# Patient Record
Sex: Female | Born: 1990 | Hispanic: No | Marital: Married | Smoking: Never smoker
Health system: Southern US, Community
[De-identification: ages and names within clinical notes are randomized; demographics above are authoritative.]

## PROBLEM LIST (undated history)

## (undated) ENCOUNTER — Inpatient Hospital Stay (HOSPITAL_COMMUNITY): Payer: Self-pay

## (undated) ENCOUNTER — Emergency Department (HOSPITAL_BASED_OUTPATIENT_CLINIC_OR_DEPARTMENT_OTHER): Admission: EM | Payer: Medicaid Other

## (undated) DIAGNOSIS — D649 Anemia, unspecified: Secondary | ICD-10-CM

## (undated) DIAGNOSIS — E559 Vitamin D deficiency, unspecified: Secondary | ICD-10-CM

## (undated) HISTORY — PX: NO PAST SURGERIES: SHX2092

---

## 2015-02-08 ENCOUNTER — Emergency Department (HOSPITAL_COMMUNITY)
Admission: EM | Admit: 2015-02-08 | Discharge: 2015-02-08 | Disposition: A | Discharge status: Home / Self Care | Payer: Self-pay | Attending: Emergency Medicine | Admitting: Emergency Medicine

## 2015-02-08 ENCOUNTER — Encounter (HOSPITAL_COMMUNITY): Payer: Self-pay | Admitting: Emergency Medicine

## 2015-02-08 DIAGNOSIS — K120 Recurrent oral aphthae: Secondary | ICD-10-CM | POA: Insufficient documentation

## 2015-02-08 DIAGNOSIS — K068 Other specified disorders of gingiva and edentulous alveolar ridge: Secondary | ICD-10-CM | POA: Insufficient documentation

## 2015-02-08 DIAGNOSIS — Z862 Personal history of diseases of the blood and blood-forming organs and certain disorders involving the immune mechanism: Secondary | ICD-10-CM | POA: Insufficient documentation

## 2015-02-08 DIAGNOSIS — G4730 Sleep apnea, unspecified: Secondary | ICD-10-CM | POA: Insufficient documentation

## 2015-02-08 HISTORY — DX: Anemia, unspecified: D64.9

## 2015-02-08 MED ORDER — MAGIC MOUTHWASH W/LIDOCAINE: 5.0000 mL | Freq: Three times a day (TID) | ORAL | Status: DC | PRN

## 2015-02-08 NOTE — ED Provider Notes (Signed)
CSN: [id number]     Arrival date & time 02/08/15  1011 History  By signing my name below, I, [name], attest that this documentation has been prepared under the direction and in the presence of Glendell Docker, NP. Electronically Signed: [name], ED Scribe. 02/08/2015. 10:27 AM.    No chief complaint on file.  The history is provided by the patient and a relative. No language interpreter was used.    HPI Comments: Peggy Savage is a 24 y.o. female who presents to the Emergency Department complaining of worsening, moderate, right lower dental pain with onset a few days ago. Pt notes worsening pain when eating and further notes associated trouble sleeping due to the pain. She also notes an associated, mild, subjective fever. Her friends note she has been taking OTC pain medications which have provided no relief. She denies any known drug allergies. Pt denies any other associated symptoms.   No past medical history on file. No past surgical history on file. No family history on file. Social History  Substance Use Topics  . Smoking status: Not on file  . Smokeless tobacco: Not on file  . Alcohol Use: Not on file   OB History    No data available     Review of Systems  Constitutional: Positive for fever (subjective).  Neurological: Positive for headaches.  Psychiatric/Behavioral: Positive for sleep disturbance.  All other systems reviewed and are negative.  Allergies  Review of patient's allergies indicates not on file.  Home Medications   Prior to Admission medications   Not on File   There were no vitals taken for this visit. Physical Exam  Constitutional: She is oriented to person, place, and time. She appears well-developed and well-nourished.  HENT:  Head: Normocephalic and atraumatic.  Right Ear: External ear normal.  Left Ear: External ear normal.  Mouth/Throat: No oropharyngeal exudate.    Neck: Normal range of motion. No tracheal deviation  present.  Cardiovascular: Normal rate.   Pulmonary/Chest: Effort normal. No respiratory distress.  Abdominal: Soft. There is no tenderness.  Musculoskeletal: Normal range of motion.  Neurological: She is alert and oriented to person, place, and time.  Skin: Skin is warm and dry. She is not diaphoretic.  Psychiatric: She has a normal mood and affect. Her behavior is normal.  Nursing note and vitals reviewed.  ED Course  Procedures  COORDINATION OF CARE: 10:24 AM Pt presents today due to right lower dental pain. Discussed treatment plan with pt at bedside . Return precautions noted. Pt agreed to plan.  Labs Review Labs Reviewed - No data to display  Imaging Review No results found.   EKG Interpretation None      MDM   Final diagnoses:  Canker sores oral    Pt given magic mouthwash for symptomatic relief. No dental infection noted. No pta  I personally performed the services described in this documentation, which was scribed in my presence. The recorded information has been reviewed and is accurate.    Glendell Docker, NP 02/08/15 [place] Yao, MD 02/08/15 [phone]

## 2015-02-08 NOTE — ED Notes (Signed)
Translator stated, she's had a toothache since Friday.

## 2015-02-08 NOTE — Discharge Instructions (Signed)
Canker Sores °Canker sores are small, painful sores that develop inside your mouth. They may also be called aphthous ulcers. You can get canker sores on the inside of your lips or cheeks, on your tongue, or anywhere inside your mouth. You can have just one canker sore or several of them. Canker sores cannot be passed from one person to another (noncontagious). These sores are different than the sores that you may get on the outside of your lips (cold sores or fever blisters). °Canker sores usually start as painful red bumps. Then they turn into small white, yellow, or gray ulcers that have red borders. The ulcers may be quite painful. The pain may be worse when you eat or drink. °CAUSES °The cause of this condition is not known. °RISK FACTORS °This condition is more likely to develop in: °· Women. °· People in their teens or 20s. °· Women who are having their menstrual period. °· People who are under a lot of emotional stress. °· People who do not get enough iron or B vitamins. °· People who have poor oral hygiene. °· People who have an injury inside the mouth. This can happen after having dental work or from chewing something hard. °SYMPTOMS °Along with the canker sore, symptoms may also include: °· Fever. °· Fatigue. °· Swollen lymph nodes in your neck. °DIAGNOSIS °This condition can be diagnosed based on your symptoms. Your health care provider will also examine your mouth. Your health care provider may also do tests if you get canker sores often or if they are very bad. Tests may include: °· Blood tests to rule out other causes of canker sores. °· Taking swabs from the sore to check for infection. °· Taking a small piece of skin from the sore (biopsy) to test it for cancer. °TREATMENT °Most canker sores clear up without treatment in about 10 days. Home care is usually the only treatment that you will need. Over-the-counter medicines can relieve discomfort. If you have severe canker sores, your health care  provider may prescribe: °· Numbing ointment to relieve pain. °· Vitamins. °· Steroid medicines. These may be given as: °¨ Oral pills. °¨ Mouth rinses. °¨ Gels. °· Antibiotic mouth rinse. °HOME CARE INSTRUCTIONS °· Apply, take, or use medicines only as directed by your health care provider. These include vitamins. °· If you were prescribed an antibiotic mouth rinse, finish all of it even if you start to feel better. °· Until the sores are healed: °¨ Do not drink coffee or citrus juices. °¨ Do not eat spicy or salty foods. °· Use a mild, over-the-counter mouth rinse as directed by your health care provider. °· Practice good oral hygiene. °¨ Floss your teeth every day. °¨ Brush your teeth with a soft brush twice each day. °SEEK MEDICAL CARE IF: °· Your symptoms do not get better after two weeks. °· You also have a fever or swollen glands. °· You get canker sores often. °· You have a canker sore that is getting larger. °· You cannot eat or drink due to your canker sores. °  °This information is not intended to replace advice given to you by your health care provider. Make sure you discuss any questions you have with your health care provider. °  °Document Released: 07/30/2010 Document Revised: 08/19/2014 Document Reviewed: 03/05/2014 °Elsevier Interactive Patient Education ©2016 Elsevier Inc. ° °

## 2015-02-08 NOTE — ED Notes (Signed)
Declined W/C at D/C and was escorted to lobby by RN. 

## 2015-03-14 ENCOUNTER — Emergency Department (HOSPITAL_COMMUNITY)
Admission: EM | Admit: 2015-03-14 | Discharge: 2015-03-14 | Disposition: A | Discharge status: Home / Self Care | Payer: Self-pay | Attending: Emergency Medicine | Admitting: Emergency Medicine

## 2015-03-14 ENCOUNTER — Encounter (HOSPITAL_COMMUNITY): Payer: Self-pay | Admitting: *Deleted

## 2015-03-14 DIAGNOSIS — Z862 Personal history of diseases of the blood and blood-forming organs and certain disorders involving the immune mechanism: Secondary | ICD-10-CM | POA: Insufficient documentation

## 2015-03-14 DIAGNOSIS — N63 Unspecified lump in unspecified breast: Secondary | ICD-10-CM

## 2015-03-14 NOTE — ED Notes (Signed)
Noticed a mass in her right breast today and it makes her stressed per family member

## 2015-03-14 NOTE — Discharge Instructions (Signed)
Breast Self-Awareness  Practicing breast self-awareness may pick up problems early, prevent significant medical complications, and possibly save your life. By practicing breast self-awareness, you can become familiar with how your breasts look and feel and if your breasts are changing. This allows you to notice changes early. It can also offer you some reassurance that your breast health is good. One way to learn what is normal for your breasts and whether your breasts are changing is to do a breast self-exam.   If you find a lump or something that was not present in the past, it is best to contact your caregiver right away. Other findings that should be evaluated by your caregiver include nipple discharge, especially if it is bloody; skin changes or reddening; areas where the skin seems to be pulled in (retracted); or new lumps and bumps. Breast pain is seldom associated with cancer (malignancy), but should also be evaluated by a caregiver.  HOW TO PERFORM A BREAST SELF-EXAM  The best time to examine your breasts is 5-7 days after your menstrual period is over. During menstruation, the breasts are lumpier, and it may be more difficult to pick up changes. If you do not menstruate, have reached menopause, or had your uterus removed (hysterectomy), you should examine your breasts at regular intervals, such as monthly. If you are breastfeeding, examine your breasts after a feeding or after using a breast pump. Breast implants do not decrease the risk for lumps or tumors, so continue to perform breast self-exams as recommended. Talk to your caregiver about how to determine the difference between the implant and breast tissue. Also, talk about the amount of pressure you should use during the exam. Over time, you will become more familiar with the variations of your breasts and more comfortable with the exam. A breast self-exam requires you to remove all your clothes above the waist.  1.  Look at your breasts and  nipples. Stand in front of a mirror in a room with good lighting. With your hands on your hips, push your hands firmly downward. Look for a difference in shape, contour, and size from one breast to the other (asymmetry). Asymmetry includes puckers, dips, or bumps. Also, look for skin changes, such as reddened or scaly areas on the breasts. Look for nipple changes, such as discharge, dimpling, repositioning, or redness.  2. Carefully feel your breasts. This is best done either in the shower or tub while using soapy water or when flat on your back. Place the arm (on the side of the breast you are examining) above your head. Use the pads (not the fingertips) of your three middle fingers on your opposite hand to feel your breasts. Start in the underarm area and use  inch (2 cm) overlapping circles to feel your breast. Use 3 different levels of pressure (light, medium, and firm pressure) at each circle before moving to the next circle. The light pressure is needed to feel the tissue closest to the skin. The medium pressure will help to feel breast tissue a little deeper, while the firm pressure is needed to feel the tissue close to the ribs. Continue the overlapping circles, moving downward over the breast until you feel your ribs below your breast. Then, move one finger-width towards the center of the body. Continue to use the  inch (2 cm) overlapping circles to feel your breast as you move slowly up toward the collar bone (clavicle) near the base of the neck. Continue the up and down exam  using all 3 pressures until you reach the middle of the chest. Do this with each breast, carefully feeling for lumps or changes. 3.  Keep a written record with breast changes or normal findings for each breast. By writing this information down, you do not need to depend only on memory for size, tenderness, or location. Write down where you are in your menstrual cycle, if you are still menstruating. Breast tissue can have some  lumps or thick tissue. However, see your caregiver if you find anything that concerns you.  SEEK MEDICAL CARE IF:  You see a change in shape, contour, or size of your breasts or nipples.  You see skin changes, such as reddened or scaly areas on the breasts or nipples.  You have an unusual discharge from your nipples.  You feel a new lump or unusually thick areas.  This information is not intended to replace advice given to you by your health care provider. Make sure you discuss any questions you have with your health care provider.  Document Released: 04/04/2005 Document Revised: 03/21/2012 Document Reviewed: 07/20/2011  Elsevier Interactive Patient Education 2016 [employer].    Emergency Department Resource Guide 1) Find a Doctor and Pay Out of Pocket Although you won't have to find out who is covered by your insurance plan, it is a good idea to ask around and get recommendations. You will then need to call the office and see if the doctor you have chosen will accept you as a new patient and what types of options they offer for patients who are self-pay. Some doctors offer discounts or will set up payment plans for their patients who do not have insurance, but you will need to ask so you aren't surprised when you get to your appointment.  2) Contact Your Local Health Department Not all health departments have doctors that can see patients for sick visits, but many do, so it is worth a call to see if yours does. If you don't know where your local health department is, you can check in your phone book. The CDC also has a tool to help you locate your state's health department, and many state websites also have listings of all of their local health departments.  3) Find a [facility] If your illness is not likely to be very severe or complicated, you may want to try a walk in clinic. These are popping up all over the country in pharmacies, drugstores, and shopping centers. They're usually  staffed by nurse practitioners or physician assistants that have been trained to treat common illnesses and complaints. They're usually fairly quick and inexpensive. However, if you have serious medical issues or chronic medical problems, these are probably not your best option.  No Primary Care Doctor: - Call Health Connect at  [phone] - they can help you locate a primary care doctor that  accepts your insurance, provides certain services, etc. - Physician Referral Service- [phone]  Chronic Pain Problems: Organization         Address  Phone   Notes  [facility]  [phone] Patients need to be referred by their primary care doctor.   Medication Assistance: Organization         Address  Phone   Notes  [facility] [address] [place], [place] [address] [phone] --Must be a resident of [facility] -- Must have NO insurance coverage whatsoever (no Medicaid/ Medicare, etc.) -- The pt.  MUST have a primary care doctor that directs their care regularly and follows them in the community   MedAssist  [phone]   [employer]  [phone]    Agencies that provide inexpensive medical care: Organization         Address  Phone   Notes  [place]  [phone]   [facility]    [phone]   [facility] [place], [place] [address] [phone]   [place] [address] [place] [phone]   Planned Parenthood    [phone]   [facility]    [phone]   [place] and [place] [address], [place] Phone:  [phone], Fax:  [phone] Hours of Operation:  9 am - 6 pm, M-F.  Also accepts Medicaid/Medicare and self-pay.  [facility] for [address], [address], [place] Phone: [phone], Fax: [phone]. Hours of  Operation:  8:30 am - 5:30 pm, M-F.  Also accepts Medicaid and self-pay.  [facility] [place] [address] [place] Phone: [phone]   [place], [place], [place] [phone], Ext. [phone] Mondays & Thursdays: 7-9 AM.  First 15 patients are seen on a first come, first serve basis.    [place] Providers:  Organization         Address  Phone   Notes  [facility] [address], [place] [phone] Also accepts self-pay patients.  [facility] [id number] [place], [place]  [phone]   [place], [address], [place] [phone]   [facility] [address], [place] [phone]   Lucianne Lei [address], [place]   [phone] Only accepts [place] Access [place] patients after they have their name applied to their card.   Self-Pay (no insurance) in [facility]:  Organization         Address  Phone   Notes  Sickle Cell Patients, [facility] [place] [phone]   [facility] [place] [phone]   [facility] [place]  [place], [employer], [place] [phone]   [facility]/Dr. Osei-Bonsu  [address], [place] [address], [place] [phone] Phone number for both [place] and [place] locations is the same.  [facility] [address], [place] [phone]   [facility] [address] [place] or [address] [phone]   [facility] [address], [place] [phone], phone; [phone], fax Sees patients 1st and 3rd Saturday of every month.  Must not qualify for public or private insurance (i.e. Medicaid, Medicare,  [place] Health Choice, Veterans' Benefits)  Household income should be no more than 200% of the poverty level The clinic cannot treat you if you are pregnant or think you are pregnant  Sexually transmitted diseases are not treated at the clinic.    Dental Care: Organization         Address  Phone  Notes  [facility] [address]  Mardene Speak [phone] Accepts children up to age 47 who are enrolled in Medicaid or [place]; pregnant women with a Medicaid card; and children who have applied for Medicaid or [place] Health Choice, but were declined, whose parents can pay a reduced fee at time of service.  [facility] Department of [facility]  [address], [place] [phone] Accepts children up to age 18 who are enrolled in [place] or [place]; pregnant women with a Medicaid card; and children who have applied for Medicaid or [place] Health Choice, but were declined, whose parents can pay a reduced fee at time of service.  [facility] Adult [facility] Access PROGRAM  [place] [phone] Patients are seen by appointment only. Walk-ins are not accepted. [place] will see patients 61 years of age and older. Monday - Tuesday (8am-5pm) Most Wednesdays (8:30-5pm) $30 per visit, cash only  [facility] Adult Dental Access PROGRAM  [address], [facility] [phone] Patients are seen by appointment only. Walk-ins are not accepted. [place] will see patients 69 years of age and older. One Wednesday Evening (Monthly: Volunteer Based).  $30 per visit, cash only  [place]  [phone] for adults; Children under age 43, call Graduate Pediatric Dentistry at [phone]. Children aged 12-14, please call [phone] to request a pediatric application.  Dental services are provided in all areas of dental care including fillings, crowns and bridges,  complete and partial dentures, implants, gum treatment, root canals, and extractions. Preventive care is also provided. Treatment is provided to both adults and children. Patients are selected via a lottery and there is often a waiting list.   [facility] [address] [place]  [phone] [email or web address]   [facility] [address] [place], [place] [phone], Ext. [phone] Second and Fourth Thursday of each month, opens at 6:30 AM; Clinic ends at 9 AM.  Patients are seen on a first-come first-served basis, and a limited number are seen during each clinic.   [facility]  [address] Hillard Danker [place], [place] [phone]   Eligibility Requirements You must have lived in [place], [place], or [place] counties for at least the last three months.   You cannot be eligible for state or federal sponsored [employer], including [employer], [place], or [employer].   You generally cannot be eligible for healthcare insurance through your employer.    How to apply: Eligibility screenings are held every Tuesday and Wednesday afternoon from 1:00 pm until 4:00 pm. You do not need an appointment for the interview!  [facility] [address], [place], [place]   [place]  [place] Department  [place]  [phone]    Behavioral Health Resources in the Community: Intensive Outpatient Programs Organization         Address  Phone  Notes  [place]. [address], [place], [place] [phone]   [facility]patient [address], [place], [place]   ADS: Alcohol & Drug Svcs [address] [place], [place]   [place] [address]  [place], [place] or [phone]   Substance Abuse Resources Organization          Address  Phone  Notes  Alcohol and Drug  Services  [place]  [phone]   The [place]   Chinita Pester  [phone]   Residential & Outpatient Substance Abuse Program  [phone]   Psychological Services Organization         Address  Phone  Notes  [place]  [place]  [phone]   [place] [address] [place] or [phone]    Mobile Crisis Teams Organization         Address  Phone  Notes  [facility], Mobile Crisis Care Unit  [phone]   [facility]  [address] [place], [place]   [place] [address] [place] [phone]    Self-Help/Support Groups Organization         Address  Phone             Notes  [place]. of [place] - variety of support groups  [place] Call for more information  Narcotics Anonymous (NA), [facility] [address], [employer] [place]  2 meetings at this location   Special educational needs teacher         Address  Phone  Notes  ASAP Residential Treatment [place],    [place]  [phone]   [facility]  [address] [place] [phone], [place], [place]   [place], [place] [phone] Admissions: 8am-3pm M-F  Incentives Substance [place] [address]    [place], [place] [phone]   [facility] [address] [place], [place], [place]   [facility] [address],  [place], [place]   [facility] - Intensive Outpatient [address] Kristeen [address], [place], [place]   [facility] ([place] [address]  [place], [place] [phone] or [phone]   [facility] Treatment Services (RTS) [address] [place], [place] Accepts Medicaid  [place] [address]  [place] [phone] Substance Abuse/Addiction Treatment   [place] [facility] Organization         Address  Phone  Notes  [facility]  [phone]   Domenic Schwab, PhD [address] Arlis Porta [place], [place]   [phone] or [phone]   [place], [place] [phone]   [facility] [address], [place], [place] [phone] Insurance/Medicaid/sponsorship through [facility] and Families [address] [place]                                    [place], [place] [phone] [address] [address], [place] [phone]    Dr. Adele Schilder  [phone]   [facility] Dept. 1) [address] [place] 2) [place] 3)  [place] [address], [place] [phone]  [phone]   [place] [phone] or [phone] (After Hours)

## 2015-03-14 NOTE — ED Provider Notes (Signed)
CSN: [id number]     Arrival date & time 03/14/15  2158 History  By signing my name below, I, Peggy Savage, attest that this documentation has been prepared under the direction and in the presence of Gloriann Loan, PA-C. Electronically Signed: Rayna Savage, ED Scribe. 03/14/2015. 10:36 PM.   Chief Complaint  Patient presents with  . Breast Mass   The history is provided by a friend and the patient. The history is limited by a language barrier. A language interpreter was used (Patient speaks french and arabic).    HPI Comments: Peggy Savage is a 24 y.o. female with no pertinent medical hx who presents to the Emergency Department complaining of a lump to her right breast which she felt this evening. She denies any pain to the affected breast. Pt denies having done anything for her symptoms. No aggravating or alleviating factors.  Pt denies any FHx of breast cancer. She notes her LNMP was around 11/3. She denies nipple discharge or retractions, skin color change, drainage, redness, fever, chills, or vaginal complaints.  PCP: None  Past Medical History  Diagnosis Date  . Anemia    History reviewed. No pertinent past surgical history. No family history on file. Social History  Substance Use Topics  . Smoking status: Never Smoker   . Smokeless tobacco: Never Used  . Alcohol Use: No   OB History    No data available     Review of Systems A complete 10 system review of systems was obtained and all systems are negative except as noted in the HPI and PMH.   Allergies  Review of patient's allergies indicates no known allergies.  Home Medications   Prior to Admission medications   Medication Sig Start Date End Date Taking? Authorizing Provider  magic mouthwash w/lidocaine SOLN Take 5 mLs by mouth 3 (three) times daily as needed for mouth pain. 02/08/15   Glendell Docker, NP   BP 129/78 mmHg  Pulse 67  Temp(Src) 97.5 F (36.4 C)  Resp 15  SpO2 99%  LMP  02/18/2015 Physical Exam  Constitutional: She is oriented to person, place, and time. She appears well-developed and well-nourished.  HENT:  Head: Normocephalic and atraumatic.  Mouth/Throat: No oropharyngeal exudate.  Eyes: Conjunctivae are normal.  Neck: Normal range of motion. No tracheal deviation present.  Cardiovascular: Normal rate, regular rhythm and normal heart sounds.   Pulmonary/Chest: Effort normal and breath sounds normal. No respiratory distress.  Abdominal: She exhibits no distension.  Genitourinary: No breast swelling, tenderness, discharge or bleeding. Pelvic exam was performed with patient prone.  Small <1 cm mobile nontender mass palpated in right breast at approximately 11 o'clock in LUQ closer to the nipple.  Right breast appears normal, no skin changes, nipple discharge or retractions.  Musculoskeletal: Normal range of motion.  Neurological: She is alert and oriented to person, place, and time.  Skin: Skin is warm, dry and intact. Bruising noted. No abrasion, no burn, no ecchymosis, no laceration, no lesion and no rash noted. She is not diaphoretic. No erythema.  Psychiatric: She has a normal mood and affect. Her behavior is normal.  Nursing note and vitals reviewed.  ED Course  Procedures  DIAGNOSTIC STUDIES: Oxygen Saturation is 99% on RA, normal by my interpretation.    COORDINATION OF CARE: 10:39 PM Pt presents today due to a lump in her right breast. Discussed treatment plan with pt at bedside including a referral to an OB/GYN as well as a resource list for establishing a PCP.  Return precautions noted. Pt agreed to plan.  Labs Review Labs Reviewed - No data to display  Imaging Review No results found.   EKG Interpretation None     MDM   Final diagnoses:  Breast mass in female     Patient presents with right breast mass she noticed this evening.  No nipple discharge, skin color changes, or pain.  VSS, NAD.  On exam, mobile nontender mass palpated.   No erythema, warmth, induration, or signs of infection.  Fibroadenoma vs cyst vs fibrocystic changes vs mass.  No indication for emergent imaging.  Follow up with [facility].  Evaluation does not show pathology requring ongoing emergent intervention or admission. Pt is hemodynamically stable and mentating appropriately. Discussed findings/results and plan with patient/guardian, who agrees with plan. All questions answered. Return precautions discussed and outpatient follow up given.   I personally performed the services described in this documentation, which was scribed in my presence. The recorded information has been reviewed and is accurate.    Gloriann Loan, PA-C 03/14/15 2253  Merrily Pew, MD 03/16/15 [phone]

## 2015-03-14 NOTE — ED Notes (Signed)
Pt is in stable condition upon d/c and ambulates from ED. 

## 2015-03-24 ENCOUNTER — Other Ambulatory Visit (HOSPITAL_COMMUNITY): Payer: Self-pay | Admitting: *Deleted

## 2015-03-24 DIAGNOSIS — N6310 Unspecified lump in the right breast, unspecified quadrant: Secondary | ICD-10-CM

## 2015-03-26 ENCOUNTER — Encounter (HOSPITAL_COMMUNITY): Payer: Self-pay

## 2015-03-26 ENCOUNTER — Ambulatory Visit (HOSPITAL_COMMUNITY)
Admission: RE | Admit: 2015-03-26 | Discharge: 2015-03-26 | Disposition: A | Discharge status: Home / Self Care | Payer: Self-pay | Source: Ambulatory Visit | Attending: Obstetrics and Gynecology | Admitting: Obstetrics and Gynecology

## 2015-03-26 VITALS — BP 110/72 | Temp 98.0°F | Ht 66.93 in | Wt 171.0 lb

## 2015-03-26 DIAGNOSIS — N6315 Unspecified lump in the right breast, overlapping quadrants: Secondary | ICD-10-CM

## 2015-03-26 DIAGNOSIS — Z01419 Encounter for gynecological examination (general) (routine) without abnormal findings: Secondary | ICD-10-CM

## 2015-03-26 DIAGNOSIS — N6310 Unspecified lump in the right breast, unspecified quadrant: Secondary | ICD-10-CM

## 2015-03-26 NOTE — Patient Instructions (Signed)
Educational materials on self breast awareness given. Explained to [employer] that  [facility] will cover Pap smears every 3 years unless has a history of abnormal Pap smears. Referred patient to the [facility] for right breast ultrasound. Appointment scheduled for Friday, March 27, 2015 at 1520. Patient aware of appointment and will reschedule if unable to make it. Let patient know will follow up with her within the next couple weeks with results of Pap smear by phone. Delva Bruhl verbalized understanding.  Litzy Dicker, Arvil Chaco, RN 4:54 PM

## 2015-03-26 NOTE — Progress Notes (Signed)
Complaints of right breast lump x 20 days.  Pap Smear:  Completed Pap smear today. Per patient she has never had a Pap smear. No Pap smear results in EPIC.  Physical exam: Breasts Breasts symmetrical. No skin abnormalities bilateral breasts. No nipple retraction bilateral breasts. No nipple discharge bilateral breasts. No lymphadenopathy. No lumps palpated left breast. Palpated a moveable lump within the right breast at 12 o'clock 5 cm from the nipple. No complaints of pain or tenderness on exam. Referred patient to the [facility] for right breast ultrasound. Appointment scheduled for Friday, March 27, 2015 at 1520.         Pelvic/Bimanual   Ext Genitalia No lesions, no swelling and no discharge observed on external genitalia.         Vagina Vagina pink and normal texture. No lesions or discharge observed in vagina.          Cervix Cervix is present. Cervix pink and of normal texture. No discharge observed on cervix.    Uterus Uterus is present and palpable. Uterus in normal position and normal size.       Adnexae Bilateral ovaries present and palpable. No tenderness on palpation.        Rectovaginal No rectal exam completed today since patient had no rectal complaints. No skin abnormalities observed on exam.  Used interpreter Bettey Mare.

## 2015-03-27 ENCOUNTER — Other Ambulatory Visit: Payer: Self-pay

## 2015-03-27 ENCOUNTER — Ambulatory Visit
Admission: RE | Admit: 2015-03-27 | Discharge: 2015-03-27 | Disposition: A | Discharge status: Home / Self Care | Payer: No Typology Code available for payment source | Source: Ambulatory Visit | Attending: Obstetrics and Gynecology | Admitting: Obstetrics and Gynecology

## 2015-03-27 DIAGNOSIS — N6310 Unspecified lump in the right breast, unspecified quadrant: Secondary | ICD-10-CM

## 2015-03-30 LAB — CYTOLOGY - PAP

## 2015-04-19 NOTE — L&D Delivery Note (Addendum)
Delivery Note Pt pushed x 1 hour 20 minutes w/ repetitive variables, mod variability. At 9:33 PM a viable and healthy female was delivered via Vaginal, Spontaneous Delivery (Presentation: ROA).  APGAR: 8, 9; weight 8 lb 2 oz (3685 g).   Placenta status: manual removal due to partial cord avulsion, intact. Cord: 3VC, loose double nuchal cord, reduced with the following complications: None.  Cord pH: NA  Anesthesia: Epidural  Episiotomy: None Lacerations: 1st degree;Periurethral Suture Repair: 3.0 vicryl rapide Est. Blood Loss (mL): 400  Mom to postpartum.  Baby to Couplet care / Skin to Skin. Placenta to: BS Feeding: Breast Circ: NA Contraception: POPs  [place] 12/01/2015, 11:19 PM

## 2015-05-12 ENCOUNTER — Ambulatory Visit (INDEPENDENT_AMBULATORY_CARE_PROVIDER_SITE_OTHER): Payer: Medicaid Other | Admitting: Certified Nurse Midwife

## 2015-05-12 ENCOUNTER — Encounter: Payer: Self-pay | Admitting: Certified Nurse Midwife

## 2015-05-12 VITALS — BP 136/79 | HR 112 | Temp 98.0°F | Wt 164.0 lb

## 2015-05-12 DIAGNOSIS — O219 Vomiting of pregnancy, unspecified: Secondary | ICD-10-CM

## 2015-05-12 DIAGNOSIS — Z3401 Encounter for supervision of normal first pregnancy, first trimester: Secondary | ICD-10-CM

## 2015-05-12 DIAGNOSIS — Z3493 Encounter for supervision of normal pregnancy, unspecified, third trimester: Secondary | ICD-10-CM | POA: Insufficient documentation

## 2015-05-12 LAB — POCT URINALYSIS DIPSTICK
Bilirubin, UA: NEGATIVE
Glucose, UA: NEGATIVE
Ketones, UA: NEGATIVE
Leukocytes, UA: NEGATIVE
Nitrite, UA: NEGATIVE
PH UA: 5
PROTEIN UA: NEGATIVE
RBC UA: NEGATIVE
SPEC GRAV UA: 1.02
UROBILINOGEN UA: NEGATIVE

## 2015-05-12 MED ORDER — DOXYLAMINE-PYRIDOXINE 10-10 MG PO TBEC: DELAYED_RELEASE_TABLET | ORAL | Status: DC

## 2015-05-12 MED ORDER — VITAFOL GUMMIES 3.33-0.333-34.8 MG PO CHEW: 3.0000 | CHEWABLE_TABLET | Freq: Every day | ORAL | Status: DC

## 2015-05-12 NOTE — Progress Notes (Signed)
Subjective:    Peggy Savage is being seen today for her first obstetrical visit.  Is from [place].  This is a planned pregnancy. She is at [redacted]w[redacted]d gestation. Her obstetrical history is significant for recent US transition. Relationship with FOB: significant other, living together. Patient does intend to breast feed. Pregnancy history fully reviewed.  Here for exam with interpreter Drue Second.   The information documented in the HPI was reviewed and verified.  Menstrual History: OB History    Gravida Para Term Preterm AB TAB SAB Ectopic Multiple Living   1 0 0 0 0 0 0 0 0 0       Menarche age: 27-25 years of age  Patient's last menstrual period was 02/21/2015 (exact date).    Past Medical History  Diagnosis Date  . Anemia     Past Surgical History  Procedure Laterality Date  . No past surgeries       (Not in a hospital admission) No Known Allergies  Social History  Substance Use Topics  . Smoking status: Never Smoker   . Smokeless tobacco: Never Used  . Alcohol Use: No    Family History  Problem Relation Age of Onset  . Diabetes Mother   . Hypertension Mother   . Diabetes Father   . Hypertension Father      Review of Systems Constitutional: negative for weight loss Gastrointestinal: + for nausea and vomiting Genitourinary:negative for genital lesions and vaginal discharge and dysuria Musculoskeletal:negative for back pain Behavioral/Psych: negative for abusive relationship, depression, illegal drug usage and tobacco use    Objective:    BP 136/79 mmHg  Pulse 112  Temp(Src) 98 F (36.7 C)  Wt 164 lb (74.39 kg)  LMP 02/21/2015 (Exact Date) General Appearance:    Alert, cooperative, no distress, appears stated age  Head:    Normocephalic, without obvious abnormality, atraumatic  Eyes:    PERRL, conjunctiva/corneas clear, EOM's intact, fundi    benign, both eyes  Ears:    Normal TM's and external ear canals, both ears  Nose:   Nares normal, septum  midline, mucosa normal, no drainage    or sinus tenderness  Throat:   Lips, mucosa, and tongue normal; teeth and gums normal  Neck:   Supple, symmetrical, trachea midline, no adenopathy;    thyroid:  no enlargement/tenderness/nodules; no carotid   bruit or JVD  Back:     Symmetric, no curvature, ROM normal, no CVA tenderness  Lungs:     Clear to auscultation bilaterally, respirations unlabored  Chest Wall:    No tenderness or deformity   Heart:    Regular rate and rhythm, S1 and S2 normal, no murmur, rub   or gallop  Breast Exam:    Slight right breast tenderness with 10 o'clock palpable mass (documented with breast center & breast center following), no nipple abnormality. Left breast normal.   Abdomen:     Soft, non-tender, bowel sounds active all four quadrants,    no masses, no organomegaly  Genitalia:    Normal female without lesion, discharge or tenderness  Extremities:   Extremities normal, atraumatic, no cyanosis or edema  Pulses:   2+ and symmetric all extremities  Skin:   Skin color, texture, turgor normal, no rashes or lesions  Lymph nodes:   Cervical, supraclavicular, and axillary nodes normal  Neurologic:   CNII-XII intact, normal strength, sensation and reflexes    throughout     Cervix: long, thick, closed and posterior.  Fetal heart activity seen with  bedside US.  Fetal size appears smaller than 11 weeks.      Lab Review Urine pregnancy test Labs reviewed yes Radiologic studies reviewed yes Assessment:    Pregnancy at [redacted]w[redacted]d weeks   N&V in early pregnancy  Breast mass in female  Plan:     Prenatal vitamins.  Counseling provided regarding continued use of seat belts, cessation of alcohol consumption, smoking or use of illicit drugs; infection precautions i.e., influenza/TDAP immunizations, toxoplasmosis,CMV, parvovirus, listeria and varicella; workplace safety, exercise during pregnancy; routine dental care, safe medications, sexual activity, hot tubs, saunas, pools,  travel, caffeine use, fish and methlymercury, potential toxins, hair treatments, varicose veins Weight gain recommendations per IOM guidelines reviewed: underweight/BMI< 18.5--> gain 28 - 40 lbs; normal weight/BMI 18.5 - 24.9--> gain 25 - 35 lbs; overweight/BMI 25 - 29.9--> gain 15 - 25 lbs; obese/BMI >30->gain  11 - 20 lbs Problem list reviewed and updated. FIRST/CF mutation testing/NIPT/QUAD SCREEN/fragile X/Ashkenazi Jewish population testing/Spinal muscular atrophy discussed: requested. Role of ultrasound in pregnancy discussed; fetal survey: requested. Amniocentesis discussed: not indicated. VBAC calculator score: VBAC consent form provided Meds ordered this encounter  Medications  . Prenatal Multivit-Min-Fe-FA (PRENATAL VITAMINS PO)    Sig: Take by mouth.  . Doxylamine-Pyridoxine (DICLEGIS) 10-10 MG TBEC    Sig: Take 1 tablet with breakfast and lunch.  Take 2 tablets at bedtime.    Dispense:  100 tablet    Refill:  4  . Prenatal Vit-Fe Phos-FA-Omega (VITAFOL GUMMIES) 3.33-0.333-34.8 MG CHEW    Sig: Chew 3 tablets by mouth daily.    Dispense:  90 tablet    Refill:  12   Orders Placed This Encounter  Procedures  . Culture, OB Urine  . SureSwab, Vaginosis/Vaginitis Plus  . US OB Comp Less 14 Wks    Standing Status: Future     Number of Occurrences:      Standing Expiration Date: 07/09/2016    Order Specific Question:  Reason for Exam (SYMPTOM  OR DIAGNOSIS REQUIRED)    Answer:  viability, dating    Order Specific Question:  Preferred imaging location?    Answer:  Internal  . Obstetric panel  . HIV antibody  . Hemoglobinopathy evaluation  . Varicella zoster antibody, IgG  . VITAMIN D 25 Hydroxy (Vit-D Deficiency, Fractures)  . POCT urinalysis dipstick    Follow up in 4 weeks. 50% of 30 min visit spent on counseling and coordination of care.

## 2015-05-13 LAB — OBSTETRIC PANEL
ANTIBODY SCREEN: NEGATIVE
BASOS ABS: 0 10*3/uL (ref 0.0–0.1)
BASOS PCT: 0 % (ref 0–1)
EOS PCT: 0 % (ref 0–5)
Eosinophils Absolute: 0 10*3/uL (ref 0.0–0.7)
HEMATOCRIT: 39 % (ref 36.0–46.0)
HEP B S AG: NEGATIVE
Hemoglobin: 13 g/dL (ref 12.0–15.0)
LYMPHS PCT: 13 % (ref 12–46)
Lymphs Abs: 1 10*3/uL (ref 0.7–4.0)
MCH: 30 pg (ref 26.0–34.0)
MCHC: 33.3 g/dL (ref 30.0–36.0)
MCV: 90.1 fL (ref 78.0–100.0)
MPV: 10 fL (ref 8.6–12.4)
Monocytes Absolute: 0.7 10*3/uL (ref 0.1–1.0)
Monocytes Relative: 10 % (ref 3–12)
NEUTROS ABS: 5.7 10*3/uL (ref 1.7–7.7)
Neutrophils Relative %: 77 % (ref 43–77)
PLATELETS: 294 10*3/uL (ref 150–400)
RBC: 4.33 MIL/uL (ref 3.87–5.11)
RDW: 14 % (ref 11.5–15.5)
Rh Type: POSITIVE
Rubella: 5.34 Index — ABNORMAL HIGH (ref <0.90)
WBC: 7.4 10*3/uL (ref 4.0–10.5)

## 2015-05-13 LAB — VARICELLA ZOSTER ANTIBODY, IGG: VARICELLA IGG: 712.4 {index} — AB (ref <135.00)

## 2015-05-13 LAB — HIV ANTIBODY (ROUTINE TESTING W REFLEX): HIV 1&2 Ab, 4th Generation: NONREACTIVE

## 2015-05-13 LAB — VITAMIN D 25 HYDROXY (VIT D DEFICIENCY, FRACTURES): VIT D 25 HYDROXY: 16 ng/mL — AB (ref 30–100)

## 2015-05-14 LAB — CULTURE, OB URINE
Colony Count: NO GROWTH
ORGANISM ID, BACTERIA: NO GROWTH

## 2015-05-15 ENCOUNTER — Telehealth: Payer: Self-pay | Admitting: *Deleted

## 2015-05-15 LAB — SURESWAB, VAGINOSIS/VAGINITIS PLUS
ATOPOBIUM VAGINAE: NOT DETECTED Log (cells/mL)
C. ALBICANS, DNA: NOT DETECTED
C. TRACHOMATIS RNA, TMA: NOT DETECTED
C. glabrata, DNA: NOT DETECTED
C. parapsilosis, DNA: NOT DETECTED
C. tropicalis, DNA: NOT DETECTED
GARDNERELLA VAGINALIS: 5.7 Log (cells/mL)
LACTOBACILLUS SPECIES: 7.7 Log (cells/mL)
MEGASPHAERA SPECIES: NOT DETECTED Log (cells/mL)
N. gonorrhoeae RNA, TMA: NOT DETECTED
T. VAGINALIS RNA, QL TMA: NOT DETECTED

## 2015-05-15 LAB — HEMOGLOBINOPATHY EVALUATION
HEMOGLOBIN OTHER: 0 %
Hgb A2 Quant: 3 % (ref 2.2–3.2)
Hgb A: 97 % (ref 96.8–97.8)
Hgb F Quant: 0 % (ref 0.0–2.0)
Hgb S Quant: 0 %

## 2015-05-15 NOTE — Telephone Encounter (Signed)
diclegis approval has been entered and approved. Pharmacy aware.

## 2015-05-21 ENCOUNTER — Ambulatory Visit (INDEPENDENT_AMBULATORY_CARE_PROVIDER_SITE_OTHER): Payer: Medicaid Other

## 2015-05-21 DIAGNOSIS — O3680X1 Pregnancy with inconclusive fetal viability, fetus 1: Secondary | ICD-10-CM | POA: Diagnosis not present

## 2015-05-21 DIAGNOSIS — Z3401 Encounter for supervision of normal first pregnancy, first trimester: Secondary | ICD-10-CM

## 2015-06-09 ENCOUNTER — Ambulatory Visit (INDEPENDENT_AMBULATORY_CARE_PROVIDER_SITE_OTHER): Payer: Medicaid Other | Admitting: Certified Nurse Midwife

## 2015-06-09 VITALS — Wt 167.0 lb

## 2015-06-09 DIAGNOSIS — Z3402 Encounter for supervision of normal first pregnancy, second trimester: Secondary | ICD-10-CM

## 2015-06-09 LAB — POCT URINALYSIS DIPSTICK
Bilirubin, UA: NEGATIVE
Blood, UA: NEGATIVE
Glucose, UA: NEGATIVE
KETONES UA: NEGATIVE
LEUKOCYTES UA: NEGATIVE
NITRITE UA: NEGATIVE
PH UA: 5.5
PROTEIN UA: NEGATIVE
Spec Grav, UA: 1.02
Urobilinogen, UA: NEGATIVE

## 2015-06-09 NOTE — Progress Notes (Signed)
  Subjective:    Peggy Savage is a 25 y.o. female being seen today for her obstetrical visit. She is at [redacted]w[redacted]d gestation. Patient reports: backache, no bleeding, no cramping, no leaking and dental pain, states that with the Diclegis she is not vomiting.  here for exam with intrepreter.  Problem List Items Addressed This Visit    None    Visit Diagnoses    Encounter for supervision of normal first pregnancy in second trimester    -  Primary    Relevant Orders    POCT urinalysis dipstick (Completed)    US OB Comp + 14 Wk    AFP, Quad Screen      Patient Active Problem List   Diagnosis Date Noted  . Supervision of normal first pregnancy in first trimester 05/12/2015    Objective:     Wt 167 lb (75.751 kg)  LMP 02/21/2015 (Exact Date) Uterine Size: Below umbilicus     Assessment:    Pregnancy @ [redacted]w[redacted]d  weeks Doing well    Plan:    Problem list reviewed and updated. Labs reviewed.  Follow up in 4 weeks. FIRST/CF mutation testing/NIPT/QUAD SCREEN/fragile X/Ashkenazi Jewish population testing/Spinal muscular atrophy discussed: ordered. Role of ultrasound in pregnancy discussed; fetal survey: ordered. Amniocentesis discussed: not indicated. 50% of 20 minute visit spent on counseling and coordination of care.

## 2015-06-10 LAB — AFP, QUAD SCREEN
AFP: 31.6 ng/mL
Age Alone: 1:976 {titer}
CURR GEST AGE: 15.5 wks.days
HCG TOTAL: 71.29 [IU]/mL
INH: 208.2 pg/mL
INTERPRETATION-AFP: NEGATIVE
MOM FOR INH: 1.22
MoM for AFP: 1.09
MoM for hCG: 1.73
OPEN SPINA BIFIDA: NEGATIVE
Tri 18 Scr Risk Est: NEGATIVE
Trisomy 18 (Edward) Syndrome Interp.: 1:117000 {titer}
UE3 MOM: 1.11
uE3 Value: 0.83 ng/mL

## 2015-07-07 ENCOUNTER — Encounter: Payer: Medicaid Other | Admitting: Certified Nurse Midwife

## 2015-07-09 ENCOUNTER — Ambulatory Visit (INDEPENDENT_AMBULATORY_CARE_PROVIDER_SITE_OTHER): Payer: Medicaid Other | Admitting: Certified Nurse Midwife

## 2015-07-09 ENCOUNTER — Ambulatory Visit (INDEPENDENT_AMBULATORY_CARE_PROVIDER_SITE_OTHER): Payer: Medicaid Other

## 2015-07-09 VITALS — BP 118/74 | HR 85 | Temp 98.5°F | Wt 173.0 lb

## 2015-07-09 DIAGNOSIS — Z3402 Encounter for supervision of normal first pregnancy, second trimester: Secondary | ICD-10-CM

## 2015-07-09 DIAGNOSIS — Z36 Encounter for antenatal screening of mother: Secondary | ICD-10-CM | POA: Diagnosis not present

## 2015-07-09 LAB — POCT URINALYSIS DIPSTICK
Bilirubin, UA: NEGATIVE
GLUCOSE UA: NEGATIVE
KETONES UA: NEGATIVE
Leukocytes, UA: NEGATIVE
Nitrite, UA: NEGATIVE
RBC UA: NEGATIVE
Spec Grav, UA: 1.025
UROBILINOGEN UA: NEGATIVE
pH, UA: 5

## 2015-07-09 NOTE — Progress Notes (Signed)
Subjective:    Peggy Savage is a 25 y.o. female being seen today for her obstetrical visit. She is at [redacted]w[redacted]d gestation. Patient reports: no complaints . Fetal movement: normal.  Here for exam with interpreter.    Problem List Items Addressed This Visit    None     Patient Active Problem List   Diagnosis Date Noted  . Supervision of normal first pregnancy in first trimester 05/12/2015   Objective:    BP 118/74 mmHg  Pulse 85  Temp(Src) 98.5 F (36.9 C)  Wt 173 lb (78.472 kg)  LMP 02/21/2015 (Exact Date) FHT: 145 BPM  Uterine Size: size equals dates     Assessment:    Pregnancy @ [redacted]w[redacted]d    Plan:    OBGCT: discussed. Signs and symptoms of preterm labor: discussed.  Labs, problem list reviewed and updated 2 hr GTT planned Follow up in 4 weeks.

## 2015-07-21 ENCOUNTER — Other Ambulatory Visit: Payer: Self-pay | Admitting: Certified Nurse Midwife

## 2015-08-04 ENCOUNTER — Ambulatory Visit (INDEPENDENT_AMBULATORY_CARE_PROVIDER_SITE_OTHER): Payer: Medicaid Other | Admitting: Certified Nurse Midwife

## 2015-08-04 VITALS — BP 120/75 | HR 87 | Temp 98.2°F | Wt 178.6 lb

## 2015-08-04 DIAGNOSIS — Z3492 Encounter for supervision of normal pregnancy, unspecified, second trimester: Secondary | ICD-10-CM

## 2015-08-04 DIAGNOSIS — R0989 Other specified symptoms and signs involving the circulatory and respiratory systems: Secondary | ICD-10-CM

## 2015-08-04 LAB — POCT URINALYSIS DIPSTICK
BILIRUBIN UA: NEGATIVE
Blood, UA: NEGATIVE
GLUCOSE UA: NEGATIVE
KETONES UA: NEGATIVE
LEUKOCYTES UA: NEGATIVE
NITRITE UA: NEGATIVE
Protein, UA: NEGATIVE
Spec Grav, UA: 1.01
Urobilinogen, UA: NEGATIVE
pH, UA: 6

## 2015-08-04 NOTE — Progress Notes (Signed)
Subjective:    Peggy Savage is a 25 y.o. female being seen today for her obstetrical visit. She is at [redacted]w[redacted]d gestation. Patient reports: backache, no bleeding, no cramping and no leaking. Reports increased heart racing at rest, denies any cardiac hx, states that this occurred before pregnancy, but seems to be getting worse especially at night.  Fetal movement: normal.  Problem List Items Addressed This Visit    None    Visit Diagnoses    Prenatal care, second trimester    -  Primary    Relevant Orders    POCT urinalysis dipstick (Completed)      Patient Active Problem List   Diagnosis Date Noted  . Supervision of normal first pregnancy in first trimester 05/12/2015   Objective:    BP 120/75 mmHg  Pulse 87  Temp(Src) 98.2 F (36.8 C)  Wt 178 lb 9.6 oz (81.012 kg)  LMP 02/21/2015 (Exact Date) FHT: 155 BPM  Uterine Size: size equals dates   Cardiac: RRR, no murmur, gallops or rubs noted.   Assessment:    Pregnancy @ [redacted]w[redacted]d    ?cardiac dysrhythmia with tachycardia  Plan:   Cardiology consult  OBGCT: discussed and ordered for next visit. Signs and symptoms of preterm labor: discussed.  Labs, problem list reviewed and updated 2 hr GTT planned Follow up in 4 weeks.

## 2015-08-04 NOTE — Progress Notes (Signed)
Patient c/o back that comes and goes. Patient feels her heart racing more at night- she had this happening before pregnancy.

## 2015-08-12 ENCOUNTER — Encounter: Payer: Self-pay | Admitting: Cardiology

## 2015-08-12 ENCOUNTER — Ambulatory Visit (INDEPENDENT_AMBULATORY_CARE_PROVIDER_SITE_OTHER): Payer: Medicaid Other | Admitting: Cardiology

## 2015-08-12 VITALS — BP 120/68 | HR 87 | Ht 70.0 in | Wt 179.0 lb

## 2015-08-12 DIAGNOSIS — R002 Palpitations: Secondary | ICD-10-CM | POA: Diagnosis not present

## 2015-08-12 NOTE — Patient Instructions (Signed)
Medication Instructions:  Your physician recommends that you continue on your current medications as directed. Please refer to the Current Medication list given to you today.   Labwork: None ordered   Testing/Procedures: Your physician has recommended that you wear an event monitor. Event monitors are medical devices that record the heart's electrical activity. Doctors most often [place] these monitors to diagnose arrhythmias. Arrhythmias are problems with the speed or rhythm of the heartbeat. The monitor is a small, portable device. You can wear one while you do your normal daily activities. This is usually used to diagnose what is causing palpitations/syncope (passing out).    Follow-Up: Your physician recommends that you schedule a follow-up appointment as needed.  Will call with monitor results   Any Other Special Instructions Will Be Listed Below (If Applicable).     If you need a refill on your cardiac medications before your next appointment, please call your pharmacy.

## 2015-08-12 NOTE — Progress Notes (Signed)
Electrophysiology Office Note   Date:  08/12/2015   ID:  Peggy Savage, DOB [birth year], MRN [id number]  PCP:  No PCP Per Patient  Cardiologist:   Philippa Vessey Meredith Leeds, MD    Chief Complaint  Patient presents with  . Advice Only  . Palpitations     History of Present Illness: Peggy Savage is a 25 y.o. female who presents today for electrophysiology evaluation.   She presents after seeing her OB/GYN and complaining of increased HR at rest.  This occurred prior to her pregnancy, but has gotten worse since becoming pregnant.  She is currently at 25 weeks.She says that she gets the palpitations most nights, but there are nights that she feels well. At times they wake her up. They occur most often when she is resting. She is found no exacerbating factors. She says that she does not notice them if she tries to distract herself. She gets some shortness of breath associated with the palpitations. She does not get chest pain. She has been having these for many years; her palpitations have not changed in character since she has been pregnant.   Today, she denies symptoms of palpitations, chest pain, shortness of breath, orthopnea, PND, lower extremity edema, claudication, dizziness, presyncope, syncope, bleeding, or neurologic sequela. The patient is tolerating medications without difficulties and is otherwise without complaint today.    Past Medical History  Diagnosis Date  . Anemia    Past Surgical History  Procedure Laterality Date  . No past surgeries       Current Outpatient Prescriptions  Medication Sig Dispense Refill  . Prenatal Vit-Fe Phos-FA-Omega (VITAFOL GUMMIES) 3.33-0.333-34.8 MG CHEW Chew 3 tablets by mouth daily. 90 tablet 12   No current facility-administered medications for this visit.    Allergies:   Review of patient's allergies indicates no known allergies.   Social History:  The patient  reports that she has never smoked. She has never used smokeless  tobacco. She reports that she does not drink alcohol or use illicit drugs.   Family History:  The patient's family history includes Diabetes in her father and mother; Hypertension in her father and mother.    ROS:  Please see the history of present illness.   Otherwise, review of systems is positive for waking up SOB, anxiety.   All other systems are reviewed and negative.    PHYSICAL EXAM: VS:  BP 120/68 mmHg  Pulse 87  Ht 5\' 10"  (1.778 m)  Wt 179 lb (81.194 kg)  BMI 25.68 kg/m2  LMP 02/21/2015 (Exact Date) , BMI Body mass index is 25.68 kg/(m^2). GEN: Well nourished, well developed, in no acute distress HEENT: normal Neck: no JVD, carotid bruits, or masses Cardiac: RRR; no murmurs, rubs, or gallops,no edema  Respiratory:  clear to auscultation bilaterally, normal work of breathing GI: soft, nontender, nondistended, + BS MS: no deformity or atrophy Skin: warm and dry Neuro:  Strength and sensation are intact Psych: euthymic mood, full affect  EKG:  EKG is ordered today. The ekg ordered today shows sinus rhythm, rate 87   Recent Labs: 05/12/2015: Hemoglobin 13.0; Platelets 294    Lipid Panel  No results found for: CHOL, TRIG, HDL, CHOLHDL, VLDL, LDLCALC, LDLDIRECT   Wt Readings from Last 3 Encounters:  08/12/15 179 lb (81.194 kg)  08/04/15 178 lb 9.6 oz (81.012 kg)  07/09/15 173 lb (78.472 kg)      Other studies Reviewed: Additional studies/ records that were reviewed today include: Epic notes  ASSESSMENT AND PLAN:  1.  Palpitations: Palpitations occur mainly at night. They do not occur every day. Her EKG is normal today and therefore it is difficult to determine the cause of her palpitations based off of that. We Emera Bussie order a 30 day monitor to determine the cause of her palpitations. We Jivan Symanski follow-up as needed pending the monitor.    Current medicines are reviewed at length with the patient today.   The patient does not have concerns regarding her medicines.   The following changes were made today:  none  Labs/ tests ordered today include:  Orders Placed This Encounter  Procedures  . Cardiac event monitor  . EKG 12-Lead     Disposition:   FU with Metztli Sachdev pending monitor  Signed, Vincentina Sollers Meredith Leeds, MD  08/12/2015 3:50 PM     [facility] [address] [place] [address] [phone] (office) [phone] (fax)

## 2015-08-18 ENCOUNTER — Ambulatory Visit (INDEPENDENT_AMBULATORY_CARE_PROVIDER_SITE_OTHER): Payer: Medicaid Other

## 2015-08-18 DIAGNOSIS — R002 Palpitations: Secondary | ICD-10-CM | POA: Diagnosis not present

## 2015-09-01 ENCOUNTER — Other Ambulatory Visit: Payer: Medicaid Other

## 2015-09-01 ENCOUNTER — Ambulatory Visit (INDEPENDENT_AMBULATORY_CARE_PROVIDER_SITE_OTHER): Payer: Medicaid Other | Admitting: Certified Nurse Midwife

## 2015-09-01 VITALS — Temp 98.7°F | Wt 183.0 lb

## 2015-09-01 DIAGNOSIS — Z3492 Encounter for supervision of normal pregnancy, unspecified, second trimester: Secondary | ICD-10-CM

## 2015-09-01 LAB — POCT URINALYSIS DIPSTICK
Bilirubin, UA: NEGATIVE
Blood, UA: NEGATIVE
GLUCOSE UA: NEGATIVE
KETONES UA: NEGATIVE
LEUKOCYTES UA: NEGATIVE
Nitrite, UA: NEGATIVE
Protein, UA: NEGATIVE
SPEC GRAV UA: 1.015
UROBILINOGEN UA: NEGATIVE
pH, UA: 6

## 2015-09-01 NOTE — Progress Notes (Signed)
Subjective:    Peggy Savage is a 25 y.o. female being seen today for her obstetrical visit. She is at [redacted]w[redacted]d gestation. Patient reports: no complaints . Fetal movement: normal.  Problem List Items Addressed This Visit    None    Visit Diagnoses    Prenatal care, second trimester    -  Primary    Relevant Orders    POCT Urinalysis Dipstick (Completed)      Patient Active Problem List   Diagnosis Date Noted  . Supervision of normal first pregnancy in first trimester 05/12/2015   Objective:    Temp(Src) 37.1 C (98.7 F)  Wt 83008 g  LMP 02/21/2015 (Exact Date) FHT: 135 BPM  Uterine Size: 29 cm and size greater than dates     Assessment:    Pregnancy @ [redacted]w[redacted]d    S>D, monitor   Doing well  Plan:    OBGCT: ordered. Signs and symptoms of preterm labor: discussed.  Labs, problem list reviewed and updated 2 hr GTT today Follow up in 2 weeks.

## 2015-09-01 NOTE — Addendum Note (Signed)
Addended by: Lewie Loron D on: 09/01/2015 10:17 AM   Modules accepted: Orders

## 2015-09-02 LAB — RPR: RPR Ser Ql: NONREACTIVE

## 2015-09-02 LAB — GLUCOSE TOLERANCE, 2 HOURS W/ 1HR
GLUCOSE, 1 HOUR: 151 mg/dL (ref 65–179)
GLUCOSE, 2 HOUR: 93 mg/dL (ref 65–152)
GLUCOSE, FASTING: 75 mg/dL (ref 65–91)

## 2015-09-02 LAB — HIV ANTIBODY (ROUTINE TESTING W REFLEX): HIV Screen 4th Generation wRfx: NONREACTIVE

## 2015-09-02 LAB — CBC
Hematocrit: 34.7 % (ref 34.0–46.6)
Hemoglobin: 11.1 g/dL (ref 11.1–15.9)
MCH: 28.5 pg (ref 26.6–33.0)
MCHC: 32 g/dL (ref 31.5–35.7)
MCV: 89 fL (ref 79–97)
PLATELETS: 229 10*3/uL (ref 150–379)
RBC: 3.89 x10E6/uL (ref 3.77–5.28)
RDW: 13.9 % (ref 12.3–15.4)
WBC: 7.4 10*3/uL (ref 3.4–10.8)

## 2015-09-15 ENCOUNTER — Ambulatory Visit (INDEPENDENT_AMBULATORY_CARE_PROVIDER_SITE_OTHER): Payer: Medicaid Other | Admitting: Certified Nurse Midwife

## 2015-09-15 VITALS — BP 104/63 | HR 79 | Temp 98.4°F | Wt 185.0 lb

## 2015-09-15 DIAGNOSIS — Z3403 Encounter for supervision of normal first pregnancy, third trimester: Secondary | ICD-10-CM

## 2015-09-15 LAB — POCT URINALYSIS DIPSTICK
Bilirubin, UA: NEGATIVE
Blood, UA: NEGATIVE
Glucose, UA: NEGATIVE
KETONES UA: NEGATIVE
LEUKOCYTES UA: NEGATIVE
Nitrite, UA: NEGATIVE
PROTEIN UA: NEGATIVE
Spec Grav, UA: 1.015
Urobilinogen, UA: NEGATIVE
pH, UA: 6

## 2015-09-15 NOTE — Progress Notes (Signed)
Subjective:    Peggy Savage is a 25 y.o. female being seen today for her obstetrical visit. She is at [redacted]w[redacted]d gestation. Patient reports no complaints. Fetal movement: normal.  Problem List Items Addressed This Visit    None    Visit Diagnoses    Encounter for supervision of normal first pregnancy in third trimester    -  Primary    Relevant Orders    POCT urinalysis dipstick (Completed)      Patient Active Problem List   Diagnosis Date Noted  . Supervision of normal first pregnancy in first trimester 05/12/2015   Objective:    BP 104/63 mmHg  Pulse 79  Temp(Src) 98.4 F (36.9 C)  Wt 185 lb (83.915 kg)  LMP 02/21/2015 (Exact Date) FHT:  140 BPM  Uterine Size: size equals dates  Presentation: cephalic     Assessment:    Pregnancy @ [redacted]w[redacted]d weeks   Doing well Plan:     labs reviewed, problem list updated Consent signed. GBS planning TDAP offered  Rhogam given for RH negative Pediatrician: discussed. Infant feeding: plans to breastfeed. Maternity leave: discussed. Cigarette smoking: never smoked. Orders Placed This Encounter  Procedures  . POCT urinalysis dipstick   No orders of the defined types were placed in this encounter.   Follow up in 2 Weeks.

## 2015-09-15 NOTE — Progress Notes (Signed)
Patient reports she is doing well today. 

## 2015-09-24 ENCOUNTER — Encounter: Payer: Self-pay | Admitting: *Deleted

## 2015-09-24 NOTE — Progress Notes (Signed)
Patient ID: Peggy Savage, female   DOB: 1991/03/16, 25 y.o.   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  Placed in self addressed [employer] envelope and placed in outgoing mail.

## 2015-09-29 ENCOUNTER — Ambulatory Visit (INDEPENDENT_AMBULATORY_CARE_PROVIDER_SITE_OTHER): Payer: Medicaid Other | Admitting: Certified Nurse Midwife

## 2015-09-29 VITALS — BP 114/67 | HR 87 | Wt 188.0 lb

## 2015-09-29 DIAGNOSIS — Z3403 Encounter for supervision of normal first pregnancy, third trimester: Secondary | ICD-10-CM

## 2015-09-29 LAB — POCT URINALYSIS DIPSTICK
BILIRUBIN UA: NEGATIVE
Glucose, UA: NEGATIVE
Ketones, UA: NEGATIVE
Leukocytes, UA: NEGATIVE
Nitrite, UA: NEGATIVE
PH UA: 5
SPEC GRAV UA: 1.02
Urobilinogen, UA: NEGATIVE

## 2015-09-29 NOTE — Progress Notes (Signed)
Subjective:    Peggy Savage is a 25 y.o. female being seen today for her obstetrical visit. She is at [redacted]w[redacted]d gestation. Patient reports no complaints. Fetal movement: normal.  Problem List Items Addressed This Visit    None    Visit Diagnoses    Encounter for supervision of normal first pregnancy in third trimester    -  Primary    Relevant Orders    POCT urinalysis dipstick (Completed)      Patient Active Problem List   Diagnosis Date Noted  . Supervision of normal first pregnancy in first trimester 05/12/2015   Objective:    BP 114/67 mmHg  Pulse 87  Wt 188 lb (85.276 kg)  LMP 02/21/2015 (Exact Date) FHT:  140 BPM  Uterine Size: size equals dates  Presentation: cephalic     Assessment:    Pregnancy @ [redacted]w[redacted]d weeks   Plan:     labs reviewed, problem list updated Consent signed. GBS sent TDAP offered  Rhogam given for RH negative Pediatrician: discussed. Cigarette smoking: never smoked. Orders Placed This Encounter  Procedures  . POCT urinalysis dipstick   No orders of the defined types were placed in this encounter.   Follow up in 2 Weeks.

## 2015-09-29 NOTE — Progress Notes (Signed)
Subjective:    Peggy Savage is a 25 y.o. female being seen today for her obstetrical visit. She is at [redacted]w[redacted]d gestation. Patient reports no complaints. Fetal movement: normal.  Interpreter present for exam.   Problem List Items Addressed This Visit    None     Patient Active Problem List   Diagnosis Date Noted  . Supervision of normal first pregnancy in first trimester 05/12/2015   Objective:    LMP 02/21/2015 (Exact Date) FHT:  145 BPM  Uterine Size: size equals dates  Presentation: cephalic     Assessment:    Pregnancy @ [redacted]w[redacted]d weeks   Plan:     labs reviewed, problem list updated Consent signed. GBS planning TDAP offered  Rhogam given for RH negative Pediatrician: discussed. Infant feeding: plans to breastfeed. Maternity leave: discussed. Cigarette smoking: never smoked. No orders of the defined types were placed in this encounter.   No orders of the defined types were placed in this encounter.   Follow up in 2 Weeks.

## 2015-10-13 ENCOUNTER — Ambulatory Visit (INDEPENDENT_AMBULATORY_CARE_PROVIDER_SITE_OTHER): Payer: Medicaid Other | Admitting: Certified Nurse Midwife

## 2015-10-13 VITALS — Wt 188.0 lb

## 2015-10-13 DIAGNOSIS — Z3403 Encounter for supervision of normal first pregnancy, third trimester: Secondary | ICD-10-CM

## 2015-10-13 LAB — POCT URINALYSIS DIPSTICK
Bilirubin, UA: NEGATIVE
Glucose, UA: NEGATIVE
KETONES UA: NEGATIVE
Nitrite, UA: NEGATIVE
PH UA: 5
RBC UA: NEGATIVE
SPEC GRAV UA: 1.02
UROBILINOGEN UA: NEGATIVE

## 2015-10-13 NOTE — Progress Notes (Signed)
I agree with note by NP Student Jillyn Ledger.  Was present for exam.  R.Elwanda Moger CNM

## 2015-10-13 NOTE — Progress Notes (Signed)
Subjective:    Peggy Savage is a 25 y.o. female being seen today for her obstetrical visit. She is at [redacted]w[redacted]d gestation. Patient reports no complaints. Fetal movement: normal.  Patient declined interpreter.    Problem List Items Addressed This Visit    None    Visit Diagnoses    Encounter for supervision of normal first pregnancy in third trimester    -  Primary    Relevant Orders    POCT urinalysis dipstick      Patient Active Problem List   Diagnosis Date Noted  . Supervision of normal first pregnancy in first trimester 05/12/2015   Objective:    Wt 188 lb (85.276 kg)  LMP 02/21/2015 (Exact Date) FHT:  136 BPM  Uterine Size: size equals dates  Presentation: cephalic     Assessment:    Pregnancy @ [redacted]w[redacted]d weeks    Normal prenatal exam.  Plan:     labs reviewed, problem list updated Consent signed. GBS sent TDAP offered  Rhogam given for RH negative Pediatrician: discussed. Maternity leave: discussed.  Orders Placed This Encounter  Procedures  . POCT urinalysis dipstick   No orders of the defined types were placed in this encounter.   Follow up in 2 Weeks.

## 2015-10-27 ENCOUNTER — Ambulatory Visit (INDEPENDENT_AMBULATORY_CARE_PROVIDER_SITE_OTHER): Payer: Medicaid Other | Admitting: Certified Nurse Midwife

## 2015-10-27 VITALS — BP 112/77 | HR 97 | Temp 97.3°F | Wt 193.0 lb

## 2015-10-27 DIAGNOSIS — Z3403 Encounter for supervision of normal first pregnancy, third trimester: Secondary | ICD-10-CM

## 2015-10-27 DIAGNOSIS — Z3401 Encounter for supervision of normal first pregnancy, first trimester: Secondary | ICD-10-CM

## 2015-10-27 LAB — POCT URINALYSIS DIPSTICK
BILIRUBIN UA: NEGATIVE
GLUCOSE UA: NEGATIVE
Ketones, UA: NEGATIVE
Leukocytes, UA: NEGATIVE
NITRITE UA: NEGATIVE
RBC UA: NEGATIVE
SPEC GRAV UA: 1.015
Urobilinogen, UA: NEGATIVE
pH, UA: 5

## 2015-10-27 LAB — OB RESULTS CONSOLE GC/CHLAMYDIA
CHLAMYDIA, DNA PROBE: NEGATIVE
GC PROBE AMP, GENITAL: NEGATIVE

## 2015-10-27 NOTE — Progress Notes (Signed)
Subjective:    Peggy Savage is a 25 y.o. female being seen today for her obstetrical visit. She is at [redacted]w[redacted]d gestation. Patient reports no complaints. Fetal movement: normal.  Problem List Items Addressed This Visit    None    Visit Diagnoses    Encounter for supervision of normal first pregnancy in third trimester    -  Primary    Relevant Orders    POCT urinalysis dipstick (Completed)    Strep Gp B NAA    NuSwab VG+, Candida 6sp      Patient Active Problem List   Diagnosis Date Noted  . Supervision of normal first pregnancy in first trimester 05/12/2015   Objective:    BP 112/77 mmHg  Pulse 97  Temp(Src) 97.3 F (36.3 C)  Wt 193 lb (87.544 kg)  LMP 02/21/2015 (Exact Date) FHT:  140 BPM  Uterine Size: size equals dates  Presentation: cephalic   Cervix: 0.5 cm dilated, anterior, medium, long, -3 station  Assessment:    Pregnancy @ [redacted]w[redacted]d weeks   Doing well  Plan:     labs reviewed, problem list updated Consent signed. GBS sent TDAP offered  Rhogam given for RH negative Pediatrician: discussed. Infant feeding: plans to breastfeed. Maternity leave: N/a. Cigarette smoking: never smoked. Orders Placed This Encounter  Procedures  . Strep Gp B NAA  . POCT urinalysis dipstick   No orders of the defined types were placed in this encounter.   Follow up in 1 Week.

## 2015-10-27 NOTE — Assessment & Plan Note (Signed)
  Clinic  Prenatal Labs  Dating  Blood type: A/POS/-- (01/24 1108)   Genetic Screen 1 Screen:    AFP:     Quad:     NIPS: Antibody:NEG (01/24 1108)  Anatomic [place]  Rubella: 5.34 (01/24 1108)  GTT Early:               Third trimester:  RPR: Non Reactive (05/16 1130)   Flu vaccine  HBsAg: NEGATIVE (01/24 1108)   TDaP vaccine                                               Rhogam: HIV: Non Reactive (05/16 1130)   Baby Food       Breast                                        GBS: (For PCN allergy, check sensitivities)  Contraception  POP Pap:  Circumcision    Pediatrician    Support Person

## 2015-10-29 LAB — STREP GP B NAA: Strep Gp B NAA: POSITIVE — AB

## 2015-10-30 ENCOUNTER — Other Ambulatory Visit: Payer: Self-pay | Admitting: Certified Nurse Midwife

## 2015-10-31 LAB — NUSWAB VG+, CANDIDA 6SP
CANDIDA LUSITANIAE, NAA: NEGATIVE
CANDIDA TROPICALIS, NAA: NEGATIVE
CHLAMYDIA TRACHOMATIS, NAA: NEGATIVE
Candida albicans, NAA: NEGATIVE
Candida glabrata, NAA: NEGATIVE
Candida krusei, NAA: NEGATIVE
Candida parapsilosis, NAA: NEGATIVE
NEISSERIA GONORRHOEAE, NAA: NEGATIVE
Trich vag by NAA: NEGATIVE

## 2015-11-03 ENCOUNTER — Ambulatory Visit (INDEPENDENT_AMBULATORY_CARE_PROVIDER_SITE_OTHER): Payer: Medicaid Other | Admitting: Certified Nurse Midwife

## 2015-11-03 VITALS — BP 116/78 | HR 86 | Temp 97.6°F | Wt 195.0 lb

## 2015-11-03 DIAGNOSIS — Z3401 Encounter for supervision of normal first pregnancy, first trimester: Secondary | ICD-10-CM

## 2015-11-03 DIAGNOSIS — Z3403 Encounter for supervision of normal first pregnancy, third trimester: Secondary | ICD-10-CM

## 2015-11-03 LAB — POCT URINALYSIS DIPSTICK
Bilirubin, UA: NEGATIVE
Blood, UA: NEGATIVE
Glucose, UA: NEGATIVE
Ketones, UA: NEGATIVE
LEUKOCYTES UA: NEGATIVE
Nitrite, UA: NEGATIVE
PROTEIN UA: NEGATIVE
Spec Grav, UA: 1.015
UROBILINOGEN UA: NEGATIVE
pH, UA: 5

## 2015-11-03 NOTE — Progress Notes (Signed)
Patient reports she is doing well- she does have some heartburn at times.

## 2015-11-03 NOTE — Progress Notes (Signed)
Subjective:    Peggy Savage is a 25 y.o. female being seen today for her obstetrical visit. She is at [redacted]w[redacted]d gestation. Patient reports nausea and occasional diarrhea. Fetal movement: normal.  Interpreter present for exam.  Problem List Items Addressed This Visit      Other   Supervision of normal first pregnancy in first trimester     Clinic  [facility] Prenatal Labs  Dating  LMP Blood type: A/POS/-- (01/24 1108)   Genetic Screen 1 Screen:    AFP:  2/17: negative   Quad:     NIPS: Antibody:NEG (01/24 1108)  Anatomic [place]  Normal Rubella: 5.34 (01/24 1108)  GTT Early:               Third trimester: Normal RPR: Non Reactive (05/16 1130)   Flu vaccine  HBsAg: NEGATIVE (01/24 1108)   TDaP vaccine                                               Rhogam: N/A HIV: Non Reactive (05/16 1130)   Baby Food       Breast                                        [id number] (07/11 1303)(For PCN allergy, check sensitivities)  Contraception   POP Pap: 03/26/15: normal  Circumcision  N/A   Pediatrician  [facility] [place]   Support Person  Spouse           Other Visit Diagnoses    Encounter for supervision of normal first pregnancy in third trimester    -  Primary    Relevant Orders    POCT urinalysis dipstick (Completed)      Patient Active Problem List   Diagnosis Date Noted  . Supervision of normal first pregnancy in first trimester 05/12/2015   Objective:    BP 116/78 mmHg  Pulse 86  Temp(Src) 97.6 F (36.4 C)  Wt 195 lb (88.451 kg)  LMP 02/21/2015 (Exact Date) FHT:  150 BPM  Uterine Size: size equals dates  Presentation: cephalic     Assessment:    Pregnancy @ [redacted]w[redacted]d weeks  Occasional nausea. Occasional soft stools. Plan:     labs reviewed, problem list updated Consent signed. Tums for nausea. GBS sent TDAP offered  Rhogam given for RH negative Pediatrician: discussed. Infant feeding: plans to breastfeed. Maternity leave: discussed. Cigarette smoking: never  smoked. Orders Placed This Encounter  Procedures  . POCT urinalysis dipstick   No orders of the defined types were placed in this encounter.   Picked [facility]. Follow up in 1 Week.

## 2015-11-03 NOTE — Assessment & Plan Note (Signed)
  Clinic  [facility] Prenatal Labs  Dating  LMP Blood type: A/POS/-- (01/24 1108)   Genetic Screen 1 Screen:    AFP:  2/17: negative   Quad:     NIPS: Antibody:NEG (01/24 1108)  Anatomic [place]  Normal Rubella: 5.34 (01/24 1108)  GTT Early:               Third trimester: Normal RPR: Non Reactive (05/16 1130)   Flu vaccine  HBsAg: NEGATIVE (01/24 1108)   TDaP vaccine                                               Rhogam: N/A HIV: Non Reactive (05/16 1130)   Baby Food       Breast                                        [id number] (07/11 1303)(For PCN allergy, check sensitivities)  Contraception   POP Pap: 03/26/15: normal  Circumcision  N/A   Pediatrician  [facility] [place]   Support Person  Spouse

## 2015-11-03 NOTE — Progress Notes (Signed)
I agree with note by NP Student Jillyn Ledger.  Was present for exam.  R.Sha Burling CNM

## 2015-11-04 ENCOUNTER — Encounter (HOSPITAL_COMMUNITY): Payer: Self-pay | Admitting: *Deleted

## 2015-11-05 ENCOUNTER — Telehealth: Payer: Self-pay | Admitting: *Deleted

## 2015-11-05 NOTE — Telephone Encounter (Signed)
See phone note for this encounter. 

## 2015-11-10 ENCOUNTER — Encounter: Payer: Self-pay | Admitting: Obstetrics and Gynecology

## 2015-11-10 ENCOUNTER — Ambulatory Visit (INDEPENDENT_AMBULATORY_CARE_PROVIDER_SITE_OTHER): Payer: Medicaid Other | Admitting: Obstetrics and Gynecology

## 2015-11-10 DIAGNOSIS — Z789 Other specified health status: Secondary | ICD-10-CM | POA: Insufficient documentation

## 2015-11-10 DIAGNOSIS — O99820 Streptococcus B carrier state complicating pregnancy: Secondary | ICD-10-CM

## 2015-11-10 DIAGNOSIS — Z3483 Encounter for supervision of other normal pregnancy, third trimester: Secondary | ICD-10-CM

## 2015-11-10 DIAGNOSIS — Z3493 Encounter for supervision of normal pregnancy, unspecified, third trimester: Secondary | ICD-10-CM

## 2015-11-10 NOTE — Progress Notes (Signed)
Prenatal Visit Note Date: 11/10/2015 Clinic: [facility]  Subjective:  Peggy Savage is a 25 y.o. G1P0000 at [redacted]w[redacted]d being seen today for ongoing prenatal care.  She is currently monitored for the following issues for this low-risk pregnancy and has Supervision of normal first pregnancy in first trimester; Language barrier; and GBS (group B Streptococcus carrier), +RV culture, currently pregnant on her problem list.  Patient reports no complaints.   Contractions: Not present. Vag. Bleeding: None.  Movement: Present. Denies leaking of fluid.   The following portions of the patient's history were reviewed and updated as appropriate: allergies, current medications, past family history, past medical history, past social history, past surgical history and problem list. Problem list updated.  Objective:   Vitals:   11/10/15 1419  BP: 125/78  Pulse: (!) 102  Weight: 200 lb (90.7 kg)    Fetal Status: Fetal Heart Rate (bpm): 140s Fundal Height: 37 cm Movement: Present  Presentation: Vertex  General:  Alert, oriented and cooperative. Patient is in no acute distress.  Skin: Skin is warm and dry. No rash noted.   Cardiovascular: Normal heart rate noted  Respiratory: Normal respiratory effort, no problems with respiration noted  Abdomen: Soft, gravid, appropriate for gestational age. Pain/Pressure: Absent     Pelvic:  Cervical exam deferred        Extremities: Normal range of motion.  Edema: None  Mental Status: Normal mood and affect. Normal behavior. Normal judgment and thought content.   Urinalysis: Urine Protein: Negative Urine Glucose: Negative  Assessment and Plan:  Pregnancy: G1P0000 at [redacted]w[redacted]d  1. Language barrier Declined interpreter. Form signed today  2. GBS (group B Streptococcus carrier), +RV culture, currently pregnant Routine care. D/w pt re: need to go to hospital for labor/SROM. D/w them re: PDIOL as long as continues to be low risk pregnancy  Term labor symptoms and  general obstetric precautions including but not limited to vaginal bleeding, contractions, leaking of fluid and fetal movement were reviewed in detail with the patient. Please refer to After Visit Summary for other counseling recommendations.  Return in about 1 week (around 11/17/2015).   Aletha Halim, MD

## 2015-11-17 ENCOUNTER — Ambulatory Visit (INDEPENDENT_AMBULATORY_CARE_PROVIDER_SITE_OTHER): Payer: Medicaid Other | Admitting: Certified Nurse Midwife

## 2015-11-17 VITALS — BP 116/74 | HR 94 | Temp 98.9°F | Wt 197.0 lb

## 2015-11-17 DIAGNOSIS — Z3483 Encounter for supervision of other normal pregnancy, third trimester: Secondary | ICD-10-CM

## 2015-11-17 DIAGNOSIS — Z3493 Encounter for supervision of normal pregnancy, unspecified, third trimester: Secondary | ICD-10-CM

## 2015-11-17 LAB — POCT URINALYSIS DIPSTICK
Bilirubin, UA: NEGATIVE
GLUCOSE UA: NEGATIVE
Ketones, UA: NEGATIVE
Leukocytes, UA: NEGATIVE
Nitrite, UA: NEGATIVE
RBC UA: NEGATIVE
SPEC GRAV UA: 1.015
UROBILINOGEN UA: 0.2
pH, UA: 5

## 2015-11-17 NOTE — Progress Notes (Signed)
Subjective:    Peggy Savage is a 25 y.o. female being seen today for her obstetrical visit. She is at [redacted]w[redacted]d gestation. Patient reports no complaints. Fetal movement: normal.  Problem List Items Addressed This Visit    None    Visit Diagnoses   None.    Patient Active Problem List   Diagnosis Date Noted  . Language barrier 11/10/2015  . GBS (group B Streptococcus carrier), +RV culture, currently pregnant 11/10/2015  . Supervision of normal pregnancy in third trimester 05/12/2015    Objective:    BP 116/74   Pulse 94   Temp 98.9 F (37.2 C)   Wt 197 lb (89.4 kg)   LMP 02/21/2015 (Exact Date)   BMI 28.27 kg/m  FHT: 138 BPM  Uterine Size: 40 cm and size equals dates  Presentations: cephalic  Pelvic Exam: deferred     Assessment:    Pregnancy @ [redacted]w[redacted]d weeks   Language barrier  Doing well  Plan:   Plans for delivery: Vaginal anticipated; labs reviewed; problem list updated Counseling: Consent signed. Infant feeding: plans to breastfeed. Cigarette smoking: never smoked. L&D discussion: symptoms of labor, discussed when to call, discussed what number to call, anesthetic/analgesic options reviewed and delivering clinician:  plans no preference. Postpartum supports and preparation: circumcision discussed and contraception plans discussed.  Follow up in 1 Week.

## 2015-11-17 NOTE — Progress Notes (Signed)
Patient reports she is doing well 

## 2015-11-17 NOTE — Addendum Note (Signed)
Addended by: Dorothyann Gibbs on: 11/17/2015 02:15 PM   Modules accepted: Orders

## 2015-11-24 ENCOUNTER — Ambulatory Visit (INDEPENDENT_AMBULATORY_CARE_PROVIDER_SITE_OTHER): Payer: Medicaid Other | Admitting: Certified Nurse Midwife

## 2015-11-24 VITALS — BP 115/76 | HR 88 | Wt 202.0 lb

## 2015-11-24 DIAGNOSIS — Z3493 Encounter for supervision of normal pregnancy, unspecified, third trimester: Secondary | ICD-10-CM

## 2015-11-24 DIAGNOSIS — Z3483 Encounter for supervision of other normal pregnancy, third trimester: Secondary | ICD-10-CM

## 2015-11-24 LAB — POCT URINALYSIS DIPSTICK
Bilirubin, UA: NEGATIVE
Blood, UA: NEGATIVE
GLUCOSE UA: NEGATIVE
KETONES UA: NEGATIVE
LEUKOCYTES UA: NEGATIVE
Nitrite, UA: NEGATIVE
PROTEIN UA: NEGATIVE
SPEC GRAV UA: 1.015
Urobilinogen, UA: NEGATIVE
pH, UA: 5

## 2015-11-24 NOTE — Addendum Note (Signed)
Addended by: Lewie Loron D on: 11/24/2015 01:44 PM   Modules accepted: Orders

## 2015-11-24 NOTE — Progress Notes (Signed)
Subjective:    Peggy Savage is a 25 y.o. female being seen today for her obstetrical visit. She is at [redacted]w[redacted]d gestation. Patient reports no complaints. Fetal movement: normal.  Interpreter present for exam.   Problem List Items Addressed This Visit    None    Visit Diagnoses   None.    Patient Active Problem List   Diagnosis Date Noted  . Language barrier 11/10/2015  . GBS (group B Streptococcus carrier), +RV culture, currently pregnant 11/10/2015  . Supervision of normal pregnancy in third trimester 05/12/2015    Objective:    BP 115/76   Pulse 88   Wt 202 lb (91.6 kg)   LMP 02/21/2015 (Exact Date)   BMI 28.98 kg/m  FHT: 153 BPM  Uterine Size: 40 cm and size equals dates  Presentations: cephalic  Pelvic Exam:              Dilation: 0.5 cm       Effacement: Long             Station:  -3    Consistency: medium            Position: posterior     Assessment:    Pregnancy @ [redacted]w[redacted]d weeks   Plan:   Plans for delivery: Vaginal anticipated; labs reviewed; problem list updated Counseling: Consent signed. Infant feeding: plans to breastfeed. Cigarette smoking: never smoked. L&D discussion: symptoms of labor, discussed when to call, discussed what number to call, anesthetic/analgesic options reviewed and delivering clinician:  plans no preference. Postpartum supports and preparation: circumcision discussed and contraception plans discussed.  Follow up in 1 Week with NST.

## 2015-12-01 ENCOUNTER — Encounter (HOSPITAL_COMMUNITY): Payer: Self-pay

## 2015-12-01 ENCOUNTER — Inpatient Hospital Stay (HOSPITAL_COMMUNITY): Payer: Medicaid Other | Admitting: Anesthesiology

## 2015-12-01 ENCOUNTER — Encounter (HOSPITAL_COMMUNITY): Payer: Self-pay | Admitting: Anesthesiology

## 2015-12-01 ENCOUNTER — Inpatient Hospital Stay (HOSPITAL_COMMUNITY)
Admission: AD | Admit: 2015-12-01 | Discharge: 2015-12-03 | DRG: 767 | Disposition: A | Discharge status: Home / Self Care | Payer: Medicaid Other | Source: Ambulatory Visit | Attending: Family Medicine | Admitting: Family Medicine

## 2015-12-01 ENCOUNTER — Ambulatory Visit (INDEPENDENT_AMBULATORY_CARE_PROVIDER_SITE_OTHER): Payer: Medicaid Other | Admitting: Certified Nurse Midwife

## 2015-12-01 ENCOUNTER — Inpatient Hospital Stay (HOSPITAL_COMMUNITY)
Admission: AD | Admit: 2015-12-01 | Discharge: 2015-12-01 | Disposition: A | Discharge status: Home / Self Care | Payer: Medicaid Other | Source: Ambulatory Visit | Attending: Obstetrics & Gynecology | Admitting: Obstetrics & Gynecology

## 2015-12-01 VITALS — BP 130/71 | HR 78

## 2015-12-01 DIAGNOSIS — O480 Post-term pregnancy: Secondary | ICD-10-CM | POA: Diagnosis not present

## 2015-12-01 DIAGNOSIS — Z3493 Encounter for supervision of normal pregnancy, unspecified, third trimester: Secondary | ICD-10-CM | POA: Diagnosis not present

## 2015-12-01 DIAGNOSIS — O26853 Spotting complicating pregnancy, third trimester: Secondary | ICD-10-CM | POA: Insufficient documentation

## 2015-12-01 DIAGNOSIS — Z833 Family history of diabetes mellitus: Secondary | ICD-10-CM

## 2015-12-01 DIAGNOSIS — O471 False labor at or after 37 completed weeks of gestation: Secondary | ICD-10-CM | POA: Insufficient documentation

## 2015-12-01 DIAGNOSIS — Z3A41 41 weeks gestation of pregnancy: Secondary | ICD-10-CM

## 2015-12-01 DIAGNOSIS — IMO0001 Reserved for inherently not codable concepts without codable children: Secondary | ICD-10-CM

## 2015-12-01 DIAGNOSIS — O99824 Streptococcus B carrier state complicating childbirth: Secondary | ICD-10-CM | POA: Diagnosis not present

## 2015-12-01 DIAGNOSIS — Z3A40 40 weeks gestation of pregnancy: Secondary | ICD-10-CM

## 2015-12-01 DIAGNOSIS — Z3403 Encounter for supervision of normal first pregnancy, third trimester: Secondary | ICD-10-CM | POA: Diagnosis present

## 2015-12-01 DIAGNOSIS — Z8249 Family history of ischemic heart disease and other diseases of the circulatory system: Secondary | ICD-10-CM | POA: Diagnosis not present

## 2015-12-01 LAB — RAPID URINE DRUG SCREEN, HOSP PERFORMED
Amphetamines: NOT DETECTED
BARBITURATES: NOT DETECTED
Benzodiazepines: NOT DETECTED
Cocaine: NOT DETECTED
Opiates: NOT DETECTED
Tetrahydrocannabinol: NOT DETECTED

## 2015-12-01 LAB — CBC
HEMATOCRIT: 35.5 % — AB (ref 36.0–46.0)
HEMOGLOBIN: 11.9 g/dL — AB (ref 12.0–15.0)
MCH: 26.3 pg (ref 26.0–34.0)
MCHC: 33.5 g/dL (ref 30.0–36.0)
MCV: 78.4 fL (ref 78.0–100.0)
Platelets: 219 10*3/uL (ref 150–400)
RBC: 4.53 MIL/uL (ref 3.87–5.11)
RDW: 16.7 % — ABNORMAL HIGH (ref 11.5–15.5)
WBC: 10.6 10*3/uL — AB (ref 4.0–10.5)

## 2015-12-01 LAB — POCT URINALYSIS DIPSTICK
Bilirubin, UA: NEGATIVE
Blood, UA: NEGATIVE
Glucose, UA: NEGATIVE
KETONES UA: NEGATIVE
LEUKOCYTES UA: NEGATIVE
Nitrite, UA: NEGATIVE
PH UA: 6
SPEC GRAV UA: 1.015
UROBILINOGEN UA: NEGATIVE

## 2015-12-01 LAB — COMPREHENSIVE METABOLIC PANEL
ALBUMIN: 3.4 g/dL — AB (ref 3.5–5.0)
ALK PHOS: 180 U/L — AB (ref 38–126)
ALT: 19 U/L (ref 14–54)
AST: 26 U/L (ref 15–41)
Anion gap: 10 (ref 5–15)
BILIRUBIN TOTAL: 0.4 mg/dL (ref 0.3–1.2)
BUN: 6 mg/dL (ref 6–20)
CALCIUM: 9.2 mg/dL (ref 8.9–10.3)
CO2: 18 mmol/L — AB (ref 22–32)
CREATININE: 0.53 mg/dL (ref 0.44–1.00)
Chloride: 107 mmol/L (ref 101–111)
GFR calc Af Amer: >60 mL/min (ref >60)
GFR calc non Af Amer: >60 mL/min (ref >60)
GLUCOSE: 87 mg/dL (ref 65–99)
Potassium: 4.1 mmol/L (ref 3.5–5.1)
Sodium: 135 mmol/L (ref 135–145)
TOTAL PROTEIN: 7 g/dL (ref 6.5–8.1)

## 2015-12-01 LAB — PROTEIN / CREATININE RATIO, URINE
CREATININE, URINE: 187 mg/dL
Protein Creatinine Ratio: 0.18 mg/mg{Cre} — ABNORMAL HIGH (ref 0.00–0.15)
TOTAL PROTEIN, URINE: 33 mg/dL

## 2015-12-01 LAB — URINALYSIS, ROUTINE W REFLEX MICROSCOPIC
Bilirubin Urine: NEGATIVE
GLUCOSE, UA: NEGATIVE mg/dL
HGB URINE DIPSTICK: NEGATIVE
KETONES UR: 15 mg/dL — AB
Leukocytes, UA: NEGATIVE
Nitrite: NEGATIVE
PH: 5.5 (ref 5.0–8.0)
PROTEIN: NEGATIVE mg/dL
Specific Gravity, Urine: >1.03 — ABNORMAL HIGH (ref 1.005–1.030)

## 2015-12-01 MED ORDER — EPHEDRINE 5 MG/ML INJ
10.0000 mg | INTRAVENOUS | Status: DC | PRN
Start: 2015-12-01 — End: 2015-12-01
  Filled 2015-12-01: qty 4

## 2015-12-01 MED ORDER — BUPIVACAINE HCL (PF) 0.25 % IJ SOLN
INTRAMUSCULAR | Status: DC | PRN
  Administered 2015-12-01: 5 mL via EPIDURAL

## 2015-12-01 MED ORDER — SIMETHICONE 80 MG PO CHEW: 80.0000 mg | CHEWABLE_TABLET | ORAL | Status: DC | PRN

## 2015-12-01 MED ORDER — PHENYLEPHRINE 40 MCG/ML (10ML) SYRINGE FOR IV PUSH (FOR BLOOD PRESSURE SUPPORT)
80.0000 ug | PREFILLED_SYRINGE | INTRAVENOUS | Status: DC | PRN
  Filled 2015-12-01: qty 5

## 2015-12-01 MED ORDER — FENTANYL 2.5 MCG/ML BUPIVACAINE 1/10 % EPIDURAL INFUSION (WH - ANES)
14.0000 mL/h | INTRAMUSCULAR | Status: DC | PRN
  Administered 2015-12-01 (×2): 14 mL/h via EPIDURAL
  Filled 2015-12-01 (×2): qty 125

## 2015-12-01 MED ORDER — TERBUTALINE SULFATE 1 MG/ML IJ SOLN
0.2500 mg | Freq: Once | INTRAMUSCULAR | Status: DC | PRN
  Filled 2015-12-01: qty 1

## 2015-12-01 MED ORDER — FENTANYL CITRATE (PF) 100 MCG/2ML IJ SOLN: 100.0000 ug | INTRAMUSCULAR | Status: DC | PRN

## 2015-12-01 MED ORDER — ACETAMINOPHEN 325 MG PO TABS
650.0000 mg | ORAL_TABLET | ORAL | Status: DC | PRN
  Administered 2015-12-02: 650 mg via ORAL
  Filled 2015-12-01: qty 2

## 2015-12-01 MED ORDER — OXYCODONE-ACETAMINOPHEN 5-325 MG PO TABS: 2.0000 | ORAL_TABLET | ORAL | Status: DC | PRN

## 2015-12-01 MED ORDER — OXYTOCIN BOLUS FROM INFUSION
500.0000 mL | Freq: Once | INTRAVENOUS | Status: AC
  Administered 2015-12-01: 500 mL via INTRAVENOUS

## 2015-12-01 MED ORDER — PENICILLIN G POTASSIUM 5000000 UNITS IJ SOLR
5.0000 10*6.[IU] | Freq: Once | INTRAVENOUS | Status: AC
  Administered 2015-12-01: 5 10*6.[IU] via INTRAVENOUS
  Filled 2015-12-01: qty 5

## 2015-12-01 MED ORDER — OXYTOCIN 40 UNITS IN LACTATED RINGERS INFUSION - SIMPLE MED
2.5000 [IU]/h | INTRAVENOUS | Status: DC
  Administered 2015-12-01: 2.5 [IU]/h via INTRAVENOUS
  Filled 2015-12-01: qty 1000

## 2015-12-01 MED ORDER — IBUPROFEN 600 MG PO TABS
600.0000 mg | ORAL_TABLET | Freq: Four times a day (QID) | ORAL | Status: DC
  Administered 2015-12-02 – 2015-12-03 (×7): 600 mg via ORAL
  Filled 2015-12-01 (×7): qty 1

## 2015-12-01 MED ORDER — TETANUS-DIPHTH-ACELL PERTUSSIS 5-2.5-18.5 LF-MCG/0.5 IM SUSP
0.5000 mL | Freq: Once | INTRAMUSCULAR | Status: AC
  Administered 2015-12-03: 0.5 mL via INTRAMUSCULAR
  Filled 2015-12-01 (×2): qty 0.5

## 2015-12-01 MED ORDER — COCONUT OIL OIL: 1.0000 "application " | TOPICAL_OIL | Status: DC | PRN

## 2015-12-01 MED ORDER — OXYTOCIN 40 UNITS IN LACTATED RINGERS INFUSION - SIMPLE MED
1.0000 m[IU]/min | INTRAVENOUS | Status: DC
  Administered 2015-12-01: 2 m[IU]/min via INTRAVENOUS

## 2015-12-01 MED ORDER — MEASLES, MUMPS & RUBELLA VAC ~~LOC~~ INJ
0.5000 mL | INJECTION | Freq: Once | SUBCUTANEOUS | Status: DC
  Filled 2015-12-01: qty 0.5

## 2015-12-01 MED ORDER — PENICILLIN G POTASSIUM 5000000 UNITS IJ SOLR
2.5000 10*6.[IU] | INTRAVENOUS | Status: DC
  Administered 2015-12-01 (×2): 2.5 10*6.[IU] via INTRAVENOUS
  Filled 2015-12-01 (×3): qty 2.5

## 2015-12-01 MED ORDER — BENZOCAINE-MENTHOL 20-0.5 % EX AERO
1.0000 "application " | INHALATION_SPRAY | CUTANEOUS | Status: DC | PRN
  Administered 2015-12-02: 1 via TOPICAL
  Filled 2015-12-01: qty 56

## 2015-12-01 MED ORDER — MAGNESIUM HYDROXIDE 400 MG/5ML PO SUSP: 30.0000 mL | ORAL | Status: DC | PRN

## 2015-12-01 MED ORDER — EPHEDRINE 5 MG/ML INJ
10.0000 mg | INTRAVENOUS | Status: DC | PRN
  Filled 2015-12-01: qty 4

## 2015-12-01 MED ORDER — LACTATED RINGERS IV SOLN: 500.0000 mL | Freq: Once | INTRAVENOUS | Status: DC

## 2015-12-01 MED ORDER — SODIUM BICARBONATE 8.4 % IV SOLN
INTRAVENOUS | Status: DC | PRN
  Administered 2015-12-01: 5 mL via EPIDURAL

## 2015-12-01 MED ORDER — SOD CITRATE-CITRIC ACID 500-334 MG/5ML PO SOLN: 30.0000 mL | ORAL | Status: DC | PRN

## 2015-12-01 MED ORDER — OXYCODONE-ACETAMINOPHEN 5-325 MG PO TABS: 1.0000 | ORAL_TABLET | ORAL | Status: DC | PRN

## 2015-12-01 MED ORDER — PHENYLEPHRINE 40 MCG/ML (10ML) SYRINGE FOR IV PUSH (FOR BLOOD PRESSURE SUPPORT)
80.0000 ug | PREFILLED_SYRINGE | INTRAVENOUS | Status: DC | PRN
  Filled 2015-12-01: qty 10
  Filled 2015-12-01: qty 5

## 2015-12-01 MED ORDER — ONDANSETRON HCL 4 MG/2ML IJ SOLN: 4.0000 mg | INTRAMUSCULAR | Status: DC | PRN

## 2015-12-01 MED ORDER — ONDANSETRON HCL 4 MG PO TABS: 4.0000 mg | ORAL_TABLET | ORAL | Status: DC | PRN

## 2015-12-01 MED ORDER — DIPHENHYDRAMINE HCL 50 MG/ML IJ SOLN: 12.5000 mg | INTRAMUSCULAR | Status: DC | PRN

## 2015-12-01 MED ORDER — DIPHENHYDRAMINE HCL 25 MG PO CAPS: 25.0000 mg | ORAL_CAPSULE | Freq: Four times a day (QID) | ORAL | Status: DC | PRN

## 2015-12-01 MED ORDER — OXYCODONE-ACETAMINOPHEN 5-325 MG PO TABS
1.0000 | ORAL_TABLET | ORAL | Status: DC | PRN
Start: 2015-12-01 — End: 2015-12-03

## 2015-12-01 MED ORDER — DIBUCAINE 1 % RE OINT: 1.0000 "application " | TOPICAL_OINTMENT | RECTAL | Status: DC | PRN

## 2015-12-01 MED ORDER — ZOLPIDEM TARTRATE 5 MG PO TABS: 5.0000 mg | ORAL_TABLET | Freq: Every evening | ORAL | Status: DC | PRN

## 2015-12-01 MED ORDER — LIDOCAINE HCL (PF) 1 % IJ SOLN
INTRAMUSCULAR | Status: DC | PRN
  Administered 2015-12-01 (×2): 7 mL via EPIDURAL

## 2015-12-01 MED ORDER — OXYTOCIN 10 UNIT/ML IJ SOLN: 10.0000 [IU] | Freq: Once | INTRAMUSCULAR | Status: DC

## 2015-12-01 MED ORDER — LACTATED RINGERS IV SOLN
INTRAVENOUS | Status: DC
  Administered 2015-12-01: 16:00:00 via INTRAVENOUS

## 2015-12-01 MED ORDER — ONDANSETRON HCL 4 MG/2ML IJ SOLN
4.0000 mg | Freq: Four times a day (QID) | INTRAMUSCULAR | Status: DC | PRN
  Administered 2015-12-01: 4 mg via INTRAVENOUS
  Filled 2015-12-01: qty 2

## 2015-12-01 MED ORDER — PRENATAL MULTIVITAMIN CH
1.0000 | ORAL_TABLET | Freq: Every day | ORAL | Status: DC
  Administered 2015-12-02 – 2015-12-03 (×2): 1 via ORAL
  Filled 2015-12-01 (×2): qty 1

## 2015-12-01 MED ORDER — LACTATED RINGERS IV SOLN
500.0000 mL | INTRAVENOUS | Status: DC | PRN
  Administered 2015-12-01: 1000 mL via INTRAVENOUS

## 2015-12-01 MED ORDER — LIDOCAINE HCL (PF) 1 % IJ SOLN
30.0000 mL | INTRAMUSCULAR | Status: DC | PRN
  Filled 2015-12-01: qty 30

## 2015-12-01 MED ORDER — FERROUS SULFATE 325 (65 FE) MG PO TABS
325.0000 mg | ORAL_TABLET | Freq: Two times a day (BID) | ORAL | Status: DC
  Administered 2015-12-02 – 2015-12-03 (×3): 325 mg via ORAL
  Filled 2015-12-01 (×3): qty 1

## 2015-12-01 MED ORDER — ACETAMINOPHEN 325 MG PO TABS: 650.0000 mg | ORAL_TABLET | ORAL | Status: DC | PRN

## 2015-12-01 MED ORDER — WITCH HAZEL-GLYCERIN EX PADS: 1.0000 "application " | MEDICATED_PAD | CUTANEOUS | Status: DC | PRN

## 2015-12-01 NOTE — Progress Notes (Addendum)
Subjective:    Peggy Savage is a 25 y.o. female being seen today for her obstetrical visit. She is at [redacted]w[redacted]d gestation. Patient reports backache, contractions since early this morning, was seen at [facility] for contractions and sent home, nausea and vomiting. Fetal movement: normal.  Interpreter present for exam.    Problem List Items Addressed This Visit    None    Visit Diagnoses    Prenatal care, third trimester    -  Primary   Relevant Orders   POCT Urinalysis Dipstick   Fetal nonstress test     Patient Active Problem List   Diagnosis Date Noted  . Language barrier 11/10/2015  . GBS (group B Streptococcus carrier), +RV culture, currently pregnant 11/10/2015  . Supervision of normal pregnancy in third trimester 05/12/2015    Objective:    BP 130/71   Pulse 78   LMP 02/21/2015 (Exact Date)  FHT:  145 BPM  Uterine Size: size equals dates  Presentation: cephalic  Pelvic Exam:              Dilation: 6 cm       Effacement: 75%   Station:  -2     Consistency: soft            Position: middle     Assessment:    Pregnancy @ [redacted]w[redacted]d  weeks   Active labor at term  Reactive NST  Contractions every 2-4 minutes  Plan:    Postdates management: discussed fetal surveillance and induction, discussed fetal movement, NST reactive, biophysical profile ordered. Follow up in 2 weeks postpartum.

## 2015-12-01 NOTE — Discharge Instructions (Signed)
Third Trimester of Pregnancy °The third trimester is from week 29 through week 42, months 7 through 9. The third trimester is a time when the fetus is growing rapidly. At the end of the ninth month, the fetus is about 20 inches in length and weighs 6-10 pounds.  °BODY CHANGES °Your body goes through many changes during pregnancy. The changes vary from woman to woman.  °· Your weight will continue to increase. You can expect to gain 25-35 pounds (11-16 kg) by the end of the pregnancy. °· You may begin to get stretch marks on your hips, abdomen, and breasts. °· You may urinate more often because the fetus is moving lower into your pelvis and pressing on your bladder. °· You may develop or continue to have heartburn as a result of your pregnancy. °· You may develop constipation because certain hormones are causing the muscles that push waste through your intestines to slow down. °· You may develop hemorrhoids or swollen, bulging veins (varicose veins). °· You may have pelvic pain because of the weight gain and pregnancy hormones relaxing your joints between the bones in your pelvis. Backaches may result from overexertion of the muscles supporting your posture. °· You may have changes in your hair. These can include thickening of your hair, rapid growth, and changes in texture. Some women also have hair loss during or after pregnancy, or hair that feels dry or thin. Your hair will most likely return to normal after your baby is born. °· Your breasts will continue to grow and be tender. A yellow discharge may leak from your breasts called colostrum. °· Your belly button may stick out. °· You may feel short of breath because of your expanding uterus. °· You may notice the fetus "dropping," or moving lower in your abdomen. °· You may have a bloody mucus discharge. This usually occurs a few days to a week before labor begins. °· Your cervix becomes thin and soft (effaced) near your due date. °WHAT TO EXPECT AT YOUR PRENATAL  EXAMS  °You will have prenatal exams every 2 weeks until week 36. Then, you will have weekly prenatal exams. During a routine prenatal visit: °· You will be weighed to make sure you and the fetus are growing normally. °· Your blood pressure is taken. °· Your abdomen will be measured to track your baby's growth. °· The fetal heartbeat will be listened to. °· Any test results from the previous visit will be discussed. °· You may have a cervical check near your due date to see if you have effaced. °At around 36 weeks, your caregiver will check your cervix. At the same time, your caregiver will also perform a test on the secretions of the vaginal tissue. This test is to determine if a type of bacteria, Group B streptococcus, is present. Your caregiver will explain this further. °Your caregiver may ask you: °· What your birth plan is. °· How you are feeling. °· If you are feeling the baby move. °· If you have had any abnormal symptoms, such as leaking fluid, bleeding, severe headaches, or abdominal cramping. °· If you are using any tobacco products, including cigarettes, chewing tobacco, and electronic cigarettes. °· If you have any questions. °Other tests or screenings that may be performed during your third trimester include: °· Blood tests that check for low iron levels (anemia). °· Fetal testing to check the health, activity level, and growth of the fetus. Testing is done if you have certain medical conditions or if   there are problems during the pregnancy. °· HIV (human immunodeficiency virus) testing. If you are at high risk, you may be screened for HIV during your third trimester of pregnancy. °FALSE LABOR °You may feel small, irregular contractions that eventually go away. These are called Braxton Hicks contractions, or false labor. Contractions may last for hours, days, or even weeks before true labor sets in. If contractions come at regular intervals, intensify, or become painful, it is best to be seen by your  caregiver.  °SIGNS OF LABOR  °· Menstrual-like cramps. °· Contractions that are 5 minutes apart or less. °· Contractions that start on the top of the uterus and spread down to the lower abdomen and back. °· A sense of increased pelvic pressure or back pain. °· A watery or bloody mucus discharge that comes from the vagina. °If you have any of these signs before the 37th week of pregnancy, call your caregiver right away. You need to go to the hospital to get checked immediately. °HOME CARE INSTRUCTIONS  °· Avoid all smoking, herbs, alcohol, and unprescribed drugs. These chemicals affect the formation and growth of the baby. °· Do not use any tobacco products, including cigarettes, chewing tobacco, and electronic cigarettes. If you need help quitting, ask your health care provider. You may receive counseling support and other resources to help you quit. °· Follow your caregiver's instructions regarding medicine use. There are medicines that are either safe or unsafe to take during pregnancy. °· Exercise only as directed by your caregiver. Experiencing uterine cramps is a good sign to stop exercising. °· Continue to eat regular, healthy meals. °· Wear a good support bra for breast tenderness. °· Do not use hot tubs, steam rooms, or saunas. °· Wear your seat belt at all times when driving. °· Avoid raw meat, uncooked cheese, cat litter boxes, and soil used by cats. These carry germs that can cause birth defects in the baby. °· Take your prenatal vitamins. °· Take 1500-2000 mg of calcium daily starting at the 20th week of pregnancy until you deliver your baby. °· Try taking a stool softener (if your caregiver approves) if you develop constipation. Eat more high-fiber foods, such as fresh vegetables or fruit and whole grains. Drink plenty of fluids to keep your urine clear or pale yellow. °· Take warm sitz baths to soothe any pain or discomfort caused by hemorrhoids. Use hemorrhoid cream if your caregiver approves. °· If  you develop varicose veins, wear support hose. Elevate your feet for 15 minutes, 3-4 times a day. Limit salt in your diet. °· Avoid heavy lifting, wear low heal shoes, and practice good posture. °· Rest a lot with your legs elevated if you have leg cramps or low back pain. °· Visit your dentist if you have not gone during your pregnancy. Use a soft toothbrush to brush your teeth and be gentle when you floss. °· A sexual relationship may be continued unless your caregiver directs you otherwise. °· Do not travel far distances unless it is absolutely necessary and only with the approval of your caregiver. °· Take prenatal classes to understand, practice, and ask questions about the labor and delivery. °· Make a trial run to the hospital. °· Pack your hospital bag. °· Prepare the baby's nursery. °· Continue to go to all your prenatal visits as directed by your caregiver. °SEEK MEDICAL CARE IF: °· You are unsure if you are in labor or if your water has broken. °· You have dizziness. °· You have   mild pelvic cramps, pelvic pressure, or nagging pain in your abdominal area. °· You have persistent nausea, vomiting, or diarrhea. °· You have a bad smelling vaginal discharge. °· You have pain with urination. °SEEK IMMEDIATE MEDICAL CARE IF:  °· You have a fever. °· You are leaking fluid from your vagina. °· You have spotting or bleeding from your vagina. °· You have severe abdominal cramping or pain. °· You have rapid weight loss or gain. °· You have shortness of breath with chest pain. °· You notice sudden or extreme swelling of your face, hands, ankles, feet, or legs. °· You have not felt your baby move in over an hour. °· You have severe headaches that do not go away with medicine. °· You have vision changes. °  °This information is not intended to replace advice given to you by your health care provider. Make sure you discuss any questions you have with your health care provider. °  °Document Released: 03/29/2001 Document  Revised: 04/25/2014 Document Reviewed: 06/05/2012 °Elsevier Interactive Patient Education ©2016 [employer] °Fetal Movement Counts °Patient Name: __________________________________________________ Patient Due Date: ____________________ °Performing a fetal movement count is highly recommended in high-risk pregnancies, but it is good for every pregnant woman to do. Your health care provider may ask you to start counting fetal movements at 28 weeks of the pregnancy. Fetal movements often increase: °· After eating a full meal. °· After physical activity. °· After eating or drinking something sweet or cold. °· At rest. °Pay attention to when you feel the baby is most active. This will help you notice a pattern of your baby's sleep and wake cycles and what factors contribute to an increase in fetal movement. It is important to perform a fetal movement count at the same time each day when your baby is normally most active.  °HOW TO COUNT FETAL MOVEMENTS °1. Find a quiet and comfortable area to sit or lie down on your left side. Lying on your left side provides the best blood and oxygen circulation to your baby. °2. Write down the day and time on a sheet of paper or in a journal. °3. Start counting kicks, flutters, swishes, rolls, or jabs in a 2-hour period. You should feel at least 10 movements within 2 hours. °4. If you do not feel 10 movements in 2 hours, wait 2-3 hours and count again. Look for a change in the pattern or not enough counts in 2 hours. °SEEK MEDICAL CARE IF: °· You feel less than 10 counts in 2 hours, tried twice. °· There is no movement in over an hour. °· The pattern is changing or taking longer each day to reach 10 counts in 2 hours. °· You feel the baby is not moving as he or she usually does. °Date: ____________ Movements: ____________ Start time: ____________ Finish time: ____________  °Date: ____________ Movements: ____________ Start time: ____________ Finish time: ____________ °Date:  ____________ Movements: ____________ Start time: ____________ Finish time: ____________ °Date: ____________ Movements: ____________ Start time: ____________ Finish time: ____________ °Date: ____________ Movements: ____________ Start time: ____________ Finish time: ____________ °Date: ____________ Movements: ____________ Start time: ____________ Finish time: ____________ °Date: ____________ Movements: ____________ Start time: ____________ Finish time: ____________ °Date: ____________ Movements: ____________ Start time: ____________ Finish time: ____________  °Date: ____________ Movements: ____________ Start time: ____________ Finish time: ____________ °Date: ____________ Movements: ____________ Start time: ____________ Finish time: ____________ °Date: ____________ Movements: ____________ Start time: ____________ Finish time: ____________ °Date: ____________ Movements: ____________ Start time: ____________ Finish time: ____________ °Date:   ____________ Movements: ____________ Start time: ____________ Finish time: ____________ °Date: ____________ Movements: ____________ Start time: ____________ Finish time: ____________ °Date: ____________ Movements: ____________ Start time: ____________ Finish time: ____________  °Date: ____________ Movements: ____________ Start time: ____________ Finish time: ____________ °Date: ____________ Movements: ____________ Start time: ____________ Finish time: ____________ °Date: ____________ Movements: ____________ Start time: ____________ Finish time: ____________ °Date: ____________ Movements: ____________ Start time: ____________ Finish time: ____________ °Date: ____________ Movements: ____________ Start time: ____________ Finish time: ____________ °Date: ____________ Movements: ____________ Start time: ____________ Finish time: ____________ °Date: ____________ Movements: ____________ Start time: ____________ Finish time: ____________  °Date: ____________ Movements: ____________ Start  time: ____________ Finish time: ____________ °Date: ____________ Movements: ____________ Start time: ____________ Finish time: ____________ °Date: ____________ Movements: ____________ Start time: ____________ Finish time: ____________ °Date: ____________ Movements: ____________ Start time: ____________ Finish time: ____________ °Date: ____________ Movements: ____________ Start time: ____________ Finish time: ____________ °Date: ____________ Movements: ____________ Start time: ____________ Finish time: ____________ °Date: ____________ Movements: ____________ Start time: ____________ Finish time: ____________  °Date: ____________ Movements: ____________ Start time: ____________ Finish time: ____________ °Date: ____________ Movements: ____________ Start time: ____________ Finish time: ____________ °Date: ____________ Movements: ____________ Start time: ____________ Finish time: ____________ °Date: ____________ Movements: ____________ Start time: ____________ Finish time: ____________ °Date: ____________ Movements: ____________ Start time: ____________ Finish time: ____________ °Date: ____________ Movements: ____________ Start time: ____________ Finish time: ____________ °Date: ____________ Movements: ____________ Start time: ____________ Finish time: ____________  °Date: ____________ Movements: ____________ Start time: ____________ Finish time: ____________ °Date: ____________ Movements: ____________ Start time: ____________ Finish time: ____________ °Date: ____________ Movements: ____________ Start time: ____________ Finish time: ____________ °Date: ____________ Movements: ____________ Start time: ____________ Finish time: ____________ °Date: ____________ Movements: ____________ Start time: ____________ Finish time: ____________ °Date: ____________ Movements: ____________ Start time: ____________ Finish time: ____________ °Date: ____________ Movements: ____________ Start time: ____________ Finish time: ____________    °Date: ____________ Movements: ____________ Start time: ____________ Finish time: ____________ °Date: ____________ Movements: ____________ Start time: ____________ Finish time: ____________ °Date: ____________ Movements: ____________ Start time: ____________ Finish time: ____________ °Date: ____________ Movements: ____________ Start time: ____________ Finish time: ____________ °Date: ____________ Movements: ____________ Start time: ____________ Finish time: ____________ °Date: ____________ Movements: ____________ Start time: ____________ Finish time: ____________ °Date: ____________ Movements: ____________ Start time: ____________ Finish time: ____________  °Date: ____________ Movements: ____________ Start time: ____________ Finish time: ____________ °Date: ____________ Movements: ____________ Start time: ____________ Finish time: ____________ °Date: ____________ Movements: ____________ Start time: ____________ Finish time: ____________ °Date: ____________ Movements: ____________ Start time: ____________ Finish time: ____________ °Date: ____________ Movements: ____________ Start time: ____________ Finish time: ____________ °Date: ____________ Movements: ____________ Start time: ____________ Finish time: ____________ °  °This information is not intended to replace advice given to you by your health care provider. Make sure you discuss any questions you have with your health care provider. °  °Document Released: 05/04/2006 Document Revised: 04/25/2014 Document Reviewed: 01/30/2012 °Elsevier Interactive Patient Education ©2016 [employer] °Braxton Hicks Contractions °Contractions of the uterus can occur throughout pregnancy. Contractions are not always a sign that you are in labor.  °WHAT ARE BRAXTON HICKS CONTRACTIONS?  °Contractions that occur before labor are called Braxton Hicks contractions, or false labor. Toward the end of pregnancy (32-34 weeks), these contractions can develop more often and may become more  forceful. This is not true labor because these contractions do not result in opening (dilatation) and thinning of the cervix. They are sometimes difficult to tell apart from true labor because these contractions can be forceful and people have different pain tolerances. You   should not feel embarrassed if you go to the hospital with false labor. Sometimes, the only way to tell if you are in true labor is for your health care provider to look for changes in the cervix. °If there are no prenatal problems or other health problems associated with the pregnancy, it is completely safe to be sent home with false labor and await the onset of true labor. °HOW CAN YOU TELL THE DIFFERENCE BETWEEN TRUE AND FALSE LABOR? °False Labor °· The contractions of false labor are usually shorter and not as hard as those of true labor.   °· The contractions are usually irregular.   °· The contractions are often felt in the front of the lower abdomen and in the groin.   °· The contractions may go away when you walk around or change positions while lying down.   °· The contractions get weaker and are shorter lasting as time goes on.   °· The contractions do not usually become progressively stronger, regular, and closer together as with true labor.   °True Labor °· Contractions in true labor last 30-70 seconds, become very regular, usually become more intense, and increase in frequency.   °· The contractions do not go away with walking.   °· The discomfort is usually felt in the top of the uterus and spreads to the lower abdomen and low back.   °· True labor can be determined by your health care provider with an exam. This will show that the cervix is dilating and getting thinner.   °WHAT TO REMEMBER °· Keep up with your usual exercises and follow other instructions given by your health care provider.   °· Take medicines as directed by your health care provider.   °· Keep your regular prenatal appointments.   °· Eat and drink lightly if you  think you are going into labor.   °· If Braxton Hicks contractions are making you uncomfortable:   °¨ Change your position from lying down or resting to walking, or from walking to resting.   °¨ Sit and rest in a tub of warm water.   °¨ Drink 2-3 glasses of water. Dehydration may cause these contractions.   °¨ Do slow and deep breathing several times an hour.   °WHEN SHOULD I SEEK IMMEDIATE MEDICAL CARE? °Seek immediate medical care if: °· Your contractions become stronger, more regular, and closer together.   °· You have fluid leaking or gushing from your vagina.   °· You have a fever.   °· You pass blood-tinged mucus.   °· You have vaginal bleeding.   °· You have continuous abdominal pain.   °· You have low back pain that you never had before.   °· You feel your baby's head pushing down and causing pelvic pressure.   °· Your baby is not moving as much as it used to.   °  °This information is not intended to replace advice given to you by your health care provider. Make sure you discuss any questions you have with your health care provider. °  °Document Released: 04/04/2005 Document Revised: 04/09/2013 Document Reviewed: 01/14/2013 °Elsevier Interactive Patient Education ©2016 [employer] ° °

## 2015-12-01 NOTE — Progress Notes (Signed)
Labor Progress Note Peggy Savage is a 25 y.o. G1P0000 at [redacted]w[redacted]d presented for labor  S:  Mostly comfortable some left lower abd pain, has been lying on rt side  O:  Temp 98 F (36.7 C) (Oral)   Resp 16   LMP 02/21/2015 (Exact Date)  EFM: baseline 140 bpm/ mod variability/ mod accels/ no decels  Toco: 2-4 SVE: Dilation: 8 Effacement (%): 100 Station: 0 Presentation: Vertex Exam by:: [name] RNC   A/P: 25 y.o. G1P0000 [redacted]w[redacted]d  1. Labor: protracted 2. FWB: Cat I 3. Pain: epidural Start Pitocin augmentation. Left lateral position. Anticipate SVD.  [name], CNM 5:03 PM

## 2015-12-01 NOTE — Progress Notes (Signed)
Notified of pt arrival in MAU and exam. Also consulted MD about audible FHR arrhythmia. MD reviewed tracing. OK to discharge pt home and keep follow up appointment this am.

## 2015-12-01 NOTE — Addendum Note (Signed)
Addended by: Bettye Boeck on: 12/01/2015 10:26 AM   Modules accepted: Orders, SmartSet

## 2015-12-01 NOTE — Anesthesia Preprocedure Evaluation (Signed)
Anesthesia Evaluation  Patient identified by MRN, date of birth, ID band Patient awake    Reviewed: Allergy & Precautions, H&P , NPO status , Patient's Chart, lab work & pertinent test results  Airway Mallampati: I  TM Distance: >3 FB Neck ROM: full    Dental no notable dental hx.    Pulmonary neg pulmonary ROS,    Pulmonary exam normal        Cardiovascular negative cardio ROS Normal cardiovascular exam     Neuro/Psych negative neurological ROS  negative psych ROS   GI/Hepatic negative GI ROS, Neg liver ROS,   Endo/Other  negative endocrine ROS  Renal/GU negative Renal ROS     Musculoskeletal   Abdominal Normal abdominal exam  (+)   Peds  Hematology   Anesthesia Other Findings   Reproductive/Obstetrics (+) Pregnancy                             Anesthesia Physical Anesthesia Plan  ASA: II  Anesthesia Plan: Epidural   Post-op Pain Management:    Induction:   Airway Management Planned:   Additional Equipment:   Intra-op Plan:   Post-operative Plan:   Informed Consent: I have reviewed the patients History and Physical, chart, labs and discussed the procedure including the risks, benefits and alternatives for the proposed anesthesia with the patient or authorized representative who has indicated his/her understanding and acceptance.     Plan Discussed with:   Anesthesia Plan Comments:         Anesthesia Quick Evaluation

## 2015-12-01 NOTE — MAU Note (Signed)
Pt presents complaining of contractions that have gotten worse all night with some discharge that is brown. Denies bleeding. Reports good fetal movement.

## 2015-12-01 NOTE — Progress Notes (Signed)
Using Arabic interpreter.

## 2015-12-01 NOTE — Anesthesia Pain Management Evaluation Note (Signed)
  [facility] Pain Management Visit Note  Patient: Peggy Savage, 25 y.o., female  "Hello I am a member of the anesthesia team at [facility]. We have an anesthesia team available at all times to provide care throughout the hospital, including epidural management and anesthesia for C-section. I don't know your plan for the delivery whether it a natural birth, water birth, IV sedation, nitrous supplementation, doula or epidural, but we want to meet your pain goals."   1.Was your pain managed to your expectations on prior hospitalizations?     2.What is your expectation for pain management during this hospitalization?     3.How can we help you reach that goal?   Record the patient's initial score and the patient's pain goal.   Pain: 0  Pain Goal: 6 [facility] wants you to be able to say your pain was always managed very well.  Jabier Mutton 12/01/2015

## 2015-12-01 NOTE — Anesthesia Procedure Notes (Signed)
Epidural Patient location during procedure: OB Start time: 12/01/2015 11:40 AM End time: 12/01/2015 11:44 AM  Staffing Anesthesiologist: Lyn Hollingshead Performed: anesthesiologist   Preanesthetic Checklist Completed: patient identified, surgical consent, pre-op evaluation, timeout performed, IV checked, risks and benefits discussed and monitors and equipment checked  Epidural Patient position: sitting Prep: site prepped and draped and DuraPrep Patient monitoring: continuous pulse ox and blood pressure Approach: midline Location: L3-L4 Injection technique: LOR air  Needle:  Needle type: Tuohy  Needle gauge: 17 G Needle length: 9 cm and 9 Needle insertion depth: 5 cm cm Catheter type: closed end flexible Catheter size: 19 Gauge Catheter at skin depth: 11 cm Test dose: negative and Other  Assessment Sensory level: T9 Events: blood not aspirated, injection not painful, no injection resistance, negative IV test and no paresthesia  Additional Notes Reason for block:procedure for pain

## 2015-12-01 NOTE — H&P (Signed)
LABOR AND DELIVERY ADMISSION HISTORY AND PHYSICAL NOTE  Peggy Savage is a 25 y.o. female G1P0000 with IUP at [redacted]w[redacted]d by LMP presenting for active labor.   She reports positive fetal movement. She denies leakage of fluid or vaginal bleeding.  Prenatal History/Complications:  Past Medical History: Past Medical History:  Diagnosis Date  . Anemia     Past Surgical History: Past Surgical History:  Procedure Laterality Date  . NO PAST SURGERIES      Obstetrical History: OB History    Gravida Para Term Preterm AB Living   1 0 0 0 0 0   SAB TAB Ectopic Multiple Live Births   0 0 0 0        Social History: Social History   Social History  . Marital status: Married    Spouse name: N/A  . Number of children: N/A  . Years of education: N/A   Social History Main Topics  . Smoking status: Never Smoker  . Smokeless tobacco: Never Used  . Alcohol use No  . Drug use: No  . Sexual activity: Yes    Birth control/ protection: None   Other Topics Concern  . None   Social History Narrative  . None    Family History: Family History  Problem Relation Age of Onset  . Diabetes Mother   . Hypertension Mother   . Diabetes Father   . Hypertension Father     Allergies: No Known Allergies  Prescriptions Prior to Admission  Medication Sig Dispense Refill Last Dose  . Prenatal Vit-Fe Phos-FA-Omega (VITAFOL GUMMIES) 3.33-0.333-34.8 MG CHEW Chew 3 tablets by mouth daily. 90 tablet 12 Past Month at Unknown time     Review of Systems   All systems reviewed and negative except as stated in HPI  Resp. rate 16, last menstrual period 02/21/2015. General appearance: alert, cooperative and no distress Lungs: clear to auscultation bilaterally Heart: regular rate and rhythm Abdomen: soft, non-tender; bowel sounds normal Extremities: No calf swelling or tenderness Presentation: cephalic Fetal monitoring: cat 1 Uterine activity: UCs q4-88min Dilation: 7 Effacement (%):  100 Station: 0 Exam by:: [name] RNC   Prenatal labs: ABO, Rh: A/POS/-- (01/24 1108) Antibody: NEG (01/24 1108) Rubella: !Error! RPR: Non Reactive (05/16 1130)  HBsAg: NEGATIVE (01/24 1108)  HIV: Non Reactive (05/16 1130)  GBS: Positive (07/11 1303)  1 hr Glucola: 3rd trimester normal Genetic screening:  Too late Anatomy US: normal female  Prenatal Transfer Tool  Maternal Diabetes: No Genetic Screening: Declined Maternal Ultrasounds/Referrals: Normal Fetal Ultrasounds or other Referrals:  None Maternal Substance Abuse:  No Significant Maternal Medications:  None Significant Maternal Lab Results: Lab values include: Group B Strep positive  Results for orders placed or performed during the hospital encounter of 12/01/15 (from the past 24 hour(s))  CBC   Collection Time: 12/01/15 10:55 AM  Result Value Ref Range   WBC 10.6 (H) 4.0 - 10.5 K/uL   RBC 4.53 3.87 - 5.11 MIL/uL   Hemoglobin 11.9 (L) 12.0 - 15.0 g/dL   HCT 35.5 (L) 36.0 - 46.0 %   MCV 78.4 78.0 - 100.0 fL   MCH 26.3 26.0 - 34.0 pg   MCHC 33.5 30.0 - 36.0 g/dL   RDW 16.7 (H) 11.5 - 15.5 %   Platelets 219 150 - 400 K/uL  Comprehensive metabolic panel   Collection Time: 12/01/15 10:55 AM  Result Value Ref Range   Sodium 135 135 - 145 mmol/L   Potassium 4.1 3.5 - 5.1 mmol/L  Chloride 107 101 - 111 mmol/L   CO2 18 (L) 22 - 32 mmol/L   Glucose, Bld 87 65 - 99 mg/dL   BUN 6 6 - 20 mg/dL   Creatinine, Ser 0.53 0.44 - 1.00 mg/dL   Calcium 9.2 8.9 - 10.3 mg/dL   Total Protein 7.0 6.5 - 8.1 g/dL   Albumin 3.4 (L) 3.5 - 5.0 g/dL   AST 26 15 - 41 U/L   ALT 19 14 - 54 U/L   Alkaline Phosphatase 180 (H) 38 - 126 U/L   Total Bilirubin 0.4 0.3 - 1.2 mg/dL   GFR calc non Af Amer >60 >60 mL/min   GFR calc Af Amer >60 >60 mL/min   Anion gap 10 5 - 15  Results for orders placed or performed in visit on 12/01/15 (from the past 24 hour(s))  POCT Urinalysis Dipstick   Collection Time: 12/01/15 10:04 AM  Result Value Ref  Range   Color, UA yellow    Clarity, UA clear    Glucose, UA neg    Bilirubin, UA neg    Ketones, UA neg    Spec Grav, UA 1.015    Blood, UA neg    pH, UA 6.0    Protein, UA trace    Urobilinogen, UA negative    Nitrite, UA neg    Leukocytes, UA Negative Negative    Patient Active Problem List   Diagnosis Date Noted  . Active labor at term 12/01/2015  . Language barrier 11/10/2015  . GBS (group B Streptococcus carrier), +RV culture, currently pregnant 11/10/2015  . Supervision of normal pregnancy in third trimester 05/12/2015    Assessment: Peggy Savage is a 25 y.o. G1P0000 at [redacted]w[redacted]d here for active labor.  #Labor:expectant management #Pain: epidural #FWB: Cat 1 #ID:  GBS pos - PCN ordered #MOF: breast #MOC:POPs  Ralene Ok 12/01/2015, 12:01 PM   Midwife attestation: I have seen and examined this patient; I agree with above documentation in the resident's note.   Peggy Savage is a 25 y.o. G1P0000 here for active labor  PE: Resp 16   LMP 02/21/2015 (Exact Date)  Gen: calm comfortable, NAD Resp: normal effort, no distress Abd: gravid  ROS, labs, PMH reviewed  Plan: Admit to LD Labor: active FWB: Cat I ID: GBS pos Anticipate SVD  [name], CNM  12/01/2015, 2:08 PM

## 2015-12-02 LAB — RPR: RPR Ser Ql: NONREACTIVE

## 2015-12-02 NOTE — Lactation Note (Signed)
This note was copied from a baby's chart. Lactation Consultation Note  Environmental education officer used for arabic. Baby cueing.  Reviewed hand expression w/ mother.  L nipple more evert than R side. Mother latched baby in cradle hold on R side.  Provided pillows to bring baby to nipple height. Sucks and a few swallows observed.  Taught mother how to massage/compress breast to keep baby active. Mom encouraged to feed baby 8-12 times/24 hours and with feeding cues.  Encouraged mother to breastfeed before offering formula to help establish her milk supply. Reviewed basics. Mom made aware of our phone # for post-discharge questions.    Patient Name: Peggy Savage [id number] Date: 12/02/2015 Reason for consult: Initial assessment   Maternal Data Has patient been taught Hand Expression?: Yes Does the patient have breastfeeding experience prior to this delivery?: No  Feeding Feeding Type: Breast Fed Length of feed: 10 min  LATCH Score/Interventions Latch: Grasps breast easily, tongue down, lips flanged, rhythmical sucking. Intervention(s): Adjust position;Assist with latch;Breast massage  Audible Swallowing: A few with stimulation Intervention(s): Alternate breast massage;Hand expression  Type of Nipple: Everted at rest and after stimulation  Comfort (Breast/Nipple): Soft / non-tender     Hold (Positioning): Assistance needed to correctly position infant at breast and maintain latch.  LATCH Score: 8  Lactation Tools Discussed/Used     Consult Status Consult Status: Follow-up Date: 12/03/15 Follow-up type: In-patient    Vivianne Master [facility] 12/02/2015, 7:31 PM

## 2015-12-02 NOTE — Progress Notes (Signed)
Arabic interpreter [phone] Marcine Matar used for admission teaching and to answer questions.

## 2015-12-02 NOTE — Progress Notes (Signed)
Post Partum Day 1 Subjective: no complaints, up ad lib, voiding and tolerating PO, has some cramping and perineal pain, Taking Motrin, Breast/bottle  Objective: Blood pressure 111/70, pulse 70, temperature 97.6 F (36.4 C), temperature source Oral, resp. rate 20, last menstrual period 02/21/2015, unknown if currently breastfeeding.  Physical Exam:  General: alert, cooperative, appears stated age and no distress Lochia: appropriate Uterine Fundus: firm Incision: N/A DVT Evaluation: No evidence of DVT seen on physical exam.   Recent Labs  12/01/15 1055  HGB 11.9*  HCT 35.5*    Assessment/Plan: Plan for discharge tomorrow, Lactation consult, Pt educated on colostrum, Pt feeding with bottle d/t "no milk for several days"  and Contraception Plans POP   LOS: 1 day   Peggy Savage 12/02/2015, 7:55 AM   OB FELLOW MEDICAL STUDENT NOTE ATTESTATION  I have seen and examined this patient. Note this is a Careers information officer note and as such does not necessarily reflect the patient's plan of care. Please see Ernst Bowler Denny's note for this date of service.    Peggy Savage 12/02/2015, 3:20 PM

## 2015-12-02 NOTE — Progress Notes (Signed)
Post Partum Day #1 Subjective: no complaints, up ad lib, voiding and tolerating PO  Objective: Blood pressure 111/70, pulse 70, temperature 97.6 F (36.4 C), temperature source Oral, resp. rate 20, last menstrual period 02/21/2015, unknown if currently breastfeeding.  Physical Exam:  General: alert, cooperative and no distress Lochia: appropriate Uterine Fundus: firm Incision: no significant drainage, no significant erythema DVT Evaluation: No evidence of DVT seen on physical exam. No cords or calf tenderness. No significant calf/ankle edema.   Recent Labs  12/01/15 1055  HGB 11.9*  HCT 35.5*    Assessment/Plan: Plan for discharge tomorrow, Breastfeeding, Lactation consult and Contraception planning POP   LOS: 1 day   Morene Crocker, CNM 12/02/2015, 8:15 AM

## 2015-12-02 NOTE — Progress Notes (Signed)
UR chart review completed.  

## 2015-12-02 NOTE — Progress Notes (Signed)
Attempted multiple times to use Arabic interpreter from [place] services via phone and mobile I-pad without success to do admission teaching. Nursery RN was able to contact [place] when admitting baby and discussed safe sleep, hospital crib and supplies, and use of bulb syringe with pt and FOB. Pt speaks some English and RN able to communicate basics with her in [place]. Will try to contact Arabic interpreter in the AM.

## 2015-12-02 NOTE — Anesthesia Postprocedure Evaluation (Signed)
Anesthesia Post Note  Patient: Peggy Savage  Procedure(s) Performed: * No procedures listed *  Patient location during evaluation: Mother Baby Anesthesia Type: Epidural Level of consciousness: awake and alert and oriented Pain management: satisfactory to patient Vital Signs Assessment: post-procedure vital signs reviewed and stable Respiratory status: spontaneous breathing and nonlabored ventilation Cardiovascular status: stable Postop Assessment: no headache, no backache, no signs of nausea or vomiting, adequate PO intake and patient able to bend at knees (patient up walking) Anesthetic complications: no     Last Vitals:  Vitals:   12/02/15 0100 12/02/15 0530  BP: 113/62 111/70  Pulse: 97 70  Resp: (!) 24 20  Temp: 37.1 C 36.4 C    Last Pain:  Vitals:   12/02/15 0642  TempSrc:   PainSc: 3    Pain Goal: Patients Stated Pain Goal: 3 (12/01/15 1100)               Willa Rough

## 2015-12-03 MED ORDER — OXYCODONE-ACETAMINOPHEN 5-325 MG PO TABS: 1.0000 | ORAL_TABLET | ORAL | 0 refills | Status: DC | PRN

## 2015-12-03 MED ORDER — DOCUSATE SODIUM 100 MG PO CAPS: 100.0000 mg | ORAL_CAPSULE | Freq: Two times a day (BID) | ORAL | 1 refills | Status: DC

## 2015-12-03 MED ORDER — IBUPROFEN 600 MG PO TABS: 600.0000 mg | ORAL_TABLET | Freq: Four times a day (QID) | ORAL | 2 refills | Status: DC

## 2015-12-03 NOTE — Lactation Note (Signed)
This note was copied from a baby's chart. Lactation Consultation Note: I used dexter interpreter Pattricia Boss [phone] for my visit. Mom reports she has not put the baby to the breast since 11 pm last night. Reports she has no milk. Reviewed hand expression with mom and Colostrum noted from both breasts. Mom reassured. Encouraged to always breast feed first to promote milk supply. Asking how often to feed baby. Reviewed feeding cues and encouraged to feed whenever she sees them. No further questions at present, Encouraged to call for assist when baby wakes for next feeding.   Patient Name: Peggy Savage [id number] Date: 12/03/2015 Reason for consult: Follow-up assessment   Maternal Data Formula Feeding for Exclusion: No Has patient been taught Hand Expression?: Yes Does the patient have breastfeeding experience prior to this delivery?: No  Feeding    LATCH Score/Interventions                      Lactation Tools Discussed/Used     Consult Status Consult Status: Follow-up Date: 12/03/15 Follow-up type: In-patient    Truddie Crumble 12/03/2015, 11:11 AM

## 2015-12-03 NOTE — Discharge Summary (Signed)
Obstetric Discharge Summary Reason for Admission: onset of labor Prenatal Procedures: ultrasound Intrapartum Procedures: spontaneous vaginal delivery and manual placenta removal Postpartum Procedures: none Complications-Operative and Postpartum: 1st degree perineal laceration Hemoglobin  Date Value Ref Range Status  12/01/2015 11.9 (L) 12.0 - 15.0 g/dL Final   HCT  Date Value Ref Range Status  12/01/2015 35.5 (L) 36.0 - 46.0 % Final   Hematocrit  Date Value Ref Range Status  09/01/2015 34.7 34.0 - 46.6 % Final    Physical Exam:  General: alert, cooperative and no distress Lochia: appropriate Uterine Fundus: firm Incision: no significant drainage, no significant erythema DVT Evaluation: No evidence of DVT seen on physical exam. No cords or calf tenderness. No significant calf/ankle edema.  Discharge Diagnoses: Term Pregnancy-delivered  Discharge Information: Date: 12/03/2015 Activity: pelvic rest Diet: routine Medications: PNV, Ibuprofen, Colace and Percocet Condition: stable Instructions: refer to practice specific booklet Discharge to: home Follow-up Information    Jazma Pickel A Gillermo Poch, CNM Follow up in 2 week(s).   Specialty:  Certified Nurse Midwife Why:  Postpartum exam Contact information: [address] [place] [address] [phone]           Newborn Data: Live born female  Birth Weight: 8 lb 2 oz (3685 g) APGAR: 8, 9  Infant not discharged d/t temperatures, rooming in with infant.  Morene Crocker, CNM 12/03/2015, 8:17 AM

## 2015-12-03 NOTE — Progress Notes (Signed)
Post Partum Day #2 Subjective: no complaints, up ad lib, voiding and tolerating PO  Objective: Blood pressure 108/69, pulse 76, temperature 98 F (36.7 C), temperature source Oral, resp. rate 18, last menstrual period 02/21/2015, unknown if currently breastfeeding.  Physical Exam:  General: alert, cooperative and no distress Lochia: appropriate Uterine Fundus: firm Incision: no significant drainage DVT Evaluation: No evidence of DVT seen on physical exam. No cords or calf tenderness. No significant calf/ankle edema.   Recent Labs  12/01/15 1055  HGB 11.9*  HCT 35.5*    Assessment/Plan: Discharge home, Breastfeeding and Contraception POP   LOS: 2 days   Morene Crocker, CNM 12/03/2015, 7:52 AM

## 2015-12-16 ENCOUNTER — Ambulatory Visit (INDEPENDENT_AMBULATORY_CARE_PROVIDER_SITE_OTHER): Payer: Medicaid Other | Admitting: Obstetrics and Gynecology

## 2015-12-16 NOTE — Progress Notes (Signed)
Subjective:     Peggy Savage is a 25 y.o. female who presents for a postpartum visit. She is 2 weeks postpartum following a spontaneous vaginal delivery. I have fully reviewed the prenatal and intrapartum course. The delivery was at 40.3 gestational weeks. Outcome: spontaneous vaginal delivery. Anesthesia: epidural. Postpartum course has been uncomplicated. Baby's course has been uncomplicated. Baby is feeding by bottle - Similac Advance. Bleeding thin lochia. Bowel function is normal. Bladder function is normal. Patient is not sexually active. Contraception method is none. Postpartum depression screening: negative.     Review of Systems Pertinent items are noted in HPI.   Objective:    BP 131/85   Pulse 64   Temp 98.8 F (37.1 C) (Oral)   Wt 185 lb 9.6 oz (84.2 kg)   LMP 02/21/2015 (Exact Date)   BMI 26.63 kg/m   General:  alert, cooperative and no distress   Breasts:  inspection negative, no nipple discharge or bleeding, no masses or nodularity palpable  Lungs: clear to auscultation bilaterally  Heart:  regular rate and rhythm  Abdomen: soft, non-tender; bowel sounds normal; no masses,  no organomegaly   Vulva:  not evaluated  Vagina: not evaluated  Cervix:    Corpus: not examined  Adnexa:  not evaluated  Rectal Exam: Not performed.        Assessment:     Normal 2 week postpartum exam.    Plan:    1. Contraception: abstinence 2. Patient is doing well 3. Follow up in: 4 weeks for postpartum visit or as needed.

## 2015-12-25 ENCOUNTER — Ambulatory Visit: Payer: Medicaid Other | Admitting: Certified Nurse Midwife

## 2016-01-13 ENCOUNTER — Ambulatory Visit (INDEPENDENT_AMBULATORY_CARE_PROVIDER_SITE_OTHER): Payer: Medicaid Other | Admitting: Obstetrics and Gynecology

## 2016-01-13 ENCOUNTER — Encounter: Payer: Self-pay | Admitting: Obstetrics and Gynecology

## 2016-01-13 DIAGNOSIS — Z3049 Encounter for surveillance of other contraceptives: Secondary | ICD-10-CM

## 2016-01-13 MED ORDER — NORETHIN ACE-ETH ESTRAD-FE 1-20 MG-MCG(24) PO TABS: 1.0000 | ORAL_TABLET | Freq: Every day | ORAL | 4 refills | Status: DC

## 2016-01-13 NOTE — Progress Notes (Signed)
Subjective:     Peggy Savage is a 25 y.o. female who presents for a postpartum visit. She is 6 weeks postpartum following a spontaneous vaginal delivery. I have fully reviewed the prenatal and intrapartum course. The delivery was at 40.3 gestational weeks. Outcome: spontaneous vaginal delivery. Anesthesia: epidural. Postpartum course has been uncomplicated. Baby's course has been uncomplicated. Baby is feeding by bottle - Similac Advance. Bleeding thin lochia. Bowel function is normal. Bladder function is normal. Patient is not sexually active. Contraception method is abstinence. Postpartum depression screening: negative.     Review of Systems Pertinent items are noted in HPI.   Objective:    BP 114/70   Pulse 68   Temp 98 F (36.7 C) (Oral)   Wt 180 lb 3.2 oz (81.7 kg)   BMI 25.86 kg/m   General:  alert, cooperative and no distress   Breasts:  inspection negative, no nipple discharge or bleeding, no masses or nodularity palpable  Lungs: clear to auscultation bilaterally  Heart:  regular rate and rhythm  Abdomen: soft, non-tender; bowel sounds normal; no masses,  no organomegaly   Vulva:  normal  Vagina: normal vagina, no discharge, exudate, lesion, or erythema  Cervix:  multiparous appearance  Corpus: normal size, contour, position, consistency, mobility, non-tender  Adnexa:  no mass, fullness, tenderness  Rectal Exam: Not performed.        Assessment:     Normal postpartum exam. Pap smear not done at today's visit.   Plan:    1. Contraception: OCP (estrogen/progesterone) Rx LoEstrin provided 2. Patient is medically cleared to resume all activities of daily living 3. Follow up in: January for annual exam or as needed.

## 2016-01-13 NOTE — Progress Notes (Signed)
Patient is in office for postpartum check.

## 2016-05-01 ENCOUNTER — Emergency Department (HOSPITAL_COMMUNITY): Payer: BLUE CROSS/BLUE SHIELD

## 2016-05-01 ENCOUNTER — Encounter (HOSPITAL_COMMUNITY): Payer: Self-pay | Admitting: Emergency Medicine

## 2016-05-01 ENCOUNTER — Emergency Department (HOSPITAL_COMMUNITY)
Admission: EM | Admit: 2016-05-01 | Discharge: 2016-05-01 | Disposition: A | Discharge status: Home / Self Care | Payer: BLUE CROSS/BLUE SHIELD | Attending: Emergency Medicine | Admitting: Emergency Medicine

## 2016-05-01 ENCOUNTER — Encounter (HOSPITAL_COMMUNITY): Payer: Self-pay

## 2016-05-01 ENCOUNTER — Emergency Department (HOSPITAL_COMMUNITY)
Admission: EM | Admit: 2016-05-01 | Discharge: 2016-05-01 | Discharge status: Against Medical Advice | Payer: BLUE CROSS/BLUE SHIELD | Source: Home / Self Care | Attending: Emergency Medicine | Admitting: Emergency Medicine

## 2016-05-01 DIAGNOSIS — R519 Headache, unspecified: Secondary | ICD-10-CM

## 2016-05-01 DIAGNOSIS — Z79899 Other long term (current) drug therapy: Secondary | ICD-10-CM | POA: Diagnosis not present

## 2016-05-01 DIAGNOSIS — J329 Chronic sinusitis, unspecified: Secondary | ICD-10-CM | POA: Diagnosis not present

## 2016-05-01 DIAGNOSIS — Z853 Personal history of malignant neoplasm of breast: Secondary | ICD-10-CM | POA: Insufficient documentation

## 2016-05-01 DIAGNOSIS — R55 Syncope and collapse: Secondary | ICD-10-CM

## 2016-05-01 DIAGNOSIS — R51 Headache: Principal | ICD-10-CM

## 2016-05-01 LAB — CBC
HEMATOCRIT: 35 % — AB (ref 36.0–46.0)
HEMOGLOBIN: 11.6 g/dL — AB (ref 12.0–15.0)
MCH: 29.6 pg (ref 26.0–34.0)
MCHC: 33.1 g/dL (ref 30.0–36.0)
MCV: 89.3 fL (ref 78.0–100.0)
Platelets: 278 10*3/uL (ref 150–400)
RBC: 3.92 MIL/uL (ref 3.87–5.11)
RDW: 13.4 % (ref 11.5–15.5)
WBC: 5.8 10*3/uL (ref 4.0–10.5)

## 2016-05-01 LAB — URINALYSIS, ROUTINE W REFLEX MICROSCOPIC
BILIRUBIN URINE: NEGATIVE
GLUCOSE, UA: NEGATIVE mg/dL
HGB URINE DIPSTICK: NEGATIVE
Ketones, ur: NEGATIVE mg/dL
NITRITE: NEGATIVE
Protein, ur: NEGATIVE mg/dL
SPECIFIC GRAVITY, URINE: 1.024 (ref 1.005–1.030)
pH: 6 (ref 5.0–8.0)

## 2016-05-01 LAB — BASIC METABOLIC PANEL
Anion gap: 7 (ref 5–15)
BUN: 14 mg/dL (ref 6–20)
CHLORIDE: 108 mmol/L (ref 101–111)
CO2: 23 mmol/L (ref 22–32)
Calcium: 9.2 mg/dL (ref 8.9–10.3)
Creatinine, Ser: 0.62 mg/dL (ref 0.44–1.00)
GFR calc Af Amer: >60 mL/min (ref >60)
Glucose, Bld: 103 mg/dL — ABNORMAL HIGH (ref 65–99)
Potassium: 3.8 mmol/L (ref 3.5–5.1)
Sodium: 138 mmol/L (ref 135–145)

## 2016-05-01 LAB — CBG MONITORING, ED: GLUCOSE-CAPILLARY: 98 mg/dL (ref 65–99)

## 2016-05-01 LAB — PREGNANCY, URINE: Preg Test, Ur: NEGATIVE

## 2016-05-01 MED ORDER — ACETAMINOPHEN 500 MG PO TABS: 500.0000 mg | ORAL_TABLET | Freq: Four times a day (QID) | ORAL | 0 refills | Status: DC | PRN

## 2016-05-01 MED ORDER — ACETAMINOPHEN 500 MG PO TABS
1000.0000 mg | ORAL_TABLET | Freq: Once | ORAL | Status: AC
  Administered 2016-05-01: 1000 mg via ORAL
  Filled 2016-05-01: qty 2

## 2016-05-01 MED ORDER — AMOXICILLIN 500 MG PO CAPS: 1000.0000 mg | ORAL_CAPSULE | Freq: Two times a day (BID) | ORAL | 0 refills | Status: DC

## 2016-05-01 NOTE — ED Triage Notes (Signed)
Pt returns with c/o continues to have headache and dizziness. Pt denies N/V.

## 2016-05-01 NOTE — Discharge Instructions (Signed)
Do not hesitate to return to the emergency room for any new, worsening or concerning symptoms. ° °Please obtain primary care using resource guide below. Let them know that you were seen in the emergency room and that they will need to obtain records for further outpatient management. ° ° °

## 2016-05-01 NOTE — ED Notes (Signed)
Pt stable, understands discharge instructions, and reasons for return.   

## 2016-05-01 NOTE — ED Triage Notes (Signed)
Pt speaks french. Pt has been having headaches and feeling faint for the past week. Today had a syncopal episode, with room spinning dizzy episode prior too. No n/v or photosensitivity. C/o back pain from fall today

## 2016-05-01 NOTE — ED Notes (Addendum)
Pt states she has been having headaches for 1 week and felt lightheaded yesterday into this morning. Pt had a syncopal episode and denies hitting her head. Pt states no nausea and has a normal appetite

## 2016-05-01 NOTE — ED Notes (Signed)
Sig pad not working. Pt understands dc instructions.

## 2016-05-01 NOTE — ED Notes (Signed)
Checked CBG 98, RN Jessica informed

## 2016-05-01 NOTE — ED Provider Notes (Signed)
[place] DEPT Provider Note   CSN: [id number] Arrival date & time: 05/01/16  0001  By signing my name below, I, Evelene Croon, attest that this documentation has been prepared under the direction and in the presence of Veryl Speak, MD . Electronically Signed: Evelene Croon, Scribe. 05/01/2016. 3:10 AM.  History   Chief Complaint Chief Complaint  Patient presents with  . Headache  . Loss of Consciousness     The history is provided by the patient and a friend. No language interpreter was used.   HPI Comments:  Peggy Savage is a 26 y.o. female 5 months postpartum who presents to the Emergency Department s/p fall and near syncopal episode today. Pt felt dizzy with numbness in the left arm and weakness in her legs prior to fall. She reports associated intermittent HA that began 1 month ago but has been constant x 1 week. She notes pain to the occipital and temporal regions of her head. She has a h/o anemia but no other significant PMHx and no daily meds. Pt received an epidural during labor but had no residual weakness or complications after the birth. She denies fever, vomiting and diarrhea. Pt is not a native [place] speaker; she speaks an Nutritional therapist. History translated by friend.    Past Medical History:  Diagnosis Date  . Anemia   . 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 Active labor at term 12/01/2015  . Vaginal delivery 12/01/2015  . Language barrier 11/10/2015  . GBS (group B Streptococcus carrier), +RV culture, currently pregnant 11/10/2015  . Supervision of normal pregnancy in third trimester 05/12/2015    Past Surgical History:  Procedure Laterality Date  . NO PAST SURGERIES      OB History    Gravida Para Term Preterm AB Living   1 1 1  0 0 1   SAB TAB Ectopic Multiple Live Births   0 0 0 0 1       Home Medications    Prior to Admission medications   Medication Sig Start  Date End Date Taking? Authorizing Provider  docusate sodium (COLACE) 100 MG capsule Take 1 capsule (100 mg total) by mouth 2 (two) times daily. Patient not taking: Reported on 01/13/2016 12/03/15   Rachelle A Denney, CNM  ibuprofen (ADVIL,MOTRIN) 600 MG tablet Take 1 tablet (600 mg total) by mouth 4 (four) times daily. Patient not taking: Reported on 01/13/2016 12/03/15   Rachelle A Denney, CNM  Norethindrone Acetate-Ethinyl Estrad-FE (LOESTRIN 24 FE) 1-20 MG-MCG(24) tablet Take 1 tablet by mouth daily. 01/13/16   Peggy Constant, MD  oxyCODONE-acetaminophen (PERCOCET/ROXICET) 5-325 MG tablet Take 1-2 tablets by mouth every 4 (four) hours as needed (pain scale > 7). Patient not taking: Reported on 01/13/2016 12/03/15   Morene Crocker, CNM  Prenatal Vit-Fe Phos-FA-Omega (VITAFOL GUMMIES) 3.33-0.333-34.8 MG CHEW Chew 3 tablets by mouth daily. Patient not taking: Reported on 01/13/2016 05/12/15   Morene Crocker, CNM    Family History Family History  Problem Relation Age of Onset  . Diabetes Mother   . Hypertension Mother   . Diabetes Father   . Hypertension Father     Social History Social History  Substance Use Topics  . Smoking status: Never Smoker  . Smokeless tobacco: Never Used  . Alcohol use No     Allergies   Patient has no known allergies.   Review of Systems Review  of Systems  10 systems reviewed and all are negative for acute change except as noted in the HPI.   Physical Exam Updated Vital Signs BP 115/78   Pulse (!) 55   Temp 98 F (36.7 C)   Resp 15   Ht 5\' 9"  (1.753 m)   Wt 160 lb (72.6 kg)   SpO2 100%   BMI 23.63 kg/m   Physical Exam  Constitutional: She is oriented to person, place, and time. She appears well-developed and well-nourished. No distress.  HENT:  Head: Normocephalic and atraumatic.  Mouth/Throat: Oropharynx is clear and moist.  Eyes: EOM are normal. Pupils are equal, round, and reactive to light.  Neck: Normal range of motion.    Cardiovascular: Normal rate, regular rhythm and normal heart sounds.   Pulmonary/Chest: Effort normal and breath sounds normal.  Abdominal: Soft. She exhibits no distension. There is no tenderness.  Musculoskeletal: Normal range of motion.  Neurological: She is alert and oriented to person, place, and time. No cranial nerve deficit. She exhibits normal muscle tone. Coordination normal.  Skin: Skin is warm and dry.  Psychiatric: She has a normal mood and affect. Judgment normal.  Nursing note and vitals reviewed.    ED Treatments / Results  DIAGNOSTIC STUDIES:  Oxygen Saturation is 100% on RA, normal by my interpretation.    COORDINATION OF CARE:  3:00 AM Discussed treatment plan with pt at bedside and pt agreed to plan.  Labs (all labs ordered are listed, but only abnormal results are displayed) Labs Reviewed  BASIC METABOLIC PANEL - Abnormal; Notable for the following:       Result Value   Glucose, Bld 103 (*)    All other components within normal limits  CBC - Abnormal; Notable for the following:    Hemoglobin 11.6 (*)    HCT 35.0 (*)    All other components within normal limits  URINALYSIS, ROUTINE W REFLEX MICROSCOPIC - Abnormal; Notable for the following:    APPearance HAZY (*)    Leukocytes, UA SMALL (*)    Bacteria, UA RARE (*)    Squamous Epithelial / LPF 6-30 (*)    All other components within normal limits  PREGNANCY, URINE  CBG MONITORING, ED  I-STAT BETA HCG BLOOD, ED ([facility], [facility], [facility] ONLY)    EKG  EKG Interpretation None       Radiology No results found.  Procedures Procedures (including critical care time)  Medications Ordered in ED Medications - No data to display   Initial Impression / Assessment and Plan / ED Course  I have reviewed the triage vital signs and the nursing notes.  Pertinent labs & imaging results that were available during my care of the patient were reviewed by me and considered in my medical decision making (see chart for  details).  Clinical Course     Patient is a 27 year old female who presents with complaints of headache for the past week. She became near syncopal this evening and presents for evaluation. She is neurologically intact and laboratory studies are unremarkable. My plan was to obtain a head CT to evaluate for these symptoms. It was an extremely busy shift and there was a delay for her study to be performed. While waiting for this test, she informed the nurse that she could wait no longer and was requesting to leave. She then signed out [place].  Final Clinical Impressions(s) / ED Diagnoses   Final diagnoses:  None    New Prescriptions New Prescriptions  No medications on file   I personally performed the services described in this documentation, which was scribed in my presence. The recorded information has been reviewed and is accurate.        Veryl Speak, MD 05/01/16 0630

## 2016-05-02 NOTE — ED Provider Notes (Signed)
[place] DEPT Provider Note   CSN: [id number] Arrival date & time: 05/01/16  [place]     History   Chief Complaint Chief Complaint  Patient presents with  . Headache    HPI  Blood pressure 118/75, pulse 63, temperature 98.6 F (37 C), temperature source Oral, resp. rate 16, height 5\' 9"  (1.753 m), weight 72.6 kg, SpO2 99 %, unknown if currently breastfeeding.  Peggy Savage is a 26 y.o. female complaining of intermittent headache over the course of the last several months it is associated with dizziness. She denies any change in her vision nausea, vomiting, dysarthria, ataxia, cervicalgia, chest pain or shortness of breath. She is not taking any pain medication prior to arrival. She states that the headache is mostly right occipital and temporal region of her head. No congestion or otalgia. Patient was seen yesterday and had to leave before CT the obtained. Patient is 5 months postpartum and breast-feeding  Past Medical History:  Diagnosis Date  . Anemia   . 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 Active labor at term 12/01/2015  . Vaginal delivery 12/01/2015  . Language barrier 11/10/2015  . GBS (group B Streptococcus carrier), +RV culture, currently pregnant 11/10/2015  . Supervision of normal pregnancy in third trimester 05/12/2015    Past Surgical History:  Procedure Laterality Date  . NO PAST SURGERIES      OB History    Gravida Para Term Preterm AB Living   1 1 1  0 0 1   SAB TAB Ectopic Multiple Live Births   0 0 0 0 1       Home Medications    Prior to Admission medications   Medication Sig Start Date End Date Taking? Authorizing Provider  acetaminophen (TYLENOL) 500 MG tablet Take 1 tablet (500 mg total) by mouth every 6 (six) hours as needed. 05/01/16   Arlys Scatena, PA-C  amoxicillin (AMOXIL) 500 MG capsule Take 2 capsules (1,000 mg total) by mouth 2 (two) times daily. 05/01/16    Gunnard Dorrance, PA-C  docusate sodium (COLACE) 100 MG capsule Take 1 capsule (100 mg total) by mouth 2 (two) times daily. Patient not taking: Reported on 05/01/2016 12/03/15   Rachelle A Denney, CNM  ibuprofen (ADVIL,MOTRIN) 600 MG tablet Take 1 tablet (600 mg total) by mouth 4 (four) times daily. Patient not taking: Reported on 05/01/2016 12/03/15   Rachelle A Denney, CNM  Norethindrone Acetate-Ethinyl Estrad-FE (LOESTRIN 24 FE) 1-20 MG-MCG(24) tablet Take 1 tablet by mouth daily. Patient not taking: Reported on 05/01/2016 01/13/16   Mora Bellman, MD  oxyCODONE-acetaminophen (PERCOCET/ROXICET) 5-325 MG tablet Take 1-2 tablets by mouth every 4 (four) hours as needed (pain scale > 7). Patient not taking: Reported on 05/01/2016 12/03/15   Morene Crocker, CNM  Prenatal Vit-Fe Phos-FA-Omega (VITAFOL GUMMIES) 3.33-0.333-34.8 MG CHEW Chew 3 tablets by mouth daily. Patient not taking: Reported on 05/01/2016 05/12/15   Morene Crocker, CNM    Family History Family History  Problem Relation Age of Onset  . Diabetes Mother   . Hypertension Mother   . Diabetes Father   . Hypertension Father     Social History Social History  Substance Use Topics  . Smoking status: Never Smoker  . Smokeless tobacco: Never Used  . Alcohol use No     Allergies   Patient has no known allergies.   Review of Systems Review of Systems  10 systems reviewed and found to be negative, except as noted in the HPI.   Physical Exam Updated Vital Signs BP 118/75 (BP Location: Right Arm)   Pulse 63   Temp 98.6 F (37 C) (Oral)   Resp 16   Ht 5\' 9"  (1.753 m)   Wt 72.6 kg   SpO2 99%   BMI 23.63 kg/m   Physical Exam  Constitutional: She is oriented to person, place, and time. She appears well-developed and well-nourished. No distress.  HENT:  Head: Normocephalic and atraumatic.  Mouth/Throat: Oropharynx is clear and moist.  Eyes: Conjunctivae and EOM are normal. Pupils are equal, round, and reactive to  light.  Neck: Normal range of motion.  No midline C-spine  tenderness to palpation or step-offs appreciated. Patient has full range of motion without pain.  Grip strength, biceps, triceps 5/5 bilaterally;  can differentiate between pinprick and light touch bilaterally.   Cardiovascular: Normal rate, regular rhythm and intact distal pulses.   Pulmonary/Chest: Effort normal and breath sounds normal.  Abdominal: Soft. There is no tenderness.  Musculoskeletal: Normal range of motion.  Neurological: She is alert and oriented to person, place, and time.  Follows commands, Clear, goal oriented speech, Strength is 5 out of 5x4 extremities, patient ambulates with a coordinated in nonantalgic gait. Sensation is grossly intact.   Skin: She is not diaphoretic.  Psychiatric: She has a normal mood and affect.  Nursing note and vitals reviewed.    ED Treatments / Results  Labs (all labs ordered are listed, but only abnormal results are displayed) Labs Reviewed - No data to display  EKG  EKG Interpretation None       Radiology Ct Head Wo Contrast  Result Date: 05/01/2016 CLINICAL DATA:  One month history of headache. EXAM: CT HEAD WITHOUT CONTRAST TECHNIQUE: Contiguous axial images were obtained from the base of the skull through the vertex without intravenous contrast. COMPARISON:  No comparison. FINDINGS: Brain: There is no evidence for acute hemorrhage, hydrocephalus, mass lesion, or abnormal extra-axial fluid collection. No definite CT evidence for acute infarction. Vascular: No hyperdense vessel or unexpected calcification. Skull: No evidence for fracture. No worrisome lytic or sclerotic lesion. Sinuses/Orbits: Chronic polypoid mucosal disease noted left maxillary sinus. Remaining visualized paranasal sinuses are clear. Visualized portions of the globes and intraorbital fat are unremarkable. Other: None. IMPRESSION: Chronic left maxillary sinus disease.  Otherwise normal study. Electronically  Signed   By: Misty Stanley M.D.   On: 05/01/2016 23:06    Procedures Procedures (including critical care time)  Medications Ordered in ED Medications  acetaminophen (TYLENOL) tablet 1,000 mg (1,000 mg Oral Given 05/01/16 2338)     Initial Impression / Assessment and Plan / ED Course  I have reviewed the triage vital signs and the nursing notes.  Pertinent labs & imaging results that were available during my care of the patient were reviewed by me and considered in my medical decision making (see chart for details).  Clinical Course     Vitals:   05/01/16 1842 05/01/16 1843 05/01/16 2340  BP: 116/70  118/75  Pulse: 64  63  Resp: 18  16  Temp: 98.6 F (37 C)  98.6 F (37 C)  TempSrc: Oral  Oral  SpO2: 99%  99%  Weight:  72.6 kg   Height:  5\' 9"  (1.753 m)     Medications  acetaminophen (TYLENOL) tablet 1,000 mg (1,000 mg Oral Given 05/01/16 2338)    Peggy Savage is 26 y.o. female presenting  with Intermittent headache over the course of the month. Neurologic exam is nonfocal,(an atypical headache, she does not normally have this issue. Patient is initially stating she wants to leave the emergency department because she has taken care of her child, I have encouraged her to stay and she has acquiesced. CT with no intracranial abnormalities, they do see a sinusitis. I do not think this is responsible for her headache. I've given her referral to [facility] neurology. I don't think this is subarachnoid, no indication for LP, given her normal neurologic exam I don't think she needs an emergent MRI at this time.  Evaluation does not show pathology that would require ongoing emergent intervention or inpatient treatment. Pt is hemodynamically stable and mentating appropriately. Discussed findings and plan with patient/guardian, who agrees with care plan. All questions answered. Return precautions discussed and outpatient follow up given.    Final Clinical Impressions(s) / ED  Diagnoses   Final diagnoses:  Acute nonintractable headache, unspecified headache type  Sinusitis, unspecified chronicity, unspecified location    New Prescriptions Discharge Medication List as of 05/01/2016 11:41 PM    START taking these medications   Details  acetaminophen (TYLENOL) 500 MG tablet Take 1 tablet (500 mg total) by mouth every 6 (six) hours as needed., Starting Sun 05/01/2016, Print    amoxicillin (AMOXIL) 500 MG capsule Take 2 capsules (1,000 mg total) by mouth 2 (two) times daily., Starting Sun 05/01/2016, [employer] Satsuki Zillmer, PA-C 05/02/16 [id number]    Gareth Morgan, MD 05/04/16 [phone]

## 2016-09-26 ENCOUNTER — Ambulatory Visit (INDEPENDENT_AMBULATORY_CARE_PROVIDER_SITE_OTHER): Payer: BLUE CROSS/BLUE SHIELD | Admitting: Obstetrics and Gynecology

## 2016-09-26 ENCOUNTER — Encounter: Payer: Self-pay | Admitting: Obstetrics and Gynecology

## 2016-09-26 VITALS — BP 110/67 | HR 71 | Wt 190.0 lb

## 2016-09-26 DIAGNOSIS — N912 Amenorrhea, unspecified: Secondary | ICD-10-CM

## 2016-09-26 DIAGNOSIS — Z3202 Encounter for pregnancy test, result negative: Secondary | ICD-10-CM

## 2016-09-26 LAB — POCT URINE PREGNANCY: Preg Test, Ur: NEGATIVE

## 2016-09-26 NOTE — Progress Notes (Signed)
Pt c/o amenorrhea x 4 mos. NSVD 12/01/15. Pt breast fed 3 mos after delivery. Normal pap 03/26/15

## 2016-09-26 NOTE — Progress Notes (Signed)
26 yo G1P1 presenting today for the evaluation of 4 month history of amenorrhea. Patient reports normal menses following delivery with bleeding for 7 days every months until 4 months ago. She reports being treated for a sinus infection and and dental infection and ever since she has been amenorrheic. She is sexually active without contraception. She reports negative pregnancy test at home. She denies any pelvic pain or abnormal discharge  Past Medical History:  Diagnosis Date  . Anemia   . 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 NO PAST SURGERIES     Family History  Problem Relation Age of Onset  . Diabetes Mother   . Hypertension Mother   . Diabetes Father   . Hypertension Father    Social History  Substance Use Topics  . Smoking status: Never Smoker  . Smokeless tobacco: Never Used  . Alcohol use No   ROS See pertinent in HPI  Blood pressure 110/67, pulse 71, weight 190 lb (86.2 kg), not currently breastfeeding. GENERAL: Well-developed, well-nourished female in no acute distress.  ABDOMEN: Soft, nontender, nondistended. No organomegaly. PELVIC: Not indicated EXTREMITIES: No cyanosis, clubbing, or edema, 2+ distal pulses.  A/P 26 yo with amenorrhea - UPT negative - FSH, LH, TSH, prolactin levels ordered - pelvic ultrasound ordered - discussed provera challenge test - Patient will be provided with results and instructions on provera challenge test upon review of results

## 2016-09-27 ENCOUNTER — Other Ambulatory Visit: Payer: Self-pay | Admitting: Obstetrics and Gynecology

## 2016-09-27 ENCOUNTER — Telehealth: Payer: Self-pay

## 2016-09-27 LAB — FOLLICLE STIMULATING HORMONE: FSH: 4 m[IU]/mL

## 2016-09-27 LAB — PROLACTIN: Prolactin: 19.6 ng/mL (ref 4.8–23.3)

## 2016-09-27 LAB — TSH: TSH: 1.45 u[IU]/mL (ref 0.450–4.500)

## 2016-09-27 MED ORDER — MEDROXYPROGESTERONE ACETATE 10 MG PO TABS: 10.0000 mg | ORAL_TABLET | Freq: Every day | ORAL | 2 refills | Status: DC

## 2016-09-27 NOTE — Telephone Encounter (Signed)
Pt advised of results, and that rx has been sent to the pharmacy. Pt advised to take medication for 10 days, and that if she does not have a cycle by day 5 to call our office

## 2016-10-05 ENCOUNTER — Ambulatory Visit (HOSPITAL_COMMUNITY)
Admission: RE | Admit: 2016-10-05 | Discharge: 2016-10-05 | Disposition: A | Discharge status: Home / Self Care | Payer: BLUE CROSS/BLUE SHIELD | Source: Ambulatory Visit | Attending: Obstetrics and Gynecology | Admitting: Obstetrics and Gynecology

## 2016-10-05 DIAGNOSIS — N912 Amenorrhea, unspecified: Secondary | ICD-10-CM | POA: Insufficient documentation

## 2016-10-05 DIAGNOSIS — R938 Abnormal findings on diagnostic imaging of other specified body structures: Secondary | ICD-10-CM | POA: Insufficient documentation

## 2016-10-06 ENCOUNTER — Telehealth: Payer: Self-pay

## 2016-10-06 ENCOUNTER — Emergency Department (HOSPITAL_COMMUNITY)
Admission: EM | Admit: 2016-10-06 | Discharge: 2016-10-06 | Disposition: A | Discharge status: Home / Self Care | Payer: BLUE CROSS/BLUE SHIELD | Attending: Emergency Medicine | Admitting: Emergency Medicine

## 2016-10-06 ENCOUNTER — Encounter (HOSPITAL_COMMUNITY): Payer: Self-pay

## 2016-10-06 DIAGNOSIS — Z331 Pregnant state, incidental: Secondary | ICD-10-CM | POA: Diagnosis not present

## 2016-10-06 DIAGNOSIS — Z3A01 Less than 8 weeks gestation of pregnancy: Secondary | ICD-10-CM | POA: Diagnosis not present

## 2016-10-06 DIAGNOSIS — R109 Unspecified abdominal pain: Secondary | ICD-10-CM | POA: Diagnosis present

## 2016-10-06 LAB — CBC
HEMATOCRIT: 33 % — AB (ref 36.0–46.0)
Hemoglobin: 10.5 g/dL — ABNORMAL LOW (ref 12.0–15.0)
MCH: 26.4 pg (ref 26.0–34.0)
MCHC: 31.8 g/dL (ref 30.0–36.0)
MCV: 83.1 fL (ref 78.0–100.0)
Platelets: 327 10*3/uL (ref 150–400)
RBC: 3.97 MIL/uL (ref 3.87–5.11)
RDW: 14.7 % (ref 11.5–15.5)
WBC: 7.5 10*3/uL (ref 4.0–10.5)

## 2016-10-06 LAB — COMPREHENSIVE METABOLIC PANEL
ALBUMIN: 3.9 g/dL (ref 3.5–5.0)
ALT: 16 U/L (ref 14–54)
AST: 19 U/L (ref 15–41)
Alkaline Phosphatase: 47 U/L (ref 38–126)
Anion gap: 8 (ref 5–15)
BILIRUBIN TOTAL: 0.5 mg/dL (ref 0.3–1.2)
BUN: 8 mg/dL (ref 6–20)
CO2: 20 mmol/L — AB (ref 22–32)
Calcium: 9.2 mg/dL (ref 8.9–10.3)
Chloride: 104 mmol/L (ref 101–111)
Creatinine, Ser: 0.57 mg/dL (ref 0.44–1.00)
GFR calc Af Amer: >60 mL/min (ref >60)
GFR calc non Af Amer: >60 mL/min (ref >60)
GLUCOSE: 124 mg/dL — AB (ref 65–99)
POTASSIUM: 3.2 mmol/L — AB (ref 3.5–5.1)
SODIUM: 132 mmol/L — AB (ref 135–145)
TOTAL PROTEIN: 6.9 g/dL (ref 6.5–8.1)

## 2016-10-06 LAB — URINALYSIS, ROUTINE W REFLEX MICROSCOPIC
BILIRUBIN URINE: NEGATIVE
GLUCOSE, UA: NEGATIVE mg/dL
HGB URINE DIPSTICK: NEGATIVE
KETONES UR: NEGATIVE mg/dL
Leukocytes, UA: NEGATIVE
NITRITE: NEGATIVE
PH: 5 (ref 5.0–8.0)
Protein, ur: NEGATIVE mg/dL
SPECIFIC GRAVITY, URINE: 1.029 (ref 1.005–1.030)

## 2016-10-06 LAB — I-STAT BETA HCG BLOOD, ED (MC, WL, AP ONLY): I-stat hCG, quantitative: 253.3 m[IU]/mL — ABNORMAL HIGH (ref <5)

## 2016-10-06 LAB — LIPASE, BLOOD: Lipase: 36 U/L (ref 11–51)

## 2016-10-06 NOTE — ED Notes (Signed)
Pt departed in NAD, refused use of wheelchair.  

## 2016-10-06 NOTE — Discharge Instructions (Signed)
Your pregnancy hormone level tonight is 253. You will need to follow up in the [facility] on Monday morning at 11 am to have the test repeated. The numbers should go up. If you have problems before then go to [facility] for further evaluation.

## 2016-10-06 NOTE — ED Notes (Addendum)
ED Provider at bedside, utilizing [employer]preter.

## 2016-10-06 NOTE — ED Triage Notes (Signed)
Pt reports abdominal pain, nausea, vomiting and back pain. LMP 3 months ago and pt requesting pregnancy test

## 2016-10-06 NOTE — ED Provider Notes (Signed)
[facility] DEPT Provider Note   CSN: [id number] Arrival date & time: 10/06/16  1093  By signing my name below, I, Dora Sims, attest that this documentation has been prepared under the direction and in the presence of [place]. Janit Bern, NP. Electronically Signed: Dora Sims, Scribe. 10/06/2016. 7:59 PM.  History   Chief Complaint Chief Complaint  Patient presents with  . Abdominal Pain   The history is provided by the patient. A language interpreter was used.  Possible Pregnancy  This is a new problem. The problem occurs constantly. The problem has not changed since onset.Pertinent negatives include no chest pain, no headaches and no shortness of breath. Nothing aggravates the symptoms. Nothing relieves the symptoms.    HPI Comments: Peggy Savage is a 26 y.o. female who presents to the Emergency Department for evaluation of four months of amenorrhea. Patient was evaluated at [facility] for the same on 6/11 and reported that she had a negative HPT at that time. She returned today for ultrasound imaging. On review of the u/s it revealed abnormal endometrial thickness. She additionally took a urine pregnancy test today that was positive. after the first visit the patient was discharged with a 10 day course of Provera and took it as instructed but is still not menstruating. She is also reporting some abdominal pain, back pain, and urinary frequency for a few days. Patient is here primarily for a pregnancy test and discussion of what she should do about the second round of Provera.  She has taken several OTC pregnancy tests in the last few days and each was positive. Per the note from 6/11, patient is sexually active without contraception. She denies dysuria or any other associated symptoms today. She states that she called the office after the ultrasound today and was told to take the medication. She told them she had a positive pregnancy test and the pharmacy told her not to take  the medication. The office person told the patient that a doctor would call her back but no one ever did so the patient came to [facility] to discuss her concerns.   OB/GYN: Dr. Elly Modena  Past Medical History:  Diagnosis Date  . Anemia   . 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 Active labor at term 12/01/2015  . Vaginal delivery 12/01/2015  . Language barrier 11/10/2015  . GBS (group B Streptococcus carrier), +RV culture, currently pregnant 11/10/2015  . Supervision of normal pregnancy in third trimester 05/12/2015    Past Surgical History:  Procedure Laterality Date  . NO PAST SURGERIES      OB History    Gravida Para Term Preterm AB Living   1 1 1  0 0 1   SAB TAB Ectopic Multiple Live Births   0 0 0 0 1       Home Medications    Prior to Admission medications   Medication Sig Start Date End Date Taking? Authorizing Provider  medroxyPROGESTERone (PROVERA) 10 MG tablet Take 1 tablet (10 mg total) by mouth daily. Use for ten days 09/27/16   Constant, Peggy, MD    Family History Family History  Problem Relation Age of Onset  . Diabetes Mother   . Hypertension Mother   . Diabetes Father   . Hypertension Father     Social History Social History  Substance Use Topics  . Smoking status: Never Smoker  . Smokeless tobacco:  Never Used  . Alcohol use No     Allergies   Patient has no known allergies.   Review of Systems Review of Systems  Constitutional: Negative for fever.  HENT: Negative.   Eyes: Negative for visual disturbance.  Respiratory: Negative for shortness of breath.   Cardiovascular: Negative for chest pain.  Gastrointestinal: Negative for nausea and vomiting.  Genitourinary: Positive for frequency and menstrual problem. Negative for dysuria, vaginal bleeding and vaginal discharge.  Musculoskeletal: Positive for back pain.  Skin: Negative for rash.  Neurological: Negative for  headaches.  Psychiatric/Behavioral: The patient is not nervous/anxious.    Physical Exam Updated Vital Signs BP 110/74 (BP Location: Right Arm)   Pulse 80   Temp 98.4 F (36.9 C) (Oral)   Resp 18   SpO2 100%   Physical Exam  Constitutional: She appears well-developed and well-nourished. No distress.  HENT:  Head: Normocephalic and atraumatic.  Eyes: Conjunctivae and EOM are normal.  Neck: Neck supple. No tracheal deviation present.  Cardiovascular: Normal rate.   Pulmonary/Chest: Effort normal. No respiratory distress.  Abdominal: Soft. There is no tenderness.  Musculoskeletal: Normal range of motion.  Neurological: She is alert.  Skin: Skin is warm and dry.  Psychiatric: She has a normal mood and affect.  Nursing note and vitals reviewed.  ED Treatments / Results  Labs (all labs ordered are listed, but only abnormal results are displayed) Labs Reviewed  COMPREHENSIVE METABOLIC PANEL - Abnormal; Notable for the following:       Result Value   Sodium 132 (*)    Potassium 3.2 (*)    CO2 20 (*)    Glucose, Bld 124 (*)    All other components within normal limits  CBC - Abnormal; Notable for the following:    Hemoglobin 10.5 (*)    HCT 33.0 (*)    All other components within normal limits  URINALYSIS, ROUTINE W REFLEX MICROSCOPIC - Abnormal; Notable for the following:    APPearance HAZY (*)    All other components within normal limits  I-STAT BETA HCG BLOOD, ED ([facility], [facility], [facility] ONLY) - Abnormal; Notable for the following:    I-stat hCG, quantitative 253.3 (*)    All other components within normal limits  LIPASE, BLOOD    Radiology US Transvaginal Non-ob  Result Date: 10/05/2016 CLINICAL DATA:  Amenorrhea EXAM: TRANSABDOMINAL AND TRANSVAGINAL ULTRASOUND OF PELVIS TECHNIQUE: Both transabdominal and transvaginal ultrasound examinations of the pelvis were performed. Transabdominal technique was performed for global imaging of the pelvis including uterus, ovaries, adnexal  regions, and pelvic cul-de-sac. It was necessary to proceed with endovaginal exam following the transabdominal exam to visualize the uterus, endometrium, ovaries and adnexa. COMPARISON:  None FINDINGS: Uterus Measurements: 10.8 x 3.8 x 7.3 cm. Small anterior subserosal fibroid measures 9 mm. No fibroids or other mass visualized. Endometrium Thickness: Thickened and heterogeneous with vascular components, measuring up to 39 mm in thickness. Right ovary Measurements: 3.7 x 2.2 x 2.7 cm. Normal appearance/no adnexal mass. Left ovary Measurements: 2.9 x 1.8 x 2.2 cm. Normal appearance/no adnexal mass. Other findings No abnormal free fluid. IMPRESSION: Markedly thickened, heterogeneous and complex appearing endometrium measuring up to 39 mm in thickness. Endometrial thickness is considered abnormal. Consider follow-up by [place] in 6-8 weeks, during the week immediately following menses (exam timing is critical). Electronically Signed   By: Rolm Baptise M.D.   On: 10/05/2016 12:43   US Pelvis Complete  Result Date: 10/05/2016 CLINICAL DATA:  Amenorrhea EXAM: TRANSABDOMINAL AND TRANSVAGINAL  ULTRASOUND OF PELVIS TECHNIQUE: Both transabdominal and transvaginal ultrasound examinations of the pelvis were performed. Transabdominal technique was performed for global imaging of the pelvis including uterus, ovaries, adnexal regions, and pelvic cul-de-sac. It was necessary to proceed with endovaginal exam following the transabdominal exam to visualize the uterus, endometrium, ovaries and adnexa. COMPARISON:  None FINDINGS: Uterus Measurements: 10.8 x 3.8 x 7.3 cm. Small anterior subserosal fibroid measures 9 mm. No fibroids or other mass visualized. Endometrium Thickness: Thickened and heterogeneous with vascular components, measuring up to 39 mm in thickness. Right ovary Measurements: 3.7 x 2.2 x 2.7 cm. Normal appearance/no adnexal mass. Left ovary Measurements: 2.9 x 1.8 x 2.2 cm. Normal appearance/no adnexal mass. Other  findings No abnormal free fluid. IMPRESSION: Markedly thickened, heterogeneous and complex appearing endometrium measuring up to 39 mm in thickness. Endometrial thickness is considered abnormal. Consider follow-up by [place] in 6-8 weeks, during the week immediately following menses (exam timing is critical). Electronically Signed   By: Rolm Baptise M.D.   On: 10/05/2016 12:43    Procedures Procedures (including critical care time)  DIAGNOSTIC STUDIES: Oxygen Saturation is 100% on RA, normal by my interpretation.    COORDINATION OF CARE: 7:44 PM Discussed treatment plan with pt at bedside and pt agreed to plan.  Medications Ordered in ED Medications - No data to display   Initial Impression / Assessment and Plan / ED Course  I have reviewed the triage vital signs and the nursing notes.  Pertinent labs & imaging results that were available during my care of the patient were reviewed by me and considered in my medical decision making (see chart for details).  8:32 PM: Spoke to Dr. Ihor Dow of OB/GYN. She advised that patient follow-up for a beta hCG in her clinic on Monday 6/25 at 11am  9:38 PM: Spoke to Maureen Chatters, the nurse midwife in maternity admissions because patient cannot come in for the appointment on Monday 6/25 due to lack of transportation. She will instead come in on Sunday 6/24 for the follow-up beta hCG.   Final Clinical Impressions(s) / ED Diagnoses  26 y.o. female with positive pregnancy test stable for d/c without pain. She will f/u at [facility] on Sunday 10/09/16.  Final diagnoses:  Less than [redacted] weeks gestation of pregnancy    New Prescriptions Discharge Medication List as of 10/06/2016  8:41 PM    I personally performed the services described in this documentation, which was scribed in my presence. The recorded information has been reviewed and is accurate.    Debroah Baller [place], [place] 10/07/16 6546    Jola Schmidt, MD 10/10/16 [phone]

## 2016-10-07 NOTE — Telephone Encounter (Signed)
Contacted patient to inform of u/s results and medication management. Patient stated that she took 3 at home pregnancy tests that were positive. Patient stated that she is already aware to not take the provera, advised that I would message provider for review.

## 2016-10-09 ENCOUNTER — Inpatient Hospital Stay (HOSPITAL_COMMUNITY)
Admission: AD | Admit: 2016-10-09 | Discharge: 2016-10-09 | Disposition: A | Discharge status: Home / Self Care | Payer: BLUE CROSS/BLUE SHIELD | Source: Ambulatory Visit | Attending: Obstetrics & Gynecology | Admitting: Obstetrics & Gynecology

## 2016-10-09 ENCOUNTER — Encounter (HOSPITAL_COMMUNITY): Payer: Self-pay | Admitting: *Deleted

## 2016-10-09 DIAGNOSIS — Z3A00 Weeks of gestation of pregnancy not specified: Secondary | ICD-10-CM | POA: Insufficient documentation

## 2016-10-09 DIAGNOSIS — O26891 Other specified pregnancy related conditions, first trimester: Secondary | ICD-10-CM | POA: Insufficient documentation

## 2016-10-09 DIAGNOSIS — O3680X0 Pregnancy with inconclusive fetal viability, not applicable or unspecified: Secondary | ICD-10-CM | POA: Diagnosis not present

## 2016-10-09 LAB — HCG, QUANTITATIVE, PREGNANCY: HCG, BETA CHAIN, QUANT, S: 998 m[IU]/mL — AB (ref <5)

## 2016-10-09 NOTE — MAU Provider Note (Signed)
History   Chief Complaint:  Follow-up   Peggy Savage is  26 y.o. G2P1001  Client is here as a follow up from [facility] ER visit on 10-06-16 when she had a positive pregnancy test and was having abdominal pain.  Was seen in the [facility] clinic on 09-26-16 and had a negative urine pregnancy test. Patient is here for follow up and ongoing surveillance of pregnancy status.     Since her last visit, the patient is without new complaint.   The patient reports no bleeding and no pain today.   Her previous Quantitative HCG values are: Istat at [facility] 253 on 10-06-16  Arabic interpreter by phone used to communicate with client during this visit.   Physical Exam   Blood pressure (!) 84/68, pulse 74, temperature 98.5 F (36.9 C), resp. rate 16, weight 196 lb (88.9 kg), SpO2 97 %, not currently breastfeeding.  General ROS:  GENERAL: Well-developed, well-nourished female in no acute distress.  HEENT: Normocephalic, atraumatic.  LUNGS: Effort normal  HEART: Regular rate  SKIN: Warm, dry and without erythema  PSYCH: Normal mood and affect    Labs:  Results for orders placed or performed during the hospital encounter of 10/09/16 (from the past 24 hour(s))  hCG, quantitative, pregnancy     Status: Abnormal   Collection Time: 10/09/16  7:21 PM  Result Value Ref Range   hCG, Beta Chain, Quant, S 998 (H) <5 mIU/mL   Consult with Dr. Elonda Husky about the plan of care.  Ultrasound Studies:   The following result is an ultrasound that was ordered by [facility] clinic and client had this on 10-05-16 prior to positive pregnancy test on 10-06-16.  US Transvaginal Non-ob  Result Date: 10/05/2016 CLINICAL DATA:  Amenorrhea EXAM: TRANSABDOMINAL AND TRANSVAGINAL ULTRASOUND OF PELVIS TECHNIQUE: Both transabdominal and transvaginal ultrasound examinations of the pelvis were performed. Transabdominal technique was performed for global imaging of the pelvis including uterus, ovaries, adnexal regions, and pelvic cul-de-sac. It was  necessary to proceed with endovaginal exam following the transabdominal exam to visualize the uterus, endometrium, ovaries and adnexa. COMPARISON:  None FINDINGS: Uterus Measurements: 10.8 x 3.8 x 7.3 cm. Small anterior subserosal fibroid measures 9 mm. No fibroids or other mass visualized. Endometrium Thickness: Thickened and heterogeneous with vascular components, measuring up to 39 mm in thickness. Right ovary Measurements: 3.7 x 2.2 x 2.7 cm. Normal appearance/no adnexal mass. Left ovary Measurements: 2.9 x 1.8 x 2.2 cm. Normal appearance/no adnexal mass. Other findings No abnormal free fluid. IMPRESSION: Markedly thickened, heterogeneous and complex appearing endometrium measuring up to 39 mm in thickness. Endometrial thickness is considered abnormal. Consider follow-up by [place] in 6-8 weeks, during the week immediately following menses (exam timing is critical). Electronically Signed   By: Rolm Baptise M.D.   On: 10/05/2016 12:43   US Pelvis Complete  Result Date: 10/05/2016 CLINICAL DATA:  Amenorrhea EXAM: TRANSABDOMINAL AND TRANSVAGINAL ULTRASOUND OF PELVIS TECHNIQUE: Both transabdominal and transvaginal ultrasound examinations of the pelvis were performed. Transabdominal technique was performed for global imaging of the pelvis including uterus, ovaries, adnexal regions, and pelvic cul-de-sac. It was necessary to proceed with endovaginal exam following the transabdominal exam to visualize the uterus, endometrium, ovaries and adnexa. COMPARISON:  None FINDINGS: Uterus Measurements: 10.8 x 3.8 x 7.3 cm. Small anterior subserosal fibroid measures 9 mm. No fibroids or other mass visualized. Endometrium Thickness: Thickened and heterogeneous with vascular components, measuring up to 39 mm in thickness. Right ovary Measurements: 3.7 x 2.2 x 2.7 cm. Normal  appearance/no adnexal mass. Left ovary Measurements: 2.9 x 1.8 x 2.2 cm. Normal appearance/no adnexal mass. Other findings No abnormal free fluid. IMPRESSION:  Markedly thickened, heterogeneous and complex appearing endometrium measuring up to 39 mm in thickness. Endometrial thickness is considered abnormal. Consider follow-up by [place] in 6-8 weeks, during the week immediately following menses (exam timing is critical). Electronically Signed   By: Rolm Baptise M.D.   On: 10/05/2016 12:43    Assessment:  Pregnancy of unknown anatomic location  Plan: The patient is instructed to expect a call from Ultrasound to schedule [place] on Thursday.  Client prefers to be scheduled at 4:30 pm as that is when her husband is off work and can bring her.  Message sent to [place] and to [facility] clinic with this information. Pelvic rest discussed. Nothing in the vagina until the next visit.  No sex. Advised no heavy lifting. Advised to return to MAU with any increasing lower abdominal pain or vaginal bleeding. Client in agreement with this plan.    Chevy Sweigert L Norene Oliveri 10/09/2016, 7:53 PM

## 2016-10-09 NOTE — MAU Note (Signed)
Pt states she is here for follow up blood work. Pt states she was originally told she was not pregnant. Pt states she was seen at another ED for back and abdominal pain and was told that she was pregnant. States back pain is gone and abdominal pain is the same. Pain is intermittent. Pt denies pain at this time. Pt was given medication to help her period to "come on". States she was told by other hospital that she would have an ultrasound today to "check baby's heartbeat."

## 2016-10-10 ENCOUNTER — Telehealth: Payer: Self-pay

## 2016-10-10 ENCOUNTER — Other Ambulatory Visit: Payer: Self-pay | Admitting: Obstetrics and Gynecology

## 2016-10-10 MED ORDER — VITAFOL ULTRA 29-0.6-0.4-200 MG PO CAPS: 1.0000 | ORAL_CAPSULE | Freq: Every day | ORAL | 12 refills | Status: DC

## 2016-10-10 NOTE — Telephone Encounter (Signed)
Contacted pt via interpreter, and informed her that PNV were sent to the pharmacy by the provider, and also to let her know that the provera she was previously taking was not harmful to the fetus. Pt stated that she understood.

## 2016-10-11 ENCOUNTER — Telehealth: Payer: Self-pay | Admitting: *Deleted

## 2016-10-11 NOTE — Telephone Encounter (Signed)
Telephone call to patient using [place] id# [phone].  Patient was not in and her voice mail has not been set up.  Will need to try again later.  Patient is scheduled for follow up ultrasound on 10/18/16 at 3;45pm at [facility].  This was the first available appointment.  10/13/16 at 4:30 which was requested by patient was not available.  Patient will need to arrive at 3:30 pm with a full bladder.  Appointment messaged to provider for confirmation that later date is clinically approved.

## 2016-10-18 ENCOUNTER — Ambulatory Visit (HOSPITAL_COMMUNITY)
Admission: RE | Admit: 2016-10-18 | Discharge: 2016-10-18 | Disposition: A | Discharge status: Home / Self Care | Payer: BLUE CROSS/BLUE SHIELD | Source: Ambulatory Visit | Attending: Nurse Practitioner | Admitting: Nurse Practitioner

## 2016-10-18 DIAGNOSIS — Z3A01 Less than 8 weeks gestation of pregnancy: Secondary | ICD-10-CM | POA: Insufficient documentation

## 2016-10-18 DIAGNOSIS — Z3490 Encounter for supervision of normal pregnancy, unspecified, unspecified trimester: Secondary | ICD-10-CM | POA: Diagnosis not present

## 2016-10-18 DIAGNOSIS — O3680X0 Pregnancy with inconclusive fetal viability, not applicable or unspecified: Secondary | ICD-10-CM

## 2016-10-24 ENCOUNTER — Other Ambulatory Visit: Payer: Self-pay

## 2016-10-24 ENCOUNTER — Encounter: Payer: Self-pay | Admitting: Obstetrics

## 2016-10-24 MED ORDER — DOXYLAMINE-PYRIDOXINE 10-10 MG PO TBEC: 1.0000 | DELAYED_RELEASE_TABLET | Freq: Three times a day (TID) | ORAL | 3 refills | Status: DC

## 2016-10-24 MED ORDER — VITAFOL ULTRA 29-0.6-0.4-200 MG PO CAPS: 1.0000 | ORAL_CAPSULE | Freq: Every day | ORAL | 12 refills | Status: DC

## 2016-11-21 ENCOUNTER — Other Ambulatory Visit (HOSPITAL_COMMUNITY)
Admission: RE | Admit: 2016-11-21 | Discharge: 2016-11-21 | Disposition: A | Discharge status: Home / Self Care | Payer: Medicaid Other | Source: Ambulatory Visit | Attending: Certified Nurse Midwife | Admitting: Certified Nurse Midwife

## 2016-11-21 ENCOUNTER — Encounter: Payer: Self-pay | Admitting: Certified Nurse Midwife

## 2016-11-21 ENCOUNTER — Ambulatory Visit (INDEPENDENT_AMBULATORY_CARE_PROVIDER_SITE_OTHER): Payer: Medicaid Other | Admitting: Certified Nurse Midwife

## 2016-11-21 VITALS — BP 123/74 | HR 98 | Wt 193.4 lb

## 2016-11-21 DIAGNOSIS — Z3480 Encounter for supervision of other normal pregnancy, unspecified trimester: Secondary | ICD-10-CM | POA: Insufficient documentation

## 2016-11-21 DIAGNOSIS — Z603 Acculturation difficulty: Secondary | ICD-10-CM

## 2016-11-21 DIAGNOSIS — Z3481 Encounter for supervision of other normal pregnancy, first trimester: Secondary | ICD-10-CM

## 2016-11-21 DIAGNOSIS — Z3A00 Weeks of gestation of pregnancy not specified: Secondary | ICD-10-CM | POA: Insufficient documentation

## 2016-11-21 DIAGNOSIS — Z789 Other specified health status: Secondary | ICD-10-CM

## 2016-11-21 DIAGNOSIS — O219 Vomiting of pregnancy, unspecified: Secondary | ICD-10-CM

## 2016-11-21 HISTORY — DX: Encounter for supervision of other normal pregnancy, unspecified trimester: Z34.80

## 2016-11-21 MED ORDER — PRENATE PIXIE 10-0.6-0.4-200 MG PO CAPS: 1.0000 | ORAL_CAPSULE | Freq: Every day | ORAL | 12 refills | Status: DC

## 2016-11-21 MED ORDER — PROMETHAZINE HCL 12.5 MG PO TABS: 12.5000 mg | ORAL_TABLET | Freq: Four times a day (QID) | ORAL | 4 refills | Status: DC | PRN

## 2016-11-21 NOTE — Progress Notes (Signed)
Subjective:    Peggy Savage is being seen today for her first obstetrical visit.  This is a planned pregnancy. She is at [redacted]w[redacted]d gestation. Her obstetrical history is significant for non-english speaking. Relationship with FOB: spouse, living together. Patient does intend to breast feed. Pregnancy history fully reviewed.  The information documented in the HPI was reviewed and verified.  Menstrual History: OB History    Gravida Para Term Preterm AB Living   2 1 1  0 0 1   SAB TAB Ectopic Multiple Live Births   0 0 0 0 1      No LMP recorded. Patient is pregnant.    Past Medical History:  Diagnosis Date  . Anemia     Past Surgical History:  Procedure Laterality Date  . NO PAST SURGERIES       (Not in a hospital admission) No Known Allergies  Social History  Substance Use Topics  . Smoking status: Never Smoker  . Smokeless tobacco: Never Used  . Alcohol use No    Family History  Problem Relation Age of Onset  . Diabetes Mother   . Hypertension Mother   . Diabetes Father   . Hypertension Father      Review of Systems Constitutional: negative for weight loss Gastrointestinal: negative for vomiting Genitourinary:negative for genital lesions and vaginal discharge and dysuria Musculoskeletal:negative for back pain Behavioral/Psych: negative for abusive relationship, depression, illegal drug usage and tobacco use    Objective:    BP 123/74   Pulse 98   Wt 193 lb 6.4 oz (87.7 kg)   BMI 28.56 kg/m  General Appearance:    Alert, cooperative, no distress, appears stated age  Head:    Normocephalic, without obvious abnormality, atraumatic  Eyes:    PERRL, conjunctiva/corneas clear, EOM's intact, fundi    benign, both eyes  Ears:    Normal TM's and external ear canals, both ears  Nose:   Nares normal, septum midline, mucosa normal, no drainage    or sinus tenderness  Throat:   Lips, mucosa, and tongue normal; teeth and gums normal  Neck:   Supple, symmetrical,  trachea midline, no adenopathy;    thyroid:  no enlargement/tenderness/nodules; no carotid   bruit or JVD  Back:     Symmetric, no curvature, ROM normal, no CVA tenderness  Lungs:     Clear to auscultation bilaterally, respirations unlabored  Chest Wall:    No tenderness or deformity   Heart:    Regular rate and rhythm, S1 and S2 normal, no murmur, rub   or gallop  Breast Exam:    No tenderness, masses, or nipple abnormality  Abdomen:     Soft, non-tender, bowel sounds active all four quadrants,    no masses, no organomegaly  Genitalia:    Normal female without lesion, discharge or tenderness  Extremities:   Extremities normal, atraumatic, no cyanosis or edema  Pulses:   2+ and symmetric all extremities  Skin:   Skin color, texture, turgor normal, no rashes or lesions  Lymph nodes:   Cervical, supraclavicular, and axillary nodes normal  Neurologic:   CNII-XII intact, normal strength, sensation and reflexes    throughout          Cervix:    Long, thick, closed and posterior.  FHR:  164 doppler.  Size c/w dates.    Lab Review Urine pregnancy test Labs reviewed yes Radiologic studies reviewed yes  Assessment & Plan    Pregnancy at [redacted]w[redacted]d weeks  1. Supervision of other normal pregnancy, antepartum   - Cytology - PAP - Cervicovaginal ancillary only - VITAMIN D 25 Hydroxy (Vit-D Deficiency, Fractures) - Hemoglobinopathy evaluation - Varicella zoster antibody, IgG - Culture, OB Urine - MaterniT21 PLUS Core+SCA - Hemoglobin A1c - Obstetric Panel, Including HIV - Cystic Fibrosis Mutation 97 - Prenat-FeAsp-Meth-FA-DHA w/o A (PRENATE PIXIE) 10-0.6-0.4-200 MG CAPS; Take 1 tablet by mouth daily.  Dispense: 30 capsule; Refill: 12  2. Language barrier     Live interpreter present for exam.    3. N&V pregnancy     Phenergan tab sent to pharmacy.   Prenatal vitamins.  Counseling provided regarding continued use of seat belts, cessation of alcohol consumption, smoking or use of  illicit drugs; infection precautions i.e., influenza/TDAP immunizations, toxoplasmosis,CMV, parvovirus, listeria and varicella; workplace safety, exercise during pregnancy; routine dental care, safe medications, sexual activity, hot tubs, saunas, pools, travel, caffeine use, fish and methlymercury, potential toxins, hair treatments, varicose veins Weight gain recommendations per IOM guidelines reviewed: underweight/BMI< 18.5--> gain 28 - 40 lbs; normal weight/BMI 18.5 - 24.9--> gain 25 - 35 lbs; overweight/BMI 25 - 29.9--> gain 15 - 25 lbs; obese/BMI >30->gain  11 - 20 lbs Problem list reviewed and updated. FIRST/CF mutation testing/NIPT/QUAD SCREEN/fragile X/Ashkenazi Jewish population testing/Spinal muscular atrophy discussed: ordered. Role of ultrasound in pregnancy discussed; fetal survey: requested. Amniocentesis discussed: not indicated.  Meds ordered this encounter  Medications  . Prenat-FeAsp-Meth-FA-DHA w/o A (PRENATE PIXIE) 10-0.6-0.4-200 MG CAPS    Sig: Take 1 tablet by mouth daily.    Dispense:  30 capsule    Refill:  12    Please process coupon: Rx BIN: [id number], RxPCN: [id number], RxGRP: [id number], RxID: [id number]  SUF: 01  . promethazine (PHENERGAN) 12.5 MG tablet    Sig: Take 1 tablet (12.5 mg total) by mouth every 6 (six) hours as needed for nausea or vomiting.    Dispense:  30 tablet    Refill:  4   Orders Placed This Encounter  Procedures  . Culture, OB Urine  . VITAMIN D 25 Hydroxy (Vit-D Deficiency, Fractures)  . Hemoglobinopathy evaluation  . Varicella zoster antibody, IgG  . MaterniT21 PLUS Core+SCA    Order Specific Question:   Is the patient insulin dependent?    Answer:   No    Order Specific Question:   Please enter gestational age. This should be expressed as weeks AND days, i.e. 16w 6d. Enter weeks here. Enter days in next question.    Answer:   33    Order Specific Question:   Please enter gestational age. This should be expressed as weeks AND days, i.e. 16w 6d.  Enter days here. Enter weeks in previous question.    Answer:   4    Order Specific Question:   How was gestational age calculated?    Answer:   LMP    Order Specific Question:   Please give the date of LMP OR Ultrasound OR Estimated date of delivery.    Answer:   06/15/2017    Order Specific Question:   Number of Fetuses (Type of Pregnancy):    Answer:   1    Order Specific Question:   Indications for performing the test? (please choose all that apply):    Answer:   Routine screening    Order Specific Question:   Other Indications? (Y=Yes, N=No)    Answer:   N    Order Specific Question:   If this is a repeat specimen, please indicate the  reason:    Answer:   Not indicated    Order Specific Question:   Please specify the patient's race: (C=White/Caucasion, B=Black, I=Native American, A=Asian, H=Hispanic, O=Other, U=Unknown)    Answer:   C    Order Specific Question:   Donor Egg - indicate if the egg was obtained from in vitro fertilization.    Answer:   N    Order Specific Question:   Age of Egg Donor.    Answer:   54    Order Specific Question:   Prior Down Syndrome/ONTD screening during current pregnancy.    Answer:   N    Order Specific Question:   Prior First Trimester Testing    Answer:   N    Order Specific Question:   Prior Second Trimester Testing    Answer:   N    Order Specific Question:   Family History of Neural Tube Defects    Answer:   N    Order Specific Question:   Prior Pregnancy with Down Syndrome    Answer:   N    Order Specific Question:   Please give the patient's weight (in pounds)    Answer:   194  . Hemoglobin A1c  . Obstetric Panel, Including HIV  . Cystic Fibrosis Mutation 97    Follow up in 4 weeks. 50% of 30 min visit spent on counseling and coordination of care.

## 2016-11-21 NOTE — Progress Notes (Signed)
Medication that she is taking for her nausea is not helping.

## 2016-11-22 ENCOUNTER — Other Ambulatory Visit: Payer: Self-pay | Admitting: Certified Nurse Midwife

## 2016-11-22 DIAGNOSIS — Z3480 Encounter for supervision of other normal pregnancy, unspecified trimester: Secondary | ICD-10-CM

## 2016-11-22 LAB — CYTOLOGY - PAP: DIAGNOSIS: NEGATIVE

## 2016-11-22 LAB — CERVICOVAGINAL ANCILLARY ONLY
BACTERIAL VAGINITIS: NEGATIVE
CANDIDA VAGINITIS: NEGATIVE
Chlamydia: NEGATIVE
NEISSERIA GONORRHEA: NEGATIVE
Trichomonas: NEGATIVE

## 2016-11-23 LAB — CULTURE, OB URINE

## 2016-11-23 LAB — URINE CULTURE, OB REFLEX

## 2016-11-24 LAB — OBSTETRIC PANEL, INCLUDING HIV
ANTIBODY SCREEN: NEGATIVE
BASOS ABS: 0 10*3/uL (ref 0.0–0.2)
BASOS: 0 %
EOS (ABSOLUTE): 0 10*3/uL (ref 0.0–0.4)
Eos: 0 %
HIV Screen 4th Generation wRfx: NONREACTIVE
Hematocrit: 35.1 % (ref 34.0–46.6)
Hemoglobin: 11.6 g/dL (ref 11.1–15.9)
Hepatitis B Surface Ag: NEGATIVE
IMMATURE GRANS (ABS): 0 10*3/uL (ref 0.0–0.1)
Immature Granulocytes: 0 %
LYMPHS: 22 %
Lymphocytes Absolute: 2.2 10*3/uL (ref 0.7–3.1)
MCH: 27.2 pg (ref 26.6–33.0)
MCHC: 33 g/dL (ref 31.5–35.7)
MCV: 82 fL (ref 79–97)
MONOS ABS: 0.7 10*3/uL (ref 0.1–0.9)
Monocytes: 7 %
NEUTROS PCT: 71 %
Neutrophils Absolute: 6.8 10*3/uL (ref 1.4–7.0)
PLATELETS: 317 10*3/uL (ref 150–379)
RBC: 4.26 x10E6/uL (ref 3.77–5.28)
RDW: 16.1 % — AB (ref 12.3–15.4)
RPR Ser Ql: NONREACTIVE
Rh Factor: POSITIVE
Rubella Antibodies, IGG: 4.92 index (ref >0.99)
WBC: 9.8 10*3/uL (ref 3.4–10.8)

## 2016-11-24 LAB — VARICELLA ZOSTER ANTIBODY, IGG: VARICELLA: 885 {index} (ref >165)

## 2016-11-24 LAB — HEMOGLOBINOPATHY EVALUATION
HEMOGLOBIN A2 QUANTITATION: 2.4 % (ref 1.8–3.2)
HGB A: 97.6 % (ref 96.4–98.8)
HGB C: 0 %
HGB S: 0 %
HGB VARIANT: 0 %
Hemoglobin F Quantitation: 0 % (ref 0.0–2.0)

## 2016-11-24 LAB — VITAMIN D 25 HYDROXY (VIT D DEFICIENCY, FRACTURES): Vit D, 25-Hydroxy: 19.4 ng/mL — ABNORMAL LOW (ref 30.0–100.0)

## 2016-11-24 LAB — HEMOGLOBIN A1C
ESTIMATED AVERAGE GLUCOSE: 103 mg/dL
HEMOGLOBIN A1C: 5.2 % (ref 4.8–5.6)

## 2016-11-27 LAB — MATERNIT21 PLUS CORE+SCA
Chromosome 13: NEGATIVE
Chromosome 18: NEGATIVE
Chromosome 21: NEGATIVE
Y Chromosome: NOT DETECTED

## 2016-11-29 ENCOUNTER — Other Ambulatory Visit: Payer: Self-pay | Admitting: Certified Nurse Midwife

## 2016-11-29 DIAGNOSIS — Z3480 Encounter for supervision of other normal pregnancy, unspecified trimester: Secondary | ICD-10-CM

## 2016-11-29 DIAGNOSIS — E559 Vitamin D deficiency, unspecified: Secondary | ICD-10-CM | POA: Insufficient documentation

## 2016-11-29 LAB — CYSTIC FIBROSIS MUTATION 97: Interpretation: NOT DETECTED

## 2016-11-29 MED ORDER — VITAMIN D (ERGOCALCIFEROL) 1.25 MG (50000 UNIT) PO CAPS: 50000.0000 [IU] | ORAL_CAPSULE | ORAL | 2 refills | Status: DC

## 2016-11-30 ENCOUNTER — Encounter: Payer: Self-pay | Admitting: *Deleted

## 2016-12-21 ENCOUNTER — Ambulatory Visit (INDEPENDENT_AMBULATORY_CARE_PROVIDER_SITE_OTHER): Payer: Medicaid Other | Admitting: Certified Nurse Midwife

## 2016-12-21 VITALS — BP 101/67 | HR 88 | Wt 193.0 lb

## 2016-12-21 DIAGNOSIS — Z3480 Encounter for supervision of other normal pregnancy, unspecified trimester: Secondary | ICD-10-CM

## 2016-12-21 DIAGNOSIS — E559 Vitamin D deficiency, unspecified: Secondary | ICD-10-CM

## 2016-12-21 DIAGNOSIS — Z3482 Encounter for supervision of other normal pregnancy, second trimester: Secondary | ICD-10-CM

## 2016-12-21 DIAGNOSIS — Z789 Other specified health status: Secondary | ICD-10-CM

## 2016-12-21 NOTE — Progress Notes (Signed)
Video Interpreter # [phone]

## 2016-12-22 ENCOUNTER — Other Ambulatory Visit: Payer: Self-pay | Admitting: Certified Nurse Midwife

## 2016-12-22 NOTE — Progress Notes (Signed)
   PRENATAL VISIT NOTE  Subjective:  Peggy Savage is a 26 y.o. G2P1001 at [redacted]w[redacted]d being seen today for ongoing prenatal care.  She is currently monitored for the following issues for this low-risk pregnancy and has Language barrier; Supervision of other normal pregnancy, antepartum; and Vitamin D deficiency on her problem list.  Patient reports no complaints.  Contractions: Not present. Vag. Bleeding: None.   . Denies leaking of fluid.   The following portions of the patient's history were reviewed and updated as appropriate: allergies, current medications, past family history, past medical history, past social history, past surgical history and problem list. Problem list updated.  Objective:   Vitals:   12/21/16 1634  BP: 101/67  Pulse: 88  Weight: 193 lb (87.5 kg)    Fetal Status: Fetal Heart Rate (bpm): 154; doppler         General:  Alert, oriented and cooperative. Patient is in no acute distress.  Skin: Skin is warm and dry. No rash noted.   Cardiovascular: Normal heart rate noted  Respiratory: Normal respiratory effort, no problems with respiration noted  Abdomen: Soft, gravid, appropriate for gestational age.  Pain/Pressure: Present     Pelvic: Cervical exam deferred        Extremities: Normal range of motion.  Edema: None  Mental Status:  Normal mood and affect. Normal behavior. Normal judgment and thought content.   Assessment and Plan:  Pregnancy: G2P1001 at [redacted]w[redacted]d  1. Supervision of other normal pregnancy, antepartum     Doing well.  - [place] MFM OB COMP + 14 WK; Future  2. Vitamin D deficiency    Taking weekly vitamin D  3. Language barrier     Interpreter used.   Preterm labor symptoms and general obstetric precautions including but not limited to vaginal bleeding, contractions, leaking of fluid and fetal movement were reviewed in detail with the patient. Please refer to After Visit Summary for other counseling recommendations.  Return in about 4 weeks  (around 01/18/2017) for ROB, MSAFP.   Morene Crocker, CNM

## 2017-01-18 ENCOUNTER — Encounter: Payer: Self-pay | Admitting: Certified Nurse Midwife

## 2017-01-18 ENCOUNTER — Ambulatory Visit (INDEPENDENT_AMBULATORY_CARE_PROVIDER_SITE_OTHER): Payer: Medicaid Other | Admitting: Certified Nurse Midwife

## 2017-01-18 VITALS — BP 110/66 | HR 98 | Wt 195.0 lb

## 2017-01-18 DIAGNOSIS — Z3482 Encounter for supervision of other normal pregnancy, second trimester: Secondary | ICD-10-CM

## 2017-01-18 DIAGNOSIS — Z789 Other specified health status: Secondary | ICD-10-CM

## 2017-01-18 DIAGNOSIS — E559 Vitamin D deficiency, unspecified: Secondary | ICD-10-CM

## 2017-01-18 DIAGNOSIS — Z3480 Encounter for supervision of other normal pregnancy, unspecified trimester: Secondary | ICD-10-CM

## 2017-01-18 MED ORDER — VITAMIN D (ERGOCALCIFEROL) 1.25 MG (50000 UNIT) PO CAPS: 50000.0000 [IU] | ORAL_CAPSULE | ORAL | 2 refills | Status: DC

## 2017-01-18 NOTE — Progress Notes (Signed)
Pt states that she is feeling very weak, lightheaded. Pt states she is not drinking well. Pt did not get Vitamin D Rx, does she still need take? Send new order?

## 2017-01-18 NOTE — Progress Notes (Signed)
   PRENATAL VISIT NOTE  Subjective:  Grayson White is a 26 y.o. G2P1001 at [redacted]w[redacted]d being seen today for ongoing prenatal care.  She is currently monitored for the following issues for this low-risk pregnancy and has Language barrier; Supervision of other normal pregnancy, antepartum; and Vitamin D deficiency on her problem list.  Patient reports no bleeding, no contractions, no cramping, no leaking and vertigo occasionally, not drinking much water increased water intake encouraged.  Contractions: Not present. Vag. Bleeding: None.  Movement: Present. Denies leaking of fluid.   The following portions of the patient's history were reviewed and updated as appropriate: allergies, current medications, past family history, past medical history, past social history, past surgical history and problem list. Problem list updated.  Objective:   Vitals:   01/18/17 1624  BP: 110/66  Pulse: 98  Weight: 195 lb (88.5 kg)    Fetal Status: Fetal Heart Rate (bpm): 150; doppler Fundal Height: 21 cm Movement: Present     General:  Alert, oriented and cooperative. Patient is in no acute distress.  Skin: Skin is warm and dry. No rash noted.   Cardiovascular: Normal heart rate noted  Respiratory: Normal respiratory effort, no problems with respiration noted  Abdomen: Soft, gravid, appropriate for gestational age.  Pain/Pressure: Absent     Pelvic: Cervical exam deferred        Extremities: Normal range of motion.  Edema: None  Mental Status:  Normal mood and affect. Normal behavior. Normal judgment and thought content.   Assessment and Plan:  Pregnancy: G2P1001 at [redacted]w[redacted]d  1. Supervision of other normal pregnancy, antepartum     Doing well.  Has [place] scheduled for 01/23/17. - AFP, Serum, Open Spina Bifida  2. Language barrier     Arabic video interpreter used.   3. Vitamin D deficiency     Reorder.  - Vitamin D, Ergocalciferol, (DRISDOL) 50000 units CAPS capsule; Take 1 capsule (50,000 Units total)  by mouth every 7 (seven) days.  Dispense: 30 capsule; Refill: 2  Preterm labor symptoms and general obstetric precautions including but not limited to vaginal bleeding, contractions, leaking of fluid and fetal movement were reviewed in detail with the patient. Please refer to After Visit Summary for other counseling recommendations.  Return in about 4 weeks (around 02/15/2017) for ROB.   Morene Crocker, CNM

## 2017-01-21 LAB — AFP, SERUM, OPEN SPINA BIFIDA
AFP MOM: 1.33
AFP Value: 52.3 ng/mL
GEST. AGE ON COLLECTION DATE: 18.6 wk
Maternal Age At EDD: 28.3 yr
OSBR RISK 1 IN: 4399
TEST RESULTS AFP: NEGATIVE
WEIGHT: 195 [lb_av]

## 2017-01-23 ENCOUNTER — Other Ambulatory Visit: Payer: Self-pay | Admitting: Certified Nurse Midwife

## 2017-01-23 ENCOUNTER — Ambulatory Visit (HOSPITAL_COMMUNITY)
Admission: RE | Admit: 2017-01-23 | Discharge: 2017-01-23 | Disposition: A | Discharge status: Home / Self Care | Payer: Medicaid Other | Source: Ambulatory Visit | Attending: Certified Nurse Midwife | Admitting: Certified Nurse Midwife

## 2017-01-23 DIAGNOSIS — Z3480 Encounter for supervision of other normal pregnancy, unspecified trimester: Secondary | ICD-10-CM

## 2017-01-23 DIAGNOSIS — Z3A19 19 weeks gestation of pregnancy: Secondary | ICD-10-CM | POA: Diagnosis not present

## 2017-01-23 DIAGNOSIS — Z3689 Encounter for other specified antenatal screening: Secondary | ICD-10-CM | POA: Insufficient documentation

## 2017-01-24 ENCOUNTER — Other Ambulatory Visit: Payer: Self-pay | Admitting: Certified Nurse Midwife

## 2017-01-24 DIAGNOSIS — Z3480 Encounter for supervision of other normal pregnancy, unspecified trimester: Secondary | ICD-10-CM

## 2017-02-15 ENCOUNTER — Ambulatory Visit (INDEPENDENT_AMBULATORY_CARE_PROVIDER_SITE_OTHER): Payer: Medicaid Other | Admitting: Certified Nurse Midwife

## 2017-02-15 VITALS — BP 110/66 | HR 88 | Wt 195.6 lb

## 2017-02-15 DIAGNOSIS — Z789 Other specified health status: Secondary | ICD-10-CM

## 2017-02-15 DIAGNOSIS — Z3482 Encounter for supervision of other normal pregnancy, second trimester: Secondary | ICD-10-CM

## 2017-02-15 DIAGNOSIS — Z3480 Encounter for supervision of other normal pregnancy, unspecified trimester: Secondary | ICD-10-CM

## 2017-02-15 DIAGNOSIS — E559 Vitamin D deficiency, unspecified: Secondary | ICD-10-CM

## 2017-02-15 NOTE — Progress Notes (Signed)
   PRENATAL VISIT NOTE  Subjective:  Peggy Savage is a 26 y.o. G2P1001 at [redacted]w[redacted]d being seen today for ongoing prenatal care.  She is currently monitored for the following issues for this low-risk pregnancy and has Language barrier; Supervision of other normal pregnancy, antepartum; and Vitamin D deficiency on her problem list.  Patient reports no bleeding, no contractions, no cramping, no leaking and shin splints.  Contractions: Not present. Vag. Bleeding: None.  Movement: Present. Denies leaking of fluid.   The following portions of the patient's history were reviewed and updated as appropriate: allergies, current medications, past family history, past medical history, past social history, past surgical history and problem list. Problem list updated.  Objective:   Vitals:   02/15/17 1621  BP: 110/66  Pulse: 88  Weight: 195 lb 9.6 oz (88.7 kg)    Fetal Status: Fetal Heart Rate (bpm): 156; doppler Fundal Height: 23 cm Movement: Present     General:  Alert, oriented and cooperative. Patient is in no acute distress.  Skin: Skin is warm and dry. No rash noted.   Cardiovascular: Normal heart rate noted  Respiratory: Normal respiratory effort, no problems with respiration noted  Abdomen: Soft, gravid, appropriate for gestational age.  Pain/Pressure: Absent     Pelvic: Cervical exam deferred        Extremities: Normal range of motion.  Edema: Trace  Mental Status:  Normal mood and affect. Normal behavior. Normal judgment and thought content.   Assessment and Plan:  Pregnancy: G2P1001 at [redacted]w[redacted]d  1. Supervision of other normal pregnancy, antepartum     Normal pregnancy discomforts  2. Language barrier      [place] video interpreter used  3. Vitamin D deficiency      Taking weekly vitamin D  Preterm labor symptoms and general obstetric precautions including but not limited to vaginal bleeding, contractions, leaking of fluid and fetal movement were reviewed in detail with the  patient. Please refer to After Visit Summary for other counseling recommendations.  Return in about 4 weeks (around 03/15/2017) for ROB, 2 hr OGTT.   Morene Crocker, CNM

## 2017-02-15 NOTE — Progress Notes (Signed)
Arabic Interpreter: Aseel [phone]. Complains of leg cramps, muscle spasm in legs at night. Pain in the mornings. Have numbness in hands whenever she sleeps on her side.

## 2017-02-24 ENCOUNTER — Other Ambulatory Visit: Payer: Self-pay | Admitting: Certified Nurse Midwife

## 2017-02-24 ENCOUNTER — Ambulatory Visit (HOSPITAL_COMMUNITY)
Admission: RE | Admit: 2017-02-24 | Discharge: 2017-02-24 | Disposition: A | Discharge status: Home / Self Care | Payer: Medicaid Other | Source: Ambulatory Visit | Attending: Certified Nurse Midwife | Admitting: Certified Nurse Midwife

## 2017-02-24 DIAGNOSIS — Z3480 Encounter for supervision of other normal pregnancy, unspecified trimester: Secondary | ICD-10-CM

## 2017-02-24 DIAGNOSIS — Z3482 Encounter for supervision of other normal pregnancy, second trimester: Secondary | ICD-10-CM | POA: Diagnosis present

## 2017-02-24 DIAGNOSIS — Z3A24 24 weeks gestation of pregnancy: Secondary | ICD-10-CM

## 2017-02-24 DIAGNOSIS — Z362 Encounter for other antenatal screening follow-up: Secondary | ICD-10-CM | POA: Diagnosis present

## 2017-02-24 DIAGNOSIS — O09892 Supervision of other high risk pregnancies, second trimester: Secondary | ICD-10-CM | POA: Insufficient documentation

## 2017-02-24 DIAGNOSIS — O321XX0 Maternal care for breech presentation, not applicable or unspecified: Secondary | ICD-10-CM | POA: Insufficient documentation

## 2017-02-27 ENCOUNTER — Other Ambulatory Visit: Payer: Self-pay | Admitting: Certified Nurse Midwife

## 2017-02-27 DIAGNOSIS — Z3480 Encounter for supervision of other normal pregnancy, unspecified trimester: Secondary | ICD-10-CM

## 2017-03-13 ENCOUNTER — Ambulatory Visit (INDEPENDENT_AMBULATORY_CARE_PROVIDER_SITE_OTHER): Payer: Medicaid Other | Admitting: Certified Nurse Midwife

## 2017-03-13 ENCOUNTER — Other Ambulatory Visit: Payer: Medicaid Other

## 2017-03-13 ENCOUNTER — Encounter: Payer: Self-pay | Admitting: Certified Nurse Midwife

## 2017-03-13 VITALS — BP 107/67 | HR 102 | Wt 202.2 lb

## 2017-03-13 DIAGNOSIS — Z3480 Encounter for supervision of other normal pregnancy, unspecified trimester: Secondary | ICD-10-CM

## 2017-03-13 DIAGNOSIS — E559 Vitamin D deficiency, unspecified: Secondary | ICD-10-CM

## 2017-03-13 NOTE — Progress Notes (Signed)
   PRENATAL VISIT NOTE  Subjective:  Peggy Savage is a 26 y.o. G2P1001 at [redacted]w[redacted]d being seen today for ongoing prenatal care.  She is currently monitored for the following issues for this low-risk pregnancy and has Language barrier; Supervision of other normal pregnancy, antepartum; and Vitamin D deficiency on their problem list.  Patient reports no complaints.  Contractions: Not present. Vag. Bleeding: None.  Movement: Present. Denies leaking of fluid.   The following portions of the patient's history were reviewed and updated as appropriate: allergies, current medications, past family history, past medical history, past social history, past surgical history and problem list. Problem list updated.  Objective:   Vitals:   03/13/17 0909  BP: 107/67  Pulse: (!) 102  Weight: 202 lb 3.2 oz (91.7 kg)    Fetal Status: Fetal Heart Rate (bpm): 148; doppler Fundal Height: 27 cm Movement: Present     General:  Alert, oriented and cooperative. Patient is in no acute distress.  Skin: Skin is warm and dry. No rash noted.   Cardiovascular: Normal heart rate noted  Respiratory: Normal respiratory effort, no problems with respiration noted  Abdomen: Soft, gravid, appropriate for gestational age.  Pain/Pressure: Absent     Pelvic: Cervical exam deferred        Extremities: Normal range of motion.  Edema: None  Mental Status:  Normal mood and affect. Normal behavior. Normal judgment and thought content.   Assessment and Plan:  Pregnancy: G2P1001 at [redacted]w[redacted]d  1. Vitamin D deficiency     Taking weekly vitamin D  2. Supervision of other normal pregnancy, antepartum      Doing well. 2 hour OGTT.  Normal discomforts of pregnancy discussed: muscle cramping; increased potassium intake and yoga stretching exercises encouraged.   Preterm labor symptoms and general obstetric precautions including but not limited to vaginal bleeding, contractions, leaking of fluid and fetal movement were reviewed in  detail with the patient. Please refer to After Visit Summary for other counseling recommendations.  Return in about 2 weeks (around 03/27/2017) for ROB.   Morene Crocker, CNM

## 2017-03-13 NOTE — Progress Notes (Signed)
Pt c/o bilateral leg cramping, continuous. She reports stretching helps pain sometimes.

## 2017-03-14 LAB — CBC
Hematocrit: 30.5 % — ABNORMAL LOW (ref 34.0–46.6)
Hemoglobin: 9.5 g/dL — ABNORMAL LOW (ref 11.1–15.9)
MCH: 25.3 pg — AB (ref 26.6–33.0)
MCHC: 31.1 g/dL — ABNORMAL LOW (ref 31.5–35.7)
MCV: 81 fL (ref 79–97)
PLATELETS: 248 10*3/uL (ref 150–379)
RBC: 3.75 x10E6/uL — ABNORMAL LOW (ref 3.77–5.28)
RDW: 15.4 % (ref 12.3–15.4)
WBC: 9.1 10*3/uL (ref 3.4–10.8)

## 2017-03-14 LAB — GLUCOSE TOLERANCE, 2 HOURS W/ 1HR
GLUCOSE, 1 HOUR: 156 mg/dL (ref 65–179)
Glucose, 2 hour: 112 mg/dL (ref 65–152)
Glucose, Fasting: 84 mg/dL (ref 65–91)

## 2017-03-14 LAB — RPR: RPR Ser Ql: NONREACTIVE

## 2017-03-14 LAB — HIV ANTIBODY (ROUTINE TESTING W REFLEX): HIV Screen 4th Generation wRfx: NONREACTIVE

## 2017-03-16 ENCOUNTER — Other Ambulatory Visit: Payer: Self-pay | Admitting: Certified Nurse Midwife

## 2017-03-16 DIAGNOSIS — Z3480 Encounter for supervision of other normal pregnancy, unspecified trimester: Secondary | ICD-10-CM

## 2017-03-16 DIAGNOSIS — O99019 Anemia complicating pregnancy, unspecified trimester: Secondary | ICD-10-CM | POA: Insufficient documentation

## 2017-03-16 DIAGNOSIS — O99013 Anemia complicating pregnancy, third trimester: Secondary | ICD-10-CM

## 2017-03-16 MED ORDER — CITRANATAL BLOOM 90-1 MG PO TABS: 1.0000 | ORAL_TABLET | Freq: Every day | ORAL | 12 refills | Status: DC

## 2017-03-17 ENCOUNTER — Telehealth: Payer: Self-pay

## 2017-03-17 NOTE — Telephone Encounter (Signed)
-----   Message from Morene Crocker, CNM sent at 03/16/2017  1:49 PM EST ----- Please let her know that she is anemic.  I have added bloom to her other PNV.  Thank you. R.Denney CNM

## 2017-03-17 NOTE — Telephone Encounter (Signed)
Patient notified of results and Rx. 

## 2017-03-27 ENCOUNTER — Encounter: Payer: Medicaid Other | Admitting: Certified Nurse Midwife

## 2017-04-03 ENCOUNTER — Encounter: Payer: Medicaid Other | Admitting: Certified Nurse Midwife

## 2017-04-06 ENCOUNTER — Ambulatory Visit (INDEPENDENT_AMBULATORY_CARE_PROVIDER_SITE_OTHER): Payer: Medicaid Other | Admitting: Obstetrics

## 2017-04-06 ENCOUNTER — Other Ambulatory Visit: Payer: Self-pay

## 2017-04-06 ENCOUNTER — Encounter: Payer: Self-pay | Admitting: Obstetrics

## 2017-04-06 VITALS — BP 121/67 | HR 103 | Wt 210.5 lb

## 2017-04-06 DIAGNOSIS — Z789 Other specified health status: Secondary | ICD-10-CM

## 2017-04-06 DIAGNOSIS — M545 Low back pain, unspecified: Secondary | ICD-10-CM

## 2017-04-06 DIAGNOSIS — Z3483 Encounter for supervision of other normal pregnancy, third trimester: Secondary | ICD-10-CM

## 2017-04-06 DIAGNOSIS — Z3480 Encounter for supervision of other normal pregnancy, unspecified trimester: Secondary | ICD-10-CM

## 2017-04-06 MED ORDER — COMFORT FIT MATERNITY SUPP SM MISC: 0 refills | Status: DC

## 2017-04-06 NOTE — Progress Notes (Signed)
Subjective:  Peggy Savage is a 26 y.o. G2P1001 at [redacted]w[redacted]d being seen today for ongoing prenatal care.  She is currently monitored for the following issues for this low-risk pregnancy and has Language barrier; Supervision of other normal pregnancy, antepartum; Vitamin D deficiency; and Anemia in pregnancy on their problem list.  Patient reports backache.  Contractions: Not present. Vag. Bleeding: None.  Movement: Present. Denies leaking of fluid.   The following portions of the patient's history were reviewed and updated as appropriate: allergies, current medications, past family history, past medical history, past social history, past surgical history and problem list. Problem list updated.  Objective:   Vitals:   04/06/17 1624  BP: 121/67  Pulse: (!) 103  Weight: 210 lb 8 oz (95.5 kg)    Fetal Status:     Movement: Present     General:  Alert, oriented and cooperative. Patient is in no acute distress.  Skin: Skin is warm and dry. No rash noted.   Cardiovascular: Normal heart rate noted  Respiratory: Normal respiratory effort, no problems with respiration noted  Abdomen: Soft, gravid, appropriate for gestational age. Pain/Pressure: Present     Pelvic:  Cervical exam deferred        Extremities: Normal range of motion.  Edema: Trace  Mental Status: Normal mood and affect. Normal behavior. Normal judgment and thought content.   Urinalysis:      Assessment and Plan:  Pregnancy: G2P1001 at [redacted]w[redacted]d  1. Supervision of other normal pregnancy, antepartum   2. Language barrier   3. Acute midline low back pain without sciatica Rx: - Elastic Bandages & Supports (COMFORT FIT MATERNITY SUPP SM) MISC; Wear as directed.  Dispense: 1 each; Refill: 0  Preterm labor symptoms and general obstetric precautions including but not limited to vaginal bleeding, contractions, leaking of fluid and fetal movement were reviewed in detail with the patient. Please refer to After Visit Summary for  other counseling recommendations.  Return in about 2 weeks (around 04/20/2017) for ROB.   Shelly Bombard, MD

## 2017-04-18 NOTE — L&D Delivery Note (Signed)
Patient is 27 y.o. G2P1001 [redacted]w[redacted]d admitted with SOL. AROM at 1358.  Prenatal course also complicated by anemia.  Delivery Note At 3:55 PM a viable female was delivered via Vaginal, Spontaneous.  Presentation: cephalic, ROA.  After delivery of fetal head patient had 37m10s shoulder dystocia.   Delivery of the head: 06/19/2017  3:53 PM First maneuver: 06/19/2017  3:53 PM, McRoberts Second maneuver: 06/19/2017  3:53 PM, Suprapubic Pressure Third maneuver: 06/19/2017  3:54 PM, Rotation of the posterior arm    APGAR: 8, 9; weight pending.   Placenta status: Intact.   Cord: 3V with the following complications: None.  Cord pH: None  Anesthesia: Epidural   Episiotomy: None  Lacerations:  None Est. Blood Loss (mL):  350  Mom to postpartum.  Baby to Couplet care / Skin to Skin.  Luiz Blare, DO 06/19/2017, 4:17 PM

## 2017-04-25 ENCOUNTER — Ambulatory Visit (INDEPENDENT_AMBULATORY_CARE_PROVIDER_SITE_OTHER): Payer: Medicaid Other | Admitting: Certified Nurse Midwife

## 2017-04-25 VITALS — BP 121/73 | HR 109 | Wt 209.8 lb

## 2017-04-25 DIAGNOSIS — E559 Vitamin D deficiency, unspecified: Secondary | ICD-10-CM

## 2017-04-25 DIAGNOSIS — K219 Gastro-esophageal reflux disease without esophagitis: Secondary | ICD-10-CM

## 2017-04-25 DIAGNOSIS — Z3480 Encounter for supervision of other normal pregnancy, unspecified trimester: Secondary | ICD-10-CM

## 2017-04-25 DIAGNOSIS — O99013 Anemia complicating pregnancy, third trimester: Secondary | ICD-10-CM

## 2017-04-25 DIAGNOSIS — O99613 Diseases of the digestive system complicating pregnancy, third trimester: Secondary | ICD-10-CM

## 2017-04-25 DIAGNOSIS — Z789 Other specified health status: Secondary | ICD-10-CM

## 2017-04-25 MED ORDER — OMEPRAZOLE 20 MG PO CPDR: 20.0000 mg | DELAYED_RELEASE_CAPSULE | Freq: Two times a day (BID) | ORAL | 5 refills | Status: DC

## 2017-04-25 NOTE — Progress Notes (Signed)
Patient reports good fetal movement, complains of chest pains since yesterday.

## 2017-04-25 NOTE — Progress Notes (Signed)
   PRENATAL VISIT NOTE  Subjective:  Peggy Savage is a 27 y.o. G2P1001 at [redacted]w[redacted]d being seen today for ongoing prenatal care.  She is currently monitored for the following issues for this low-risk pregnancy and has Language barrier; Supervision of other normal pregnancy, antepartum; Vitamin D deficiency; and Anemia in pregnancy on their problem list.  Patient reports heartburn, no bleeding, no contractions, no cramping, no leaking and anxiety over loss of her mother, discussed journaling and relaxation exercizes, encouraged if chest pain does not go away with reflux medication to go to [facility] ED.  Contractions: Not present. Vag. Bleeding: None.  Movement: Present. Denies leaking of fluid.   The following portions of the patient's history were reviewed and updated as appropriate: allergies, current medications, past family history, past medical history, past social history, past surgical history and problem list. Problem list updated.  Objective:   Vitals:   04/25/17 1617  BP: 121/73  Pulse: (!) 109  Weight: 209 lb 12.8 oz (95.2 kg)    Fetal Status: Fetal Heart Rate (bpm): 150; doppler Fundal Height: 33 cm Movement: Present     General:  Alert, oriented and cooperative. Patient is in no acute distress.  Skin: Skin is warm and dry. No rash noted.   Cardiovascular: Normal heart rate noted  Respiratory: Normal respiratory effort, no problems with respiration noted  Abdomen: Soft, gravid, appropriate for gestational age.  Pain/Pressure: Absent     Pelvic: Cervical exam deferred        Extremities: Normal range of motion.  Edema: None  Mental Status:  Normal mood and affect. Normal behavior. Normal judgment and thought content.   Assessment and Plan:  Pregnancy: G2P1001 at [redacted]w[redacted]d  1. Supervision of other normal pregnancy, antepartum     Anxiety over mothering difficulties and loss of her mother several months ago.  Consider Vistaril for anxiety if other methods do not work with  her next [place].  - Tdap vaccine greater than or equal to 7yo IM: declined  2. Vitamin D deficiency     Taking weekly vitamin D  3. Language barrier    Video interpreter used  4. Anemia during pregnancy in third trimester     Taking Bloom - CBC  5. Gastroesophageal reflux during pregnancy, antepartum, third trimester      OTC TUMS - omeprazole (PRILOSEC) 20 MG capsule; Take 1 capsule (20 mg total) by mouth 2 (two) times daily before a meal.  Dispense: 60 capsule; Refill: 5  Preterm labor symptoms and general obstetric precautions including but not limited to vaginal bleeding, contractions, leaking of fluid and fetal movement were reviewed in detail with the patient. Please refer to After Visit Summary for other counseling recommendations.  Return in about 2 weeks (around 05/09/2017) for ROB.   Morene Crocker, CNM

## 2017-04-26 ENCOUNTER — Encounter: Payer: Self-pay | Admitting: *Deleted

## 2017-04-26 LAB — CBC
Hematocrit: 34.2 % (ref 34.0–46.6)
Hemoglobin: 10.8 g/dL — ABNORMAL LOW (ref 11.1–15.9)
MCH: 26.5 pg — ABNORMAL LOW (ref 26.6–33.0)
MCHC: 31.6 g/dL (ref 31.5–35.7)
MCV: 84 fL (ref 79–97)
PLATELETS: 206 10*3/uL (ref 150–379)
RBC: 4.08 x10E6/uL (ref 3.77–5.28)
RDW: 21.8 % — ABNORMAL HIGH (ref 12.3–15.4)
WBC: 10.7 10*3/uL (ref 3.4–10.8)

## 2017-05-11 ENCOUNTER — Other Ambulatory Visit (HOSPITAL_COMMUNITY)
Admission: RE | Admit: 2017-05-11 | Discharge: 2017-05-11 | Disposition: A | Discharge status: Home / Self Care | Payer: Medicaid Other | Source: Ambulatory Visit | Attending: Certified Nurse Midwife | Admitting: Certified Nurse Midwife

## 2017-05-11 ENCOUNTER — Ambulatory Visit (INDEPENDENT_AMBULATORY_CARE_PROVIDER_SITE_OTHER): Payer: Medicaid Other | Admitting: Certified Nurse Midwife

## 2017-05-11 VITALS — BP 116/72 | HR 103 | Wt 216.6 lb

## 2017-05-11 DIAGNOSIS — Z3480 Encounter for supervision of other normal pregnancy, unspecified trimester: Secondary | ICD-10-CM | POA: Insufficient documentation

## 2017-05-11 DIAGNOSIS — Z3483 Encounter for supervision of other normal pregnancy, third trimester: Secondary | ICD-10-CM

## 2017-05-11 DIAGNOSIS — E559 Vitamin D deficiency, unspecified: Secondary | ICD-10-CM

## 2017-05-11 DIAGNOSIS — Z789 Other specified health status: Secondary | ICD-10-CM

## 2017-05-11 NOTE — Progress Notes (Signed)
Patient reports good fetal movement with some pressure, denies contractions. Pt reports stuffy nose, cough, runny eyes, and pain in ears for the last 3 days.

## 2017-05-11 NOTE — Progress Notes (Signed)
   PRENATAL VISIT NOTE  Subjective:  Peggy Savage is a 27 y.o. G2P1001 at [redacted]w[redacted]d being seen today for ongoing prenatal care.  She is currently monitored for the following issues for this low-risk pregnancy and has Language barrier; Supervision of other normal pregnancy, antepartum; Vitamin D deficiency; and Anemia in pregnancy on their problem list.  Patient reports no bleeding, no leaking, occasional contractions and URI symptoms; no fever.  OTC medications discussed.  Contractions: Not present. Vag. Bleeding: None.  Movement: Present. Denies leaking of fluid.   The following portions of the patient's history were reviewed and updated as appropriate: allergies, current medications, past family history, past medical history, past social history, past surgical history and problem list. Problem list updated.  Objective:   Vitals:   05/11/17 1615  BP: 116/72  Pulse: (!) 103  Weight: 216 lb 9.6 oz (98.2 kg)    Fetal Status: Fetal Heart Rate (bpm): 127-145; doppler Fundal Height: 35 cm Movement: Present  Presentation: Vertex  General:  Alert, oriented and cooperative. Patient is in no acute distress.  Skin: Skin is warm and dry. No rash noted.   Cardiovascular: Normal heart rate noted  Respiratory: Normal respiratory effort, no problems with respiration noted  Abdomen: Soft, gravid, appropriate for gestational age.  Pain/Pressure: Present     Pelvic: Cervical exam performed Dilation: Closed Effacement (%): 20 Station: Ballotable  Extremities: Normal range of motion.  Edema: Trace  Mental Status:  Normal mood and affect. Normal behavior. Normal judgment and thought content.   Assessment and Plan:  Pregnancy: G2P1001 at [redacted]w[redacted]d  1. Supervision of other normal pregnancy, antepartum     Doing well - Strep Gp B NAA - Cervicovaginal ancillary only  2. Vitamin D deficiency     Taking weekly vitamin D  3. Language barrier     Video interpreter used.   Preterm labor symptoms and  general obstetric precautions including but not limited to vaginal bleeding, contractions, leaking of fluid and fetal movement were reviewed in detail with the patient. Please refer to After Visit Summary for other counseling recommendations.  Return in about 1 week (around 05/18/2017) for [place].   Morene Crocker, CNM

## 2017-05-13 LAB — STREP GP B NAA: Strep Gp B NAA: NEGATIVE

## 2017-05-15 ENCOUNTER — Other Ambulatory Visit: Payer: Self-pay | Admitting: Certified Nurse Midwife

## 2017-05-15 DIAGNOSIS — Z3480 Encounter for supervision of other normal pregnancy, unspecified trimester: Secondary | ICD-10-CM

## 2017-05-15 LAB — CERVICOVAGINAL ANCILLARY ONLY
BACTERIAL VAGINITIS: NEGATIVE
CHLAMYDIA, DNA PROBE: NEGATIVE
Candida vaginitis: NEGATIVE
Neisseria Gonorrhea: NEGATIVE
Trichomonas: NEGATIVE

## 2017-05-18 ENCOUNTER — Ambulatory Visit (INDEPENDENT_AMBULATORY_CARE_PROVIDER_SITE_OTHER): Payer: Medicaid Other | Admitting: Certified Nurse Midwife

## 2017-05-18 ENCOUNTER — Encounter: Payer: Self-pay | Admitting: Certified Nurse Midwife

## 2017-05-18 DIAGNOSIS — Z3480 Encounter for supervision of other normal pregnancy, unspecified trimester: Secondary | ICD-10-CM

## 2017-05-18 NOTE — Progress Notes (Signed)
   PRENATAL VISIT NOTE  Subjective:  Kanda Deluna is a 27 y.o. G2P1001 at 24w0dbeing seen today for ongoing prenatal care.  She is currently monitored for the following issues for this low-risk pregnancy and has Language barrier; Supervision of other normal pregnancy, antepartum; Vitamin D deficiency; and Anemia in pregnancy on their problem list.  Patient reports no bleeding, no contractions, no cramping, no leaking and leg cramps discussed, has increased her potassium intake and streched.  Contractions: Not present. Vag. Bleeding: None.  Movement: Present. Denies leaking of fluid.   The following portions of the patient's history were reviewed and updated as appropriate: allergies, current medications, past family history, past medical history, past social history, past surgical history and problem list. Problem list updated.  Objective:   Vitals:   05/18/17 1618  BP: 118/75  Pulse: 94  Weight: 215 lb 9.6 oz (97.8 kg)    Fetal Status: Fetal Heart Rate (bpm): 147; doppler Fundal Height: 36 cm Movement: Present     General:  Alert, oriented and cooperative. Patient is in no acute distress.  Skin: Skin is warm and dry. No rash noted.   Cardiovascular: Normal heart rate noted  Respiratory: Normal respiratory effort, no problems with respiration noted  Abdomen: Soft, gravid, appropriate for gestational age.  Pain/Pressure: Absent     Pelvic: Cervical exam deferred        Extremities: Normal range of motion.  Edema: Trace  Mental Status:  Normal mood and affect. Normal behavior. Normal judgment and thought content.   Assessment and Plan:  Pregnancy: G2P1001 at 384w0d1. Supervision of other normal pregnancy, antepartum     Normal discomforts of pregnancy - Comp Met (CMET)  Preterm labor symptoms and general obstetric precautions including but not limited to vaginal bleeding, contractions, leaking of fluid and fetal movement were reviewed in detail with the patient. Please  refer to After Visit Summary for other counseling recommendations.  Return in about 1 week (around 05/25/2017) for ROB.   RaMorene CrockerCNM

## 2017-05-18 NOTE — Progress Notes (Signed)
Patient reports good fetal movement, denies pain. 

## 2017-05-19 LAB — COMPREHENSIVE METABOLIC PANEL
A/G RATIO: 1.2 (ref 1.2–2.2)
ALBUMIN: 3.6 g/dL (ref 3.5–5.5)
ALT: 22 IU/L (ref 0–32)
AST: 25 IU/L (ref 0–40)
Alkaline Phosphatase: 123 IU/L — ABNORMAL HIGH (ref 39–117)
BILIRUBIN TOTAL: 0.2 mg/dL (ref 0.0–1.2)
BUN/Creatinine Ratio: 15 (ref 9–23)
BUN: 7 mg/dL (ref 6–20)
CHLORIDE: 104 mmol/L (ref 96–106)
CO2: 20 mmol/L (ref 20–29)
Calcium: 9.1 mg/dL (ref 8.7–10.2)
Creatinine, Ser: 0.47 mg/dL — ABNORMAL LOW (ref 0.57–1.00)
GFR calc non Af Amer: 135 mL/min/{1.73_m2} (ref >59)
GFR, EST AFRICAN AMERICAN: 155 mL/min/{1.73_m2} (ref >59)
Globulin, Total: 2.9 g/dL (ref 1.5–4.5)
Glucose: 78 mg/dL (ref 65–99)
POTASSIUM: 4.1 mmol/L (ref 3.5–5.2)
SODIUM: 137 mmol/L (ref 134–144)
TOTAL PROTEIN: 6.5 g/dL (ref 6.0–8.5)

## 2017-05-25 ENCOUNTER — Ambulatory Visit (INDEPENDENT_AMBULATORY_CARE_PROVIDER_SITE_OTHER): Payer: Medicaid Other | Admitting: Obstetrics & Gynecology

## 2017-05-25 VITALS — BP 114/71 | HR 92 | Wt 218.5 lb

## 2017-05-25 DIAGNOSIS — Z3480 Encounter for supervision of other normal pregnancy, unspecified trimester: Secondary | ICD-10-CM

## 2017-05-25 NOTE — Progress Notes (Signed)
   PRENATAL VISIT NOTE  Subjective:  Peggy Savage is a 27 y.o. G2P1001 at [redacted]w[redacted]d being seen today for ongoing prenatal care.  She is currently monitored for the following issues for this low-risk pregnancy and has Language barrier; Supervision of other normal pregnancy, antepartum; Vitamin D deficiency; and Anemia in pregnancy on their problem list.  Patient reports no complaints.  Contractions: Not present. Vag. Bleeding: None.  Movement: Present. Denies leaking of fluid.   The following portions of the patient's history were reviewed and updated as appropriate: allergies, current medications, past family history, past medical history, past social history, past surgical history and problem list. Problem list updated.  Objective:   Vitals:   05/25/17 1631  BP: 114/71  Pulse: 92  Weight: 218 lb 8 oz (99.1 kg)    Fetal Status: Fetal Heart Rate (bpm): 155 Fundal Height: 37 cm Movement: Present     General:  Alert, oriented and cooperative. Patient is in no acute distress.  Skin: Skin is warm and dry. No rash noted.   Cardiovascular: Normal heart rate noted  Respiratory: Normal respiratory effort, no problems with respiration noted  Abdomen: Soft, gravid, appropriate for gestational age.  Pain/Pressure: Absent     Pelvic: Cervical exam deferred        Extremities: Normal range of motion.  Edema: None  Mental Status:  Normal mood and affect. Normal behavior. Normal judgment and thought content.   Assessment and Plan:  Pregnancy: G2P1001 at [redacted]w[redacted]d  1. Supervision of other normal pregnancy, antepartum Term labor symptoms and general obstetric precautions including but not limited to vaginal bleeding, contractions, leaking of fluid and fetal movement were reviewed in detail with the patient. Please refer to After Visit Summary for other counseling recommendations.  Return in about 1 week (around 06/01/2017) for OB Visit.   Verita Schneiders, MD

## 2017-05-25 NOTE — Patient Instructions (Signed)
Return to clinic for any scheduled appointments or obstetric concerns, or go to [facility] for evaluation  

## 2017-06-01 ENCOUNTER — Encounter: Payer: Self-pay | Admitting: Obstetrics and Gynecology

## 2017-06-01 ENCOUNTER — Ambulatory Visit (INDEPENDENT_AMBULATORY_CARE_PROVIDER_SITE_OTHER): Payer: Medicaid Other | Admitting: Obstetrics and Gynecology

## 2017-06-01 VITALS — BP 120/69 | HR 100 | Wt 217.1 lb

## 2017-06-01 DIAGNOSIS — Z3480 Encounter for supervision of other normal pregnancy, unspecified trimester: Secondary | ICD-10-CM

## 2017-06-01 DIAGNOSIS — Z789 Other specified health status: Secondary | ICD-10-CM

## 2017-06-01 DIAGNOSIS — Z3483 Encounter for supervision of other normal pregnancy, third trimester: Secondary | ICD-10-CM

## 2017-06-01 NOTE — Progress Notes (Signed)
   PRENATAL VISIT NOTE  Subjective:  Peggy Savage is a 27 y.o. G2P1001 at [redacted]w[redacted]d being seen today for ongoing prenatal care.  She is currently monitored for the following issues for this low-risk pregnancy and has Language barrier; Supervision of other normal pregnancy, antepartum; Vitamin D deficiency; and Anemia in pregnancy on their problem list.  Patient reports occasional contractions.  Contractions: Not present. Vag. Bleeding: None.  Movement: Present. Denies leaking of fluid.   The following portions of the patient's history were reviewed and updated as appropriate: allergies, current medications, past family history, past medical history, past social history, past surgical history and problem list. Problem list updated.  Objective:   Vitals:   06/01/17 1620  BP: 120/69  Pulse: 100  Weight: 217 lb 1.6 oz (98.5 kg)    Fetal Status: Fetal Heart Rate (bpm): 145 Fundal Height: 38 cm Movement: Present     General:  Alert, oriented and cooperative. Patient is in no acute distress.  Skin: Skin is warm and dry. No rash noted.   Cardiovascular: Normal heart rate noted  Respiratory: Normal respiratory effort, no problems with respiration noted  Abdomen: Soft, gravid, appropriate for gestational age. Cephalic by palpation  Pain/Pressure: Present     Pelvic: Cervical exam deferred        Extremities: Normal range of motion.  Edema: None  Mental Status:  Normal mood and affect. Normal behavior. Normal judgment and thought content.   Assessment and Plan:  Pregnancy: G2P1001 at [redacted]w[redacted]d  1. Supervision of other normal pregnancy, antepartum No issues  2. Language barrier Fish farm manager used   Term labor symptoms and general obstetric precautions including but not limited to vaginal bleeding, contractions, leaking of fluid and fetal movement were reviewed in detail with the patient. Please refer to After Visit Summary for other counseling recommendations.  Return in about 1 week  (around 06/08/2017) for OB visit.   Sloan Leiter, MD

## 2017-06-06 ENCOUNTER — Ambulatory Visit (INDEPENDENT_AMBULATORY_CARE_PROVIDER_SITE_OTHER): Payer: Medicaid Other | Admitting: Certified Nurse Midwife

## 2017-06-06 ENCOUNTER — Encounter: Payer: Self-pay | Admitting: Certified Nurse Midwife

## 2017-06-06 VITALS — BP 112/72 | HR 90 | Wt 217.2 lb

## 2017-06-06 DIAGNOSIS — Z3483 Encounter for supervision of other normal pregnancy, third trimester: Secondary | ICD-10-CM

## 2017-06-06 DIAGNOSIS — O26843 Uterine size-date discrepancy, third trimester: Secondary | ICD-10-CM

## 2017-06-06 DIAGNOSIS — Z789 Other specified health status: Secondary | ICD-10-CM

## 2017-06-06 DIAGNOSIS — O99013 Anemia complicating pregnancy, third trimester: Secondary | ICD-10-CM

## 2017-06-06 DIAGNOSIS — Z3480 Encounter for supervision of other normal pregnancy, unspecified trimester: Secondary | ICD-10-CM

## 2017-06-06 NOTE — Progress Notes (Signed)
   PRENATAL VISIT NOTE  Subjective:  Peggy Savage is a 27 y.o. G2P1001 at [redacted]w[redacted]d being seen today for ongoing prenatal care.  She is currently monitored for the following issues for this low-risk pregnancy and has Language barrier; Supervision of other normal pregnancy, antepartum; Vitamin D deficiency; and Anemia in pregnancy on their problem list.  Patient reports no complaints.  Contractions: Irregular. Vag. Bleeding: None.  Movement: Present. Denies leaking of fluid.   The following portions of the patient's history were reviewed and updated as appropriate: allergies, current medications, past family history, past medical history, past social history, past surgical history and problem list. Problem list updated.  Objective:   Vitals:   06/06/17 1619  BP: 112/72  Pulse: 90  Weight: 217 lb 3.2 oz (98.5 kg)    Fetal Status:     Movement: Present     General:  Alert, oriented and cooperative. Patient is in no acute distress.  Skin: Skin is warm and dry. No rash noted.   Cardiovascular: Normal heart rate noted  Respiratory: Normal respiratory effort, no problems with respiration noted  Abdomen: Soft, gravid, appropriate for gestational age.  Pain/Pressure: Present     Pelvic: Cervical exam deferred        Extremities: Normal range of motion.  Edema: None  Mental Status:  Normal mood and affect. Normal behavior. Normal judgment and thought content.   Assessment and Plan:  Pregnancy: G2P1001 at [redacted]w[redacted]d  1. Encounter for supervision of other normal pregnancy in third trimester   2. Supervision of other normal pregnancy, antepartum     S>D, [place] ordered  3. Language barrier     Video interpreter used  4. Anemia during pregnancy in third trimester     Taking iron  Term labor symptoms and general obstetric precautions including but not limited to vaginal bleeding, contractions, leaking of fluid and fetal movement were reviewed in detail with the patient. Please refer to After  Visit Summary for other counseling recommendations.  Return in about 1 week (around 06/13/2017) for [place].   Morene Crocker, CNM

## 2017-06-06 NOTE — Progress Notes (Signed)
Pt requests cx per Arabic translator "Ronnie" [phone].

## 2017-06-13 ENCOUNTER — Encounter: Payer: Self-pay | Admitting: Certified Nurse Midwife

## 2017-06-13 ENCOUNTER — Ambulatory Visit (INDEPENDENT_AMBULATORY_CARE_PROVIDER_SITE_OTHER): Payer: Medicaid Other | Admitting: Certified Nurse Midwife

## 2017-06-13 VITALS — BP 112/74 | HR 96 | Wt 219.0 lb

## 2017-06-13 DIAGNOSIS — Z3480 Encounter for supervision of other normal pregnancy, unspecified trimester: Secondary | ICD-10-CM

## 2017-06-13 DIAGNOSIS — E559 Vitamin D deficiency, unspecified: Secondary | ICD-10-CM

## 2017-06-13 DIAGNOSIS — Z789 Other specified health status: Secondary | ICD-10-CM

## 2017-06-13 NOTE — Progress Notes (Signed)
   PRENATAL VISIT NOTE  Subjective:  Peggy Savage is a 27 y.o. G2P1001 at [redacted]w[redacted]d being seen today for ongoing prenatal care.  She is currently monitored for the following issues for this low-risk pregnancy and has Language barrier; Supervision of other normal pregnancy, antepartum; Vitamin D deficiency; and Anemia in pregnancy on their problem list.  Patient reports no complaints.  Contractions: Not present. Vag. Bleeding: None.  Movement: Present. Denies leaking of fluid.   The following portions of the patient's history were reviewed and updated as appropriate: allergies, current medications, past family history, past medical history, past social history, past surgical history and problem list. Problem list updated.  Objective:   Vitals:   06/13/17 1619  BP: 112/74  Pulse: 96  Weight: 219 lb (99.3 kg)    Fetal Status: Fetal Heart Rate (bpm): 136; doppler Fundal Height: 42 cm Movement: Present     General:  Alert, oriented and cooperative. Patient is in no acute distress.  Skin: Skin is warm and dry. No rash noted.   Cardiovascular: Normal heart rate noted  Respiratory: Normal respiratory effort, no problems with respiration noted  Abdomen: Soft, gravid, appropriate for gestational age.  Pain/Pressure: Absent     Pelvic: Cervical exam deferred        Extremities: Normal range of motion.     Mental Status:  Normal mood and affect. Normal behavior. Normal judgment and thought content.   Assessment and Plan:  Pregnancy: G2P1001 at [redacted]w[redacted]d  1. Supervision of other normal pregnancy, antepartum     Doing well.  S>D: 42 cm, reports normal fetal movement.  Last baby was 3685 kg.  2. Vitamin D deficiency     Taking weekly vitamin D  3. Language barrier     Arabic video interpreter used.   Term labor symptoms and general obstetric precautions including but not limited to vaginal bleeding, contractions, leaking of fluid and fetal movement were reviewed in detail with the  patient. Please refer to After Visit Summary for other counseling recommendations.  Return in about 1 week (around 06/20/2017) for ROB, NST.   Morene Crocker, CNM

## 2017-06-16 ENCOUNTER — Encounter (HOSPITAL_COMMUNITY): Payer: Self-pay | Admitting: *Deleted

## 2017-06-16 ENCOUNTER — Other Ambulatory Visit: Payer: Self-pay | Admitting: Advanced Practice Midwife

## 2017-06-16 ENCOUNTER — Telehealth (HOSPITAL_COMMUNITY): Payer: Self-pay | Admitting: *Deleted

## 2017-06-16 NOTE — Telephone Encounter (Signed)
Preadmission screen interpreter number [id number]

## 2017-06-19 ENCOUNTER — Inpatient Hospital Stay (HOSPITAL_COMMUNITY): Payer: Medicaid Other | Admitting: Anesthesiology

## 2017-06-19 ENCOUNTER — Encounter: Payer: Medicaid Other | Admitting: Certified Nurse Midwife

## 2017-06-19 ENCOUNTER — Inpatient Hospital Stay (HOSPITAL_COMMUNITY)
Admission: AD | Admit: 2017-06-19 | Discharge: 2017-06-22 | DRG: 805 | Disposition: A | Discharge status: Home / Self Care | Payer: Medicaid Other | Source: Ambulatory Visit | Attending: Obstetrics & Gynecology | Admitting: Obstetrics & Gynecology

## 2017-06-19 ENCOUNTER — Encounter (HOSPITAL_COMMUNITY): Payer: Self-pay

## 2017-06-19 ENCOUNTER — Ambulatory Visit (HOSPITAL_COMMUNITY): Payer: Medicaid Other

## 2017-06-19 DIAGNOSIS — O411230 Chorioamnionitis, third trimester, not applicable or unspecified: Secondary | ICD-10-CM | POA: Diagnosis present

## 2017-06-19 DIAGNOSIS — O480 Post-term pregnancy: Secondary | ICD-10-CM | POA: Diagnosis present

## 2017-06-19 DIAGNOSIS — Z3A40 40 weeks gestation of pregnancy: Secondary | ICD-10-CM

## 2017-06-19 DIAGNOSIS — Z3A41 41 weeks gestation of pregnancy: Secondary | ICD-10-CM

## 2017-06-19 DIAGNOSIS — O8612 Endometritis following delivery: Secondary | ICD-10-CM | POA: Diagnosis not present

## 2017-06-19 DIAGNOSIS — D649 Anemia, unspecified: Secondary | ICD-10-CM | POA: Diagnosis present

## 2017-06-19 DIAGNOSIS — O9902 Anemia complicating childbirth: Secondary | ICD-10-CM | POA: Diagnosis present

## 2017-06-19 DIAGNOSIS — Z3480 Encounter for supervision of other normal pregnancy, unspecified trimester: Secondary | ICD-10-CM

## 2017-06-19 HISTORY — DX: Vitamin D deficiency, unspecified: E55.9

## 2017-06-19 LAB — CBC
HEMATOCRIT: 39.1 % (ref 36.0–46.0)
Hemoglobin: 13.1 g/dL (ref 12.0–15.0)
MCH: 29.9 pg (ref 26.0–34.0)
MCHC: 33.5 g/dL (ref 30.0–36.0)
MCV: 89.3 fL (ref 78.0–100.0)
Platelets: 196 10*3/uL (ref 150–400)
RBC: 4.38 MIL/uL (ref 3.87–5.11)
RDW: 17.1 % — AB (ref 11.5–15.5)
WBC: 8.8 10*3/uL (ref 4.0–10.5)

## 2017-06-19 LAB — HEMOGLOBIN AND HEMATOCRIT, BLOOD
HEMATOCRIT: 35.8 % — AB (ref 36.0–46.0)
Hemoglobin: 12.2 g/dL (ref 12.0–15.0)

## 2017-06-19 LAB — ABO/RH: ABO/RH(D): A POS

## 2017-06-19 LAB — TYPE AND SCREEN
ABO/RH(D): A POS
Antibody Screen: NEGATIVE

## 2017-06-19 MED ORDER — OXYCODONE-ACETAMINOPHEN 5-325 MG PO TABS: 1.0000 | ORAL_TABLET | ORAL | Status: DC | PRN

## 2017-06-19 MED ORDER — PHENYLEPHRINE 40 MCG/ML (10ML) SYRINGE FOR IV PUSH (FOR BLOOD PRESSURE SUPPORT)
80.0000 ug | PREFILLED_SYRINGE | INTRAVENOUS | Status: DC | PRN
  Filled 2017-06-19: qty 5

## 2017-06-19 MED ORDER — SOD CITRATE-CITRIC ACID 500-334 MG/5ML PO SOLN: 30.0000 mL | ORAL | Status: DC | PRN

## 2017-06-19 MED ORDER — OXYTOCIN 40 UNITS IN LACTATED RINGERS INFUSION - SIMPLE MED
2.5000 [IU]/h | INTRAVENOUS | Status: DC
  Filled 2017-06-19: qty 1000

## 2017-06-19 MED ORDER — MISOPROSTOL 50MCG HALF TABLET
50.0000 ug | ORAL_TABLET | ORAL | Status: DC | PRN
  Filled 2017-06-19: qty 1

## 2017-06-19 MED ORDER — OXYCODONE-ACETAMINOPHEN 5-325 MG PO TABS: 2.0000 | ORAL_TABLET | ORAL | Status: DC | PRN

## 2017-06-19 MED ORDER — ACETAMINOPHEN 325 MG PO TABS: 650.0000 mg | ORAL_TABLET | ORAL | Status: DC | PRN

## 2017-06-19 MED ORDER — COCONUT OIL OIL
1.0000 "application " | TOPICAL_OIL | Status: DC | PRN
  Administered 2017-06-20: 1 via TOPICAL
  Filled 2017-06-19: qty 120

## 2017-06-19 MED ORDER — ONDANSETRON HCL 4 MG/2ML IJ SOLN
4.0000 mg | INTRAMUSCULAR | Status: DC | PRN
Start: 2017-06-19 — End: 2017-06-22

## 2017-06-19 MED ORDER — WITCH HAZEL-GLYCERIN EX PADS: 1.0000 "application " | MEDICATED_PAD | CUTANEOUS | Status: DC | PRN

## 2017-06-19 MED ORDER — MISOPROSTOL 200 MCG PO TABS
800.0000 ug | ORAL_TABLET | Freq: Once | ORAL | Status: AC
  Administered 2017-06-19: 800 ug via RECTAL

## 2017-06-19 MED ORDER — FENTANYL CITRATE (PF) 100 MCG/2ML IJ SOLN
INTRAMUSCULAR | Status: AC
  Filled 2017-06-19: qty 2

## 2017-06-19 MED ORDER — LACTATED RINGERS IV SOLN: INTRAVENOUS | Status: DC

## 2017-06-19 MED ORDER — BENZOCAINE-MENTHOL 20-0.5 % EX AERO: 1.0000 "application " | INHALATION_SPRAY | CUTANEOUS | Status: DC | PRN

## 2017-06-19 MED ORDER — DIBUCAINE 1 % RE OINT: 1.0000 "application " | TOPICAL_OINTMENT | RECTAL | Status: DC | PRN

## 2017-06-19 MED ORDER — OXYTOCIN BOLUS FROM INFUSION
500.0000 mL | Freq: Once | INTRAVENOUS | Status: AC
  Administered 2017-06-19: 500 mL via INTRAVENOUS

## 2017-06-19 MED ORDER — LACTATED RINGERS IV SOLN: 500.0000 mL | INTRAVENOUS | Status: DC | PRN

## 2017-06-19 MED ORDER — LIDOCAINE HCL (PF) 1 % IJ SOLN
INTRAMUSCULAR | Status: DC | PRN
  Administered 2017-06-19: 3 mL
  Administered 2017-06-19: 5 mL
  Administered 2017-06-19: 2 mL

## 2017-06-19 MED ORDER — FENTANYL CITRATE (PF) 100 MCG/2ML IJ SOLN
100.0000 ug | INTRAMUSCULAR | Status: DC | PRN
  Administered 2017-06-19: 100 ug via INTRAVENOUS
  Filled 2017-06-19: qty 2

## 2017-06-19 MED ORDER — FENTANYL CITRATE (PF) 100 MCG/2ML IJ SOLN: 100.0000 ug | Freq: Once | INTRAMUSCULAR | Status: DC

## 2017-06-19 MED ORDER — OXYTOCIN BOLUS FROM INFUSION: 500.0000 mL | Freq: Once | INTRAVENOUS | Status: DC

## 2017-06-19 MED ORDER — SIMETHICONE 80 MG PO CHEW: 80.0000 mg | CHEWABLE_TABLET | ORAL | Status: DC | PRN

## 2017-06-19 MED ORDER — ONDANSETRON HCL 4 MG/2ML IJ SOLN: 4.0000 mg | Freq: Four times a day (QID) | INTRAMUSCULAR | Status: DC | PRN

## 2017-06-19 MED ORDER — ONDANSETRON HCL 4 MG PO TABS: 4.0000 mg | ORAL_TABLET | ORAL | Status: DC | PRN

## 2017-06-19 MED ORDER — PRENATAL MULTIVITAMIN CH
1.0000 | ORAL_TABLET | Freq: Every day | ORAL | Status: DC
  Administered 2017-06-20 – 2017-06-21 (×2): 1 via ORAL
  Filled 2017-06-19 (×2): qty 1

## 2017-06-19 MED ORDER — PHENYLEPHRINE 40 MCG/ML (10ML) SYRINGE FOR IV PUSH (FOR BLOOD PRESSURE SUPPORT)
80.0000 ug | PREFILLED_SYRINGE | INTRAVENOUS | Status: DC | PRN
Start: 2017-06-19 — End: 2017-06-19
  Filled 2017-06-19: qty 10
  Filled 2017-06-19: qty 5

## 2017-06-19 MED ORDER — OXYTOCIN 40 UNITS IN LACTATED RINGERS INFUSION - SIMPLE MED: 2.5000 [IU]/h | INTRAVENOUS | Status: DC

## 2017-06-19 MED ORDER — FENTANYL CITRATE (PF) 100 MCG/2ML IJ SOLN: 100.0000 ug | INTRAMUSCULAR | Status: DC | PRN

## 2017-06-19 MED ORDER — TETANUS-DIPHTH-ACELL PERTUSSIS 5-2.5-18.5 LF-MCG/0.5 IM SUSP: 0.5000 mL | Freq: Once | INTRAMUSCULAR | Status: DC

## 2017-06-19 MED ORDER — DIPHENHYDRAMINE HCL 25 MG PO CAPS: 25.0000 mg | ORAL_CAPSULE | Freq: Four times a day (QID) | ORAL | Status: DC | PRN

## 2017-06-19 MED ORDER — EPHEDRINE 5 MG/ML INJ
10.0000 mg | INTRAVENOUS | Status: DC | PRN
  Filled 2017-06-19: qty 2

## 2017-06-19 MED ORDER — LACTATED RINGERS IV SOLN: 500.0000 mL | Freq: Once | INTRAVENOUS | Status: DC

## 2017-06-19 MED ORDER — MISOPROSTOL 200 MCG PO TABS
ORAL_TABLET | ORAL | Status: AC
  Administered 2017-06-19: 800 ug via RECTAL
  Filled 2017-06-19: qty 4

## 2017-06-19 MED ORDER — PHENYLEPHRINE 40 MCG/ML (10ML) SYRINGE FOR IV PUSH (FOR BLOOD PRESSURE SUPPORT): 80.0000 ug | PREFILLED_SYRINGE | INTRAVENOUS | Status: DC | PRN

## 2017-06-19 MED ORDER — ZOLPIDEM TARTRATE 5 MG PO TABS
5.0000 mg | ORAL_TABLET | Freq: Every evening | ORAL | Status: DC | PRN
Start: 2017-06-19 — End: 2017-06-22

## 2017-06-19 MED ORDER — SENNOSIDES-DOCUSATE SODIUM 8.6-50 MG PO TABS
2.0000 | ORAL_TABLET | ORAL | Status: DC
  Administered 2017-06-19 – 2017-06-21 (×3): 2 via ORAL
  Filled 2017-06-19 (×3): qty 2

## 2017-06-19 MED ORDER — TERBUTALINE SULFATE 1 MG/ML IJ SOLN
0.2500 mg | Freq: Once | INTRAMUSCULAR | Status: DC | PRN
  Filled 2017-06-19: qty 1

## 2017-06-19 MED ORDER — LIDOCAINE HCL (PF) 1 % IJ SOLN
30.0000 mL | INTRAMUSCULAR | Status: DC | PRN
  Filled 2017-06-19: qty 30

## 2017-06-19 MED ORDER — METHYLERGONOVINE MALEATE 0.2 MG/ML IJ SOLN
INTRAMUSCULAR | Status: AC
  Administered 2017-06-19: 0.2 mg via INTRAMUSCULAR
  Filled 2017-06-19: qty 1

## 2017-06-19 MED ORDER — LIDOCAINE HCL (PF) 1 % IJ SOLN: 30.0000 mL | INTRAMUSCULAR | Status: DC | PRN

## 2017-06-19 MED ORDER — METHYLERGONOVINE MALEATE 0.2 MG/ML IJ SOLN
0.2000 mg | Freq: Once | INTRAMUSCULAR | Status: AC
Start: 2017-06-19 — End: 2017-06-19
  Administered 2017-06-19: 0.2 mg via INTRAMUSCULAR

## 2017-06-19 MED ORDER — DIPHENHYDRAMINE HCL 50 MG/ML IJ SOLN: 12.5000 mg | INTRAMUSCULAR | Status: DC | PRN

## 2017-06-19 MED ORDER — IBUPROFEN 600 MG PO TABS
600.0000 mg | ORAL_TABLET | Freq: Four times a day (QID) | ORAL | Status: DC
  Administered 2017-06-19 – 2017-06-21 (×7): 600 mg via ORAL
  Filled 2017-06-19 (×7): qty 1

## 2017-06-19 MED ORDER — EPHEDRINE 5 MG/ML INJ: 10.0000 mg | INTRAVENOUS | Status: DC | PRN

## 2017-06-19 MED ORDER — FENTANYL 2.5 MCG/ML BUPIVACAINE 1/10 % EPIDURAL INFUSION (WH - ANES)
14.0000 mL/h | INTRAMUSCULAR | Status: DC | PRN
  Administered 2017-06-19: 14 mL/h via EPIDURAL
  Filled 2017-06-19: qty 100

## 2017-06-19 NOTE — Progress Notes (Signed)
Peggy Savage is a 27 y.o. G2P1001 at [redacted]w[redacted]d admitted for active labor  Subjective: No complaints.   Objective: BP 118/74   Pulse 79   Temp 97.7 F (36.5 C) (Oral)   Resp 18   Ht 5\' 6"  (1.676 m)   Wt 218 lb (98.9 kg)   SpO2 100%   BMI 35.19 kg/m  No intake/output data recorded.  FHT:  FHR: 130 bpm, variability: moderate,  accelerations:  Present,  decelerations:  Absent UC:   irregular, every 1-4 minutes SVE:   Dilation: 6.5 Effacement (%): 80 Station: -2 Exam by:: Bodin Gorka  AROM for moderate fluid with light meconium  Labs: Lab Results  Component Value Date   WBC 8.8 06/19/2017   HGB 13.1 06/19/2017   HCT 39.1 06/19/2017   MCV 89.3 06/19/2017   PLT 196 06/19/2017    Assessment / Plan: Spontaneous labor, s/p AROM for augmentation  Labor: Continue expectant management Fetal Wellbeing:  Category I Pain Control:  IV pain meds as needed I/D:  GBS neg Anticipated MOD:  NSVD  Verita Schneiders, MD 06/19/2017, 2:07 PM

## 2017-06-19 NOTE — Anesthesia Postprocedure Evaluation (Signed)
Anesthesia Post Note  Patient: Peggy Savage  Procedure(s) Performed: AN AD HOC LABOR EPIDURAL     Patient location during evaluation: Mother Baby Anesthesia Type: Epidural Level of consciousness: awake and alert and oriented Pain management: satisfactory to patient Vital Signs Assessment: post-procedure vital signs reviewed and stable Respiratory status: spontaneous breathing and nonlabored ventilation Cardiovascular status: stable Postop Assessment: no headache, no backache, no signs of nausea or vomiting, adequate PO intake and patient able to bend at knees (patient up walking) Anesthetic complications: no    Last Vitals:  Vitals:   06/19/17 1945 06/19/17 2045  BP: 114/66 113/62  Pulse: 65 66  Resp: 20 18  Temp: 36.9 C 37.1 C  SpO2: 97% 98%    Last Pain:  Vitals:   06/19/17 2045  TempSrc: Oral  PainSc:    Pain Goal:                 Willa Rough

## 2017-06-19 NOTE — H&P (Signed)
Obstetric History and Physical  Peggy Savage is a 27 y.o. G2P1001 with IUP at [redacted]w[redacted]d presenting for SOL. Patient states she has been having  regular contractions since this morning, none vaginal bleeding, intact membranes, with active fetal movement.    Prenatal Course Source of Care: [facility]  with onset of care at 11 weeks Dating: By [place] --->  Estimated Date of Delivery: 08/26/23 Pregnancy complications or risks: Patient Active Problem List   Diagnosis Date Noted  . Anemia in pregnancy 03/16/2017  . Vitamin D deficiency 11/29/2016  . Supervision of other normal pregnancy, antepartum 11/21/2016  . Language barrier 11/10/2015   She plans to breastfeed She undecided for postpartum contraception.   Sono:    @[redacted]w[redacted]d , CWD, normal anatomy, breech presentation, anterior placenta, 746g, 66% EFW  Prenatal labs and studies: ABO, Rh: A/Positive/-- (08/06 1704) Antibody: Negative (08/06 1704) Rubella: 4.92 (08/06 1704) RPR: Non Reactive (11/26 1100)  HBsAg: Negative (08/06 1704)  HIV: Non Reactive (11/26 1100)  ENI:DPOEUMPN (01/24 1703) 2 hr Glucola  normal Genetic screening normal Anatomy US normal  Prenatal Transfer Tool  Maternal Diabetes: No Genetic Screening: Normal Maternal Ultrasounds/Referrals: Normal Fetal Ultrasounds or other Referrals:  None Maternal Substance Abuse:  No Significant Maternal Medications:  None Significant Maternal Lab Results: Lab values include: Group B Strep negative  Past Medical History:  Diagnosis Date  . Anemia     Past Surgical History:  Procedure Laterality Date  . NO PAST SURGERIES      OB History  Gravida Para Term Preterm AB Living  2 1 1  0 0 1  SAB TAB Ectopic Multiple Live Births  0 0 0 0 1    # Outcome Date GA Lbr Len/2nd Weight Sex Delivery Anes PTL Lv  2 Current           1 Term 12/01/15 [redacted]w[redacted]d 06:49 / 02:03 3.685 kg (8 lb 2 oz) F Vag-Spont EPI  LIV      Social History   Socioeconomic History  . Marital status:  Married    Spouse name: None  . Number of children: None  . Years of education: None  . Highest education level: None  Social Needs  . Financial resource strain: None  . Food insecurity - worry: None  . Food insecurity - inability: None  . Transportation needs - medical: None  . Transportation needs - non-medical: None  Occupational History  . None  Tobacco Use  . Smoking status: Never Smoker  . Smokeless tobacco: Never Used  Substance and Sexual Activity  . Alcohol use: No  . Drug use: No  . Sexual activity: Yes    Birth control/protection: None  Other Topics Concern  . None  Social History Narrative  . None    Family History  Problem Relation Age of Onset  . Diabetes Mother   . Hypertension Mother   . Diabetes Father   . Hypertension Father     Medications Prior to Admission  Medication Sig Dispense Refill Last Dose  . Prenat-FeAsp-Meth-FA-DHA w/o A (PRENATE PIXIE) 10-0.6-0.4-200 MG CAPS TK 1 C PO ONCE DAILY  12 Past Week at Unknown time  . Prenatal-DSS-FeCb-FeGl-FA (CITRANATAL BLOOM) 90-1 MG TABS Take 1 tablet by mouth daily. 30 tablet 12 Past Week at Unknown time  . Elastic Bandages & Supports (COMFORT FIT MATERNITY SUPP SM) MISC Wear as directed. (Patient not taking: Reported on 06/06/2017) 1 each 0 Not Taking  . omeprazole (PRILOSEC) 20 MG capsule Take 1 capsule (20 mg total) by  mouth 2 (two) times daily before a meal. (Patient not taking: Reported on 05/25/2017) 60 capsule 5 Not Taking  . Prenat-Fe Poly-Methfol-FA-DHA (VITAFOL ULTRA) 29-0.6-0.4-200 MG CAPS Take 1 tablet by mouth daily. 30 capsule 12   . Vitamin D, Ergocalciferol, (DRISDOL) 50000 units CAPS capsule Take 1 capsule (50,000 Units total) by mouth every 7 (seven) days. 30 capsule 2 06/13/2017    No Known Allergies  Review of Systems: Negative except for what is mentioned in HPI.  Physical Exam: BP 124/80   Pulse (!) 113   Temp 98.2 F (36.8 C) (Oral)   Resp 16   Wt 98.9 kg (218 lb)   SpO2 100%    BMI 32.19 kg/m  CONSTITUTIONAL: Well-developed, well-nourished female in no acute distress.  HENT:  Normocephalic, atraumatic, External right and left ear normal. Oropharynx is clear and moist EYES: Conjunctivae and EOM are normal. Pupils are equal, round, and reactive to light. No scleral icterus.  NECK: Normal range of motion, supple, no masses SKIN: Skin is warm and dry. No rash noted. Not diaphoretic. No erythema. No pallor. NEUROLOGIC: Alert and oriented to person, place, and time. Normal reflexes, muscle tone coordination. No cranial nerve deficit noted. PSYCHIATRIC: Normal mood and affect. Normal behavior. Normal judgment and thought content. CARDIOVASCULAR: Normal heart rate noted, regular rhythm RESPIRATORY: Effort and breath sounds normal, no problems with respiration noted ABDOMEN: Soft, nontender, nondistended, gravid. MUSCULOSKELETAL: Normal range of motion. No edema and no tenderness. 2+ distal pulses.  Cervical Exam: Dilation: 7 Effacement (%): 80 Cervical Position: Middle Station: -2 Presentation: Vertex Exam by:: Juliette Alcide  Presentation: cephalic FHT:  Baseline rate 130 bpm   Variability moderate  Accelerations present   Decelerations none Contractions: Every 4-6 mins   Pertinent Labs/Studies:   Results for orders placed or performed during the hospital encounter of 06/19/17 (from the past 24 hour(s))  CBC     Status: Abnormal   Collection Time: 06/19/17 11:00 AM  Result Value Ref Range   WBC 8.8 4.0 - 10.5 K/uL   RBC 4.38 3.87 - 5.11 MIL/uL   Hemoglobin 13.1 12.0 - 15.0 g/dL   HCT 39.1 36.0 - 46.0 %   MCV 89.3 78.0 - 100.0 fL   MCH 29.9 26.0 - 34.0 pg   MCHC 33.5 30.0 - 36.0 g/dL   RDW 17.1 (H) 11.5 - 15.5 %   Platelets 196 150 - 400 K/uL  Type and screen     Status: None   Collection Time: 06/19/17 11:00 AM  Result Value Ref Range   ABO/RH(D) A POS    Antibody Screen NEG    Sample Expiration      06/22/2017 Performed at [facility], [address], [place], [place] [address]     Assessment : Peggy Savage is a 27 y.o. G2P1001 at [redacted]w[redacted]d being admitted for labor.  Plan: Labor: Expectant management Analgesia as needed. Mom wants to go natural. FWB: Reassuring fetal heart tracing.   GBS negative Delivery plan: Hopeful for vaginal delivery   Luiz Blare, DO [facility] [place] 06/19/2017, 10:47 AM

## 2017-06-19 NOTE — MAU Note (Signed)
Urine in lab 

## 2017-06-19 NOTE — Anesthesia Procedure Notes (Signed)
Epidural Patient location during procedure: OB Start time: 06/19/2017 2:50 PM End time: 06/19/2017 2:58 PM  Staffing Anesthesiologist: Suzette Battiest, MD Performed: anesthesiologist   Preanesthetic Checklist Completed: patient identified, site marked, surgical consent, pre-op evaluation, timeout performed, IV checked, risks and benefits discussed and monitors and equipment checked  Epidural Patient position: sitting Prep: site prepped and draped and DuraPrep Patient monitoring: continuous pulse ox and blood pressure Approach: midline Location: L4-L5 Injection technique: LOR air  Needle:  Needle type: Tuohy  Needle gauge: 17 G Needle length: 9 cm and 9 Needle insertion depth: 6 cm Catheter type: closed end flexible Catheter size: 19 Gauge Catheter at skin depth: 11 cm Test dose: negative  Assessment Events: blood not aspirated, injection not painful, no injection resistance, negative IV test and no paresthesia

## 2017-06-19 NOTE — Progress Notes (Signed)
Postpartum Hemorrhage moderate bleeding first noted at 1820.  RN notified Luiz Blare DO at 1900 after straight cath and fundal massage. Fundus  Firm right and 2 above umbilicus with an additional EBL of 373 ml. Luiz Blare, DO at bedside at 1904. Manual removal of clots  Yielding 719 ml additional blood loss. Cytotec 800 mcg PR, and Methergine 0.2 mg IM administered. Patient stable scant bleeding noted fundus 1 above umbilicus at 8466. Total EBL 1442 ml. Interpreter # [id number] through out Hemorrhage.

## 2017-06-19 NOTE — MAU Note (Signed)
Pt having ctx 15 minutes apart, no LOF no bleeding. Has an induction this Thursday 7am.

## 2017-06-19 NOTE — MAU Note (Signed)
Pt having ctx.

## 2017-06-19 NOTE — Anesthesia Pain Management Evaluation Note (Signed)
  CRNA Pain Management Visit Note  Patient: Peggy Savage, 27 y.o., female  "Hello I am a member of the anesthesia team at [facility]. We have an anesthesia team available at all times to provide care throughout the hospital, including epidural management and anesthesia for C-section. I don't know your plan for the delivery whether it a natural birth, water birth, IV sedation, nitrous supplementation, doula or epidural, but we want to meet your pain goals."   1.Was your pain managed to your expectations on prior hospitalizations?   Yes   2.What is your expectation for pain management during this hospitalization?     IV pain meds  3.How can we help you reach that goal? IV pain Rx  Record the patient's initial score and the patient's pain goal.   Pain: 7  Pain Goal: 7 [facility] wants you to be able to say your pain was always managed very well.  Peggy Savage 06/19/2017

## 2017-06-19 NOTE — Anesthesia Preprocedure Evaluation (Signed)
Anesthesia Evaluation  Patient identified by MRN, date of birth, ID band Patient awake    Reviewed: Allergy & Precautions, Patient's Chart, lab work & pertinent test results  Airway Mallampati: II  TM Distance: >3 FB     Dental   Pulmonary neg pulmonary ROS,    Pulmonary exam normal        Cardiovascular negative cardio ROS Normal cardiovascular exam     Neuro/Psych    GI/Hepatic negative GI ROS, Neg liver ROS,   Endo/Other  negative endocrine ROS  Renal/GU negative Renal ROS     Musculoskeletal   Abdominal   Peds  Hematology negative hematology ROS (+)   Anesthesia Other Findings   Reproductive/Obstetrics (+) Pregnancy                             Lab Results  Component Value Date   WBC 8.8 06/19/2017   HGB 13.1 06/19/2017   HCT 39.1 06/19/2017   MCV 89.3 06/19/2017   PLT 196 06/19/2017    Anesthesia Physical Anesthesia Plan  ASA: II  Anesthesia Plan: Epidural   Post-op Pain Management:    Induction:   PONV Risk Score and Plan: Treatment may vary due to age or medical condition  Airway Management Planned: Natural Airway  Additional Equipment:   Intra-op Plan:   Post-operative Plan:   Informed Consent: I have reviewed the patients History and Physical, chart, labs and discussed the procedure including the risks, benefits and alternatives for the proposed anesthesia with the patient or authorized representative who has indicated his/her understanding and acceptance.     Plan Discussed with:   Anesthesia Plan Comments:         Anesthesia Quick Evaluation

## 2017-06-20 ENCOUNTER — Encounter (HOSPITAL_COMMUNITY): Payer: Self-pay

## 2017-06-20 ENCOUNTER — Ambulatory Visit (HOSPITAL_COMMUNITY): Payer: Medicaid Other

## 2017-06-20 LAB — CBC
HEMATOCRIT: 33.9 % — AB (ref 36.0–46.0)
HEMOGLOBIN: 11.5 g/dL — AB (ref 12.0–15.0)
MCH: 30.6 pg (ref 26.0–34.0)
MCHC: 33.9 g/dL (ref 30.0–36.0)
MCV: 90.2 fL (ref 78.0–100.0)
Platelets: 194 10*3/uL (ref 150–400)
RBC: 3.76 MIL/uL — AB (ref 3.87–5.11)
RDW: 16.9 % — ABNORMAL HIGH (ref 11.5–15.5)
WBC: 14 10*3/uL — AB (ref 4.0–10.5)

## 2017-06-20 LAB — RPR: RPR Ser Ql: NONREACTIVE

## 2017-06-20 MED ORDER — IBUPROFEN 600 MG PO TABS: 600.0000 mg | ORAL_TABLET | Freq: Four times a day (QID) | ORAL | 0 refills | Status: DC

## 2017-06-20 NOTE — Progress Notes (Signed)
Live interpreter used to go over plan of care. Breakfast ordered. Call bell and pain med management options explained. Emergency bell explained. Patient states she is not dizzy upon ambulating her to the Bathroom. Patient stated she is not in any pain. Patient aware to call RN if she starts to not feel well or get dizzy or for any needs she has. She was told we can call the interpreter for explanations. Fundus firm no clots no gushing noted.

## 2017-06-20 NOTE — Progress Notes (Signed)
Interpreter Zayneb ID# [id number] used to introduce self, order lunch, discuss pain and medications due, 24 hr screens on baby, and go over first bottle/formula feed. LEAD discussed using interpreter.   Parent request formula to supplement breast feeding due to mother's choice.Parents have been informed of small tummy size of newborn, taught hand expression and understands the possible consequences of formula to the health of the infant. The possible consequences shared with patient include 1) Loss of confidence in breastfeeding 2) Engorgement 3) Allergic sensitization of baby(asthma/allergies) and 4) decreased milk supply for mother.After discussion of the above the mother decided to use formula. The tool used to give formula supplement will be bottle.

## 2017-06-20 NOTE — Progress Notes (Signed)
Interpreter Samir ID [phone] used to get patient's dinner order, ask about pain and to go over infant's 24 hour screens.

## 2017-06-20 NOTE — Progress Notes (Addendum)
Post Partum Day 1 Subjective: no complaints, up ad lib, voiding and tolerating PO  Objective: Blood pressure (!) 100/53, pulse 65, temperature 98.4 F (36.9 C), resp. rate 20, height 5\' 6"  (1.676 m), weight 218 lb (98.9 kg), SpO2 98 %, unknown if currently breastfeeding.  Physical Exam:  General: alert, cooperative and no distress Lochia: appropriate Uterine Fundus: firm Incision: n/a DVT Evaluation: No evidence of DVT seen on physical exam.  Recent Labs    06/19/17 1100 06/19/17 1920  HGB 13.1 12.2  HCT 39.1 35.8*    Assessment/Plan: Plan for discharge tomorrow and Breastfeeding  Stable overnight. No more episodes of heavy vaginal bleeding. Bleeding minimal this am.    LOS: 1 day   [place] 06/20/2017, 7:17 AM

## 2017-06-20 NOTE — Discharge Summary (Signed)
POSTPARTUM PROGRESS NOTE  Post Partum Day 2 Subjective:  Peggy Savage is a 27 y.o. [id number] [redacted]w[redacted]d s/p SVD.  No acute events overnight.  Pt denies problems with ambulating, voiding or po intake.  She denies nausea or vomiting.  Pain is well controlled. Lochia Minimal.   Objective: Blood pressure (!) 99/59, pulse 87, temperature 97.7 F (36.5 C), temperature source Oral, resp. rate 18, height 5\' 6"  (1.676 m), weight 91.5 kg (201 lb 11.2 oz), SpO2 99 %, unknown if currently breastfeeding.  Physical Exam:  General: alert, cooperative and no distress Lochia:normal flow Chest: no respiratory distress Heart:regular rate, distal pulses intact Abdomen: soft, nontender,  Uterine Fundus: firm, appropriately tender DVT Evaluation: No calf swelling or tenderness Extremities: no edema  Recent Labs    06/19/17 1920 06/20/17 0834  HGB 12.2 11.5*  HCT 35.8* 33.9*    Assessment/Plan:  ASSESSMENT: Peggy Savage is a 27 y.o. [id number] [redacted]w[redacted]d s/p SVD.  Discharge home   LOS: 2 days   Pascal Stiggers MossDO 06/21/2017, 6:48 AM

## 2017-06-20 NOTE — Discharge Instructions (Signed)

## 2017-06-20 NOTE — Lactation Note (Signed)
This note was copied from a baby's chart. Lactation Consultation Note  Patient Name: Peggy Savage [id number] Date: 06/20/2017 Reason for consult: Initial assessment   Velna Hatchet used for Arabic [phone]. Mother states she bf her first child for 3 mos until she was put on antibiotics and was told by her MD to stop bf. When she resumed her milk supply had decreased. Reviewed our phone number to call for questions. Mother recently started formula and states that the latch hurts. No trauma noted. Mother latched baby in cradle hold with no support. Turned baby tummy to tummy and provided pillows for support and mother stated latch felt better. Encouraged bf before offering formula to help establish her milk supply. Mom encouraged to feed baby 8-12 times/24 hours and with feeding cues.  Mom made aware of O/P services, breastfeeding support groups, community resources, and our phone # for post-discharge questions.      Maternal Data Has patient been taught Hand Expression?: Yes Does the patient have breastfeeding experience prior to this delivery?: Yes  Feeding Feeding Type: Breast Fed Nipple Type: Slow - flow  LATCH Score Latch: Grasps breast easily, tongue down, lips flanged, rhythmical sucking.  Audible Swallowing: A few with stimulation  Type of Nipple: Everted at rest and after stimulation  Comfort (Breast/Nipple): Filling, red/small blisters or bruises, mild/mod discomfort  Hold (Positioning): Assistance needed to correctly position infant at breast and maintain latch.  LATCH Score: 7  Interventions Interventions: Assisted with latch;Support pillows;Adjust position  Lactation Tools Discussed/Used     Consult Status Consult Status: Follow-up Date: 06/21/17 Follow-up type: In-patient    Vivianne Master [facility] 06/20/2017, 2:34 PM

## 2017-06-21 LAB — URINALYSIS, ROUTINE W REFLEX MICROSCOPIC
BILIRUBIN URINE: NEGATIVE
GLUCOSE, UA: NEGATIVE mg/dL
KETONES UR: NEGATIVE mg/dL
NITRITE: NEGATIVE
Protein, ur: 30 mg/dL — AB
Specific Gravity, Urine: 1.019 (ref 1.005–1.030)
pH: 5 (ref 5.0–8.0)

## 2017-06-21 MED ORDER — CLINDAMYCIN PHOSPHATE 900 MG/50ML IV SOLN
900.0000 mg | Freq: Three times a day (TID) | INTRAVENOUS | Status: DC
  Administered 2017-06-21 – 2017-06-22 (×3): 900 mg via INTRAVENOUS
  Filled 2017-06-21 (×5): qty 50

## 2017-06-21 MED ORDER — GENTAMICIN SULFATE 40 MG/ML IJ SOLN
5.0000 mg/kg | INTRAVENOUS | Status: DC
  Administered 2017-06-21: 360 mg via INTRAVENOUS
  Filled 2017-06-21 (×2): qty 9

## 2017-06-21 MED ORDER — OXYCODONE HCL 5 MG PO TABS
5.0000 mg | ORAL_TABLET | Freq: Four times a day (QID) | ORAL | Status: DC | PRN
  Administered 2017-06-21: 5 mg via ORAL
  Filled 2017-06-21: qty 1

## 2017-06-21 MED ORDER — GENTAMICIN SULFATE 40 MG/ML IJ SOLN: 5.0000 mg/kg | INTRAMUSCULAR | Status: DC

## 2017-06-21 MED ORDER — ACETAMINOPHEN 325 MG PO TABS
650.0000 mg | ORAL_TABLET | Freq: Once | ORAL | Status: AC
  Administered 2017-06-21: 650 mg via ORAL
  Filled 2017-06-21: qty 2

## 2017-06-21 NOTE — Progress Notes (Signed)
Called to assess patient. Oral temperature of 101.3. Pt also complaining of mild but worse than before suprapubic pain. Denies dysuria or hematuria. No other sig abdominal pain. No cough or shortness of breath. Did have pph with manual exploration of uterine cavity.  Last cytotec was 2 days ago. Has bilateral nipple pain but no unilateral other breast pain. No perineal repair with labor. On exam well appearing. Nipples irritated, no focal areas of breast induration/redness/swelling. Lungs CTAB. Mild suprapubic tenderness. Rn report lochia normal. Will check urinalysis/culture. Will start IV clinda/gent for presumed PP endometritis. Patient is well appearing and appears stable.

## 2017-06-21 NOTE — Lactation Note (Signed)
This note was copied from a baby's chart. Lactation Consultation Note  Patient Name: Peggy Savage [id number] Date: 06/21/2017 Reason for consult: Follow-up assessment;Nipple pain/trauma;Term   Follow up with mom of 56 hour old infant prior to d/c home. Spoke with mother with assistance of [employer] # [phone]. Infant with 7 BF for 15-60 minutes, bottle feeds of 15-59 ml, 5 voids and 1 stool in the last 24 hours. Infant weight 8 pounds 13.3 ounces with 6% weight loss since birth. LATCH scores 7-9. Mom did have a > 1300 cc EBL, breast milk is coming in. Infant was awake and crying when LC entered room, mom was changing her diaper.   Mom reports infant prefers the breast over the formula. Mom reports she is having nipple pain throughout feedings. She is using coconut  oil to nipples. Nipple tissue intact. Mom with compressible filling breasts. Infant was crying, enc mom to latch infant to the breast.   Mom latched infant to the left breast in the cradle hold. Mom was allowing infant to latch shallowly. Infant with lips curled in. Enc mom to use good pillow support and to latch in the cross cradle hold. Showed mom how to deepen latch and to flange lips. Mom with tenderness with initial latch that improved with feeding to no pain per mom. Mom kept allowing infant to slip off the nipple, enc her to keep nose, chin and cheeks touching the breast with feedings. Infant with a lot os swallows with feeding. Enc mom to massage/compress breast with feeding and swallows were noted to increase. Enc mom to stimulate infant at the breast as needed to maintain active suckling. Infant self detached and fell asleep. Mom's nipple was rounded post feeding. Enc mom to offer both breasts with each feeding and reassured her that cluster feeding is normal. Enc mom to only give formula if infant has fed on both breasts and still hungry.   Reviewed I/O, signs of dehydration in the infant, engorgement  prevention/treatment, and breast milk expression and storage. Mom reports she has a pump at home. Mom is a [facility] Client and has spoken with them. She was informed she can call [facility] with BF questions prn. Reviewed [place], mom aware of OP services, BF Support Groups and [place] phone #. Mom also aware of [facility] services. Enc mom to call [facility] for assistance as needed.   Mom had lots of good questions that were all answered. Mom did have questions about a nurse listening to her baby's heart. Reported to Cyril Mourning, RN who was in the room and answered mom 's questions that the MD and RN had assessed infant's heart this morning. Report to Viviann Spare, RN.    Maternal Data Has patient been taught Hand Expression?: Yes Does the patient have breastfeeding experience prior to this delivery?: Yes  Feeding Feeding Type: Breast Fed Length of feed: 15 min  LATCH Score Latch: Grasps breast easily, tongue down, lips flanged, rhythmical sucking.  Audible Swallowing: Spontaneous and intermittent  Type of Nipple: Everted at rest and after stimulation  Comfort (Breast/Nipple): Filling, red/small blisters or bruises, mild/mod discomfort  Hold (Positioning): Assistance needed to correctly position infant at breast and maintain latch.  LATCH Score: 8  Interventions Interventions: Breast feeding basics reviewed;Support pillows;Assisted with latch;Position options;Skin to skin;Expressed milk;Breast massage;Breast compression;Coconut oil;Pre-pump if needed;Hand express;Adjust position  Lactation Tools Discussed/Used WIC Program: Yes Pump Review: Milk Storage   Consult Status Consult Status: Complete Follow-up type: Call as needed  Debby Freiberg Yumiko Alkins 06/21/2017, 11:23 AM

## 2017-06-22 ENCOUNTER — Inpatient Hospital Stay (HOSPITAL_COMMUNITY): Payer: Medicaid Other

## 2017-06-22 LAB — URINE CULTURE: SPECIAL REQUESTS: NORMAL

## 2017-06-22 NOTE — Discharge Summary (Signed)
OB Discharge Summary    Patient Name: Peggy Savage DOB: 05-03-1990 MRN: [id number] Date of admission: 06/19/2017  Delivering MD: Katheren Shams )  Date of discharge: 06/22/2017  Admitting diagnosis: SOL Intrauterine pregnancy: [redacted]w[redacted]d    Secondary diagnosis:  Active Problems:   Patient Active Problem List   Diagnosis Date Noted  . Post term pregnancy at [redacted] weeks gestation 06/19/2017  . Normal labor 06/19/2017  . SVD (spontaneous vaginal delivery) 06/19/2017  . Anemia in pregnancy 03/16/2017  . Vitamin D deficiency 11/29/2016  . Supervision of other normal pregnancy, antepartum 11/21/2016  . Language barrier 11/10/2015   Additional problems:      Discharge diagnosis: Term Pregnancy Delivered and PPH, resolved                                                                                               Post partum procedures:cytotec, methergine, manual sweep of uterus with removal of clots, IV gent/clinda  Complications: Intrauterine Inflammation or infection (Chorioamniotis) and Hemorrhage>1047mL, shoulder dystocia  Hospital course:  Onset of Labor With Vaginal Delivery  27 y.o. [id number] at [redacted]w[redacted]d was admitted in Active Labor on 06/19/2017. Patient had a labor course as follows:  Membrane Rupture Time/Date: 1:58 PM ,06/19/2017   Intrapartum Procedures: Episiotomy: None [1]                                         Lacerations:  None [1]  Patient had a 17m10s shoulder dystocia and had a delivery of a Viable infant. 06/19/2017  Information for the patient's newborn:  Abdallah, Girl Paytyn [[id number]]  Delivery Method: Vag-Spont   Pateint had a postpartum course notable for PPH of ~1548ml s/p cytotec, methergine, fundal massage, manual sweep with extraction of clots. She spiked a temperature of 101.17F 3/6 and managed on 24hrs broad spectrum antibiotics with Gent/Clindamycin with no further complications. Urine culture drawn but not resulted by the time of discharge, although  asymptomatic. She is ambulating, tolerating a regular diet, passing flatus, and urinating well. Patient is discharged home in stable condition on 06/22/17.  Physical exam  Vitals:   06/21/17 1726 06/22/17 0604  BP: 117/60 (!) 101/56  Pulse: (!) 117 87  Resp: 20 18  Temp: 99 F (37.2 C) 98.4 F (36.9 C)  SpO2: 100%    General: alert, cooperative and no distress CV: regular rate and rhythm Lungs: CTAB Lochia: appropriate Uterine Fundus: firm Incision: N/A DVT Evaluation: No evidence of DVT seen on physical exam.  Labs: Results for orders placed or performed during the hospital encounter of 06/19/17 (from the past 24 hour(s))  Urinalysis, Routine w reflex microscopic     Status: Abnormal   Collection Time: 06/21/17  2:00 PM  Result Value Ref Range   Color, Urine YELLOW YELLOW   APPearance HAZY (A) CLEAR   Specific Gravity, Urine 1.019 1.005 - 1.030   pH 5.0 5.0 - 8.0   Glucose, UA NEGATIVE NEGATIVE mg/dL   Hgb urine dipstick LARGE (A) NEGATIVE   Bilirubin Urine NEGATIVE  NEGATIVE   Ketones, ur NEGATIVE NEGATIVE mg/dL   Protein, ur 30 (A) NEGATIVE mg/dL   Nitrite NEGATIVE NEGATIVE   Leukocytes, UA LARGE (A) NEGATIVE   RBC / HPF TOO NUMEROUS TO COUNT 0 - 5 RBC/hpf   WBC, UA TOO NUMEROUS TO COUNT 0 - 5 WBC/hpf   Bacteria, UA RARE (A) NONE SEEN   Squamous Epithelial / LPF 0-5 (A) NONE SEEN   Mucus PRESENT    Discharge instruction: per After Visit Summary and "Baby and Me Booklet".  After visit meds:  No Known Allergies  Allergies as of 06/22/2017   No Known Allergies     Medication List    STOP taking these medications   CITRANATAL BLOOM 90-1 MG Tabs   COMFORT FIT MATERNITY SUPP SM Misc   omeprazole 20 MG capsule Commonly known as:  PRILOSEC   PRENATE PIXIE 10-0.6-0.4-200 MG Caps   Vitamin D (Ergocalciferol) 50000 units Caps capsule Commonly known as:  DRISDOL     TAKE these medications   ibuprofen 600 MG tablet Commonly known as:  ADVIL,MOTRIN Take 1  tablet (600 mg total) by mouth every 6 (six) hours.   VITAFOL ULTRA 29-0.6-0.4-200 MG Caps Take 1 tablet by mouth daily.      Diet: routine diet  Activity: Advance as tolerated. Pelvic rest for 6 weeks.   Outpatient follow up:4 weeks Future Appointments:  Future Appointments  Date Time Provider [place]  07/17/2017  3:45 PM Kandis Cocking A, CNM [facility] None    Follow up Appt: No Follow-up on file.  Postpartum contraception: Undecided  Newborn Data: APGAR (1 MIN): 8   APGAR (5 MINS): 9    Baby Feeding: Breast Disposition:home with mother  Rory Percy, DO 06/22/2017

## 2017-07-17 ENCOUNTER — Ambulatory Visit: Payer: Medicaid Other | Admitting: Certified Nurse Midwife

## 2017-07-17 ENCOUNTER — Encounter: Payer: Self-pay | Admitting: Certified Nurse Midwife

## 2017-07-17 ENCOUNTER — Ambulatory Visit (INDEPENDENT_AMBULATORY_CARE_PROVIDER_SITE_OTHER): Payer: Medicaid Other | Admitting: Certified Nurse Midwife

## 2017-07-17 DIAGNOSIS — IMO0002 Reserved for concepts with insufficient information to code with codable children: Secondary | ICD-10-CM

## 2017-07-17 DIAGNOSIS — R633 Feeding difficulties: Secondary | ICD-10-CM

## 2017-07-17 NOTE — Progress Notes (Signed)
Post Partum Exam  Peggy Savage is a 27 y.o. G5P2002 female who presents for a postpartum visit. She is 4 weeks postpartum following a spontaneous vaginal delivery. I have fully reviewed the prenatal and intrapartum course. The delivery was at [redacted]w[redacted]d gestational weeks.  Anesthesia: epidural. Postpartum course has been pt c/o low back pain since SVD. Baby's course has been unremarkable. Baby is feeding by both breast and bottle - she forgot formula name. . Bleeding spotting.. Bowel function is abnormal: constipation. . Bladder function is normal. Patient is sexually active. Contraception method is Declined BC at this time. Postpartum depression screening:neg. States will use condoms.  Discussed condom use and pregnancy rates.  Spouse does not want her to get an IUD.  Infant was 9 lbs at birth.  Reports cluster feeding with breast feeding and that baby is waking up every 10 minutes and breast feeding for 10 minutes then falling asleep.  Encouraged to wake baby up.  Baby needs to feed for a minimum of 15 minutes on each side.  Reports decreased breast milk supply from right breast.  Lactation consult highly encouraged.  Has not been pumping after feeds, encouraged to do so, so that she will not need to supplement and keep her breast milk supply.     The following portions of the patient's history were reviewed and updated as appropriate: allergies, current medications, past family history, past medical history, past social history, past surgical history and problem list. Last pap smear done 11/21/16 and was Normal  Review of Systems Pertinent items noted in HPI and remainder of comprehensive ROS otherwise negative.    Objective:  unknown if currently breastfeeding.  General:  alert, cooperative and no distress   Breasts:  inspection negative, no nipple discharge or bleeding, no masses or nodularity palpable  Lungs: clear to auscultation bilaterally  Heart:  regular rate and rhythm, S1, S2 normal, no  murmur, click, rub or gallop  Abdomen: soft, non-tender; bowel sounds normal; no masses,  no organomegaly  Pelvic/Rectal Exam: Not performed.        Assessment:    Normal 4 week postpartum exam. Pap smear not done at today's visit.   Non-english speaking patient: [place] video interpreter used.  Plan:   1. Contraception: abstinence and condoms 2. Declined IUD at this time.  Discussed breast feeding issues of cluster feeding.  Lactation consult offered.   3. Follow up in: 5 months for annual exam or as needed.

## 2017-07-18 ENCOUNTER — Encounter: Payer: Self-pay | Admitting: Certified Nurse Midwife

## 2017-09-21 ENCOUNTER — Encounter (HOSPITAL_COMMUNITY): Payer: Self-pay | Admitting: Family Medicine

## 2017-09-21 ENCOUNTER — Ambulatory Visit (HOSPITAL_COMMUNITY)
Admission: EM | Admit: 2017-09-21 | Discharge: 2017-09-21 | Disposition: A | Discharge status: Home / Self Care | Payer: Medicaid Other | Attending: Family Medicine | Admitting: Family Medicine

## 2017-09-21 DIAGNOSIS — Z3202 Encounter for pregnancy test, result negative: Secondary | ICD-10-CM

## 2017-09-21 DIAGNOSIS — R42 Dizziness and giddiness: Secondary | ICD-10-CM | POA: Insufficient documentation

## 2017-09-21 DIAGNOSIS — R51 Headache: Secondary | ICD-10-CM

## 2017-09-21 LAB — POCT I-STAT, CHEM 8
BUN: 18 mg/dL (ref 6–20)
CHLORIDE: 107 mmol/L (ref 101–111)
Calcium, Ion: 1.23 mmol/L (ref 1.15–1.40)
Creatinine, Ser: 0.5 mg/dL (ref 0.44–1.00)
GLUCOSE: 91 mg/dL (ref 65–99)
HEMATOCRIT: 40 % (ref 36.0–46.0)
Hemoglobin: 13.6 g/dL (ref 12.0–15.0)
POTASSIUM: 3.8 mmol/L (ref 3.5–5.1)
Sodium: 141 mmol/L (ref 135–145)
TCO2: 24 mmol/L (ref 22–32)

## 2017-09-21 LAB — POCT URINALYSIS DIP (DEVICE)
BILIRUBIN URINE: NEGATIVE
GLUCOSE, UA: NEGATIVE mg/dL
Hgb urine dipstick: NEGATIVE
KETONES UR: NEGATIVE mg/dL
LEUKOCYTES UA: NEGATIVE
Nitrite: NEGATIVE
PROTEIN: NEGATIVE mg/dL
Specific Gravity, Urine: >=1.03 (ref 1.005–1.030)
Urobilinogen, UA: 0.2 mg/dL (ref 0.0–1.0)
pH: 5 (ref 5.0–8.0)

## 2017-09-21 LAB — POCT PREGNANCY, URINE: Preg Test, Ur: NEGATIVE

## 2017-09-21 MED ORDER — VITAFOL ULTRA 29-0.6-0.4-200 MG PO CAPS: 1.0000 | ORAL_CAPSULE | Freq: Every day | ORAL | 12 refills | Status: DC

## 2017-09-21 MED ORDER — IBUPROFEN 800 MG PO TABS: 800.0000 mg | ORAL_TABLET | Freq: Three times a day (TID) | ORAL | 0 refills | Status: DC

## 2017-09-21 MED ORDER — FLUTICASONE PROPIONATE 50 MCG/ACT NA SUSP: 1.0000 | Freq: Every day | NASAL | 2 refills | Status: DC

## 2017-09-21 NOTE — ED Triage Notes (Addendum)
Pt with hx of anemia and vitamin D deficiency here for intermittent dizziness over the past few weeks. Reports that her head feels heavy. Denies any bleeding. Recent pregnancy in march. She reports period 4 days and light bleeding.

## 2017-09-21 NOTE — Discharge Instructions (Addendum)
Please increase fluid and calorie intake while you are breastfeeding, you can easily become dehydrated.  Daily multivitamin.  We will call if you vitamin d level is abnormal. Please start daily flonase as this may help with sinuses. Ibuprofen for pain control, take with food.  Please follow up with your primary care provider if no improvement, go to ER if any worsening of symptoms.

## 2017-09-21 NOTE — ED Provider Notes (Signed)
[facility]    CSN: [id number] Arrival date & time: 09/21/17  1731     History   Chief Complaint Chief Complaint  Patient presents with  . Dizziness    HPI Peggy Savage is a 27 y.o. female.   [name] presents with complaints of dizziness which comes and goes, worse tonight. Started 5/20 when she started fasting for Ramadan. She has since discontinued fasting and has been eating. Does state she does not drink much water. Headache to posterior head, pain 6/10. Denies nausea or vomiting. Dizziness is worse with activity. States feels similar to episode a year ago, had a CT and sinusitis was found and was placed on antibiotics which helped. Has been taking medication for headache which has not helped. She is breastfeed, son is 3 months. LMP 6/2 and was normal for her. States hx of anemia as well as vit d deficiency. Is not taking a multivitamin. Denies cough or congestion. Has not injured or head or passed out.    ROS per HPI.      Past Medical History:  Diagnosis Date  . Anemia   . Vitamin D deficiency     Patient Active Problem List   Diagnosis Date Noted  . Post term pregnancy at [redacted] weeks gestation 06/19/2017  . Normal labor 06/19/2017  . SVD (spontaneous vaginal delivery) 06/19/2017  . Anemia in pregnancy 03/16/2017  . Vitamin D deficiency 11/29/2016  . Supervision of other normal pregnancy, antepartum 11/21/2016  . Language barrier 11/10/2015    Past Surgical History:  Procedure Laterality Date  . NO PAST SURGERIES      OB History    Gravida  2   Para  2   Term  2   Preterm  0   AB  0   Living  2     SAB  0   TAB  0   Ectopic  0   Multiple  0   Live Births  2            Home Medications    Prior to Admission medications   Medication Sig Start Date End Date Taking? Authorizing Provider  fluticasone (FLONASE) 50 MCG/ACT nasal spray Place 1 spray into both nostrils daily. 09/21/17   Zigmund Gottron, NP  ibuprofen  (ADVIL,MOTRIN) 800 MG tablet Take 1 tablet (800 mg total) by mouth 3 (three) times daily. 09/21/17   Zigmund Gottron, NP  Prenat-Fe Poly-Methfol-FA-DHA (VITAFOL ULTRA) 29-0.6-0.4-200 MG CAPS Take 1 tablet by mouth daily. 09/21/17   Zigmund Gottron, NP    Family History Family History  Problem Relation Age of Onset  . Diabetes Mother   . Hypertension Mother   . Diabetes Father   . Hypertension Father     Social History Social History   Tobacco Use  . Smoking status: Never Smoker  . Smokeless tobacco: Never Used  Substance Use Topics  . Alcohol use: No  . Drug use: No     Allergies   Patient has no known allergies.   Review of Systems Review of Systems   Physical Exam Triage Vital Signs ED Triage Vitals  Enc Vitals Group     BP 09/21/17 1809 122/76     Pulse Rate 09/21/17 1809 74     Resp 09/21/17 1809 18     Temp 09/21/17 1809 98.7 F (37.1 C)     Temp src --      SpO2 09/21/17 1809 100 %     Weight --  Height --      Head Circumference --      Peak Flow --      Pain Score 09/21/17 1807 3     Pain Loc --      Pain Edu? --      Excl. in [place]? --    No data found.  Updated Vital Signs BP 122/76   Pulse 74   Temp 98.7 F (37.1 C)   Resp 18   LMP 09/17/2017   SpO2 100%   Visual Acuity Right Eye Distance:   Left Eye Distance:   Bilateral Distance:    Right Eye Near:   Left Eye Near:    Bilateral Near:     Physical Exam  Constitutional: She is oriented to person, place, and time. She appears well-developed and well-nourished. No distress.  HENT:  Head: Normocephalic and atraumatic.  Right Ear: Tympanic membrane, external ear and ear canal normal.  Left Ear: Tympanic membrane, external ear and ear canal normal.  Nose: Nose normal.  Mouth/Throat: Uvula is midline, oropharynx is clear and moist and mucous membranes are normal. No tonsillar exudate.  Eyes: Pupils are equal, round, and reactive to light. Conjunctivae and EOM are normal.    Cardiovascular: Normal rate, regular rhythm and normal heart sounds.  Pulmonary/Chest: Effort normal and breath sounds normal.  Musculoskeletal: Normal range of motion.  Neurological: She is alert and oriented to person, place, and time. No cranial nerve deficit or sensory deficit. She displays a negative Romberg sign. Coordination normal.  Skin: Skin is warm and dry.     UC Treatments / Results  Labs (all labs ordered are listed, but only abnormal results are displayed) Labs Reviewed  VITAMIN D 25 HYDROXY (VIT D DEFICIENCY, FRACTURES)  POCT I-STAT, CHEM 8  POCT URINALYSIS DIP (DEVICE)  POCT PREGNANCY, URINE    EKG None  Radiology No results found.  Procedures Procedures (including critical care time)  Medications Ordered in UC Medications - No data to display  Initial Impression / Assessment and Plan / UC Course  I have reviewed the triage vital signs and the nursing notes.  Pertinent labs & imaging results that were available during my care of the patient were reviewed by me and considered in my medical decision making (see chart for details).     Chem 8 reassuring. Non toxic in appearance. Ambulatory without difficulty. Without neurological findings. Ibuprofen for headache. Will try flonase daily with sinuses as this attributed in the past. Increase fluid intake. Vit d level pending. May start a multivitamin as this may help generally. Return precautions provided. Patient verbalized understanding and agreeable to plan.    Final Clinical Impressions(s) / UC Diagnoses   Final diagnoses:  Dizziness     Discharge Instructions     Please increase fluid and calorie intake while you are breastfeeding, you can easily become dehydrated.  Daily multivitamin.  We will call if you vitamin d level is abnormal. Please start daily flonase as this may help with sinuses. Ibuprofen for pain control, take with food.  Please follow up with your primary care provider if no  improvement, go to ER if any worsening of symptoms.     ED Prescriptions    Medication Sig Dispense Auth. Provider   Prenat-Fe Poly-Methfol-FA-DHA (VITAFOL ULTRA) 29-0.6-0.4-200 MG CAPS Take 1 tablet by mouth daily. 30 capsule Augusto Gamble B, NP   ibuprofen (ADVIL,MOTRIN) 800 MG tablet Take 1 tablet (800 mg total) by mouth 3 (three) times daily. 21 tablet  Zigmund Gottron, NP   fluticasone (FLONASE) 50 MCG/ACT nasal spray Place 1 spray into both nostrils daily. 16 g Zigmund Gottron, NP     Controlled Substance Prescriptions  Controlled Substance Registry consulted? Not Applicable   Zigmund Gottron, NP 09/21/17 [phone]

## 2017-09-23 LAB — VITAMIN D 25 HYDROXY (VIT D DEFICIENCY, FRACTURES): VIT D 25 HYDROXY: 20.4 ng/mL — AB (ref 30.0–100.0)

## 2017-09-25 ENCOUNTER — Telehealth (HOSPITAL_COMMUNITY): Payer: Self-pay

## 2017-09-25 NOTE — Telephone Encounter (Signed)
Attempted to reach patient to instruct on low vitamin d levels and to educate to take iron supplement.  No answer.

## 2018-01-15 IMAGING — CT CT HEAD W/O CM
3 of 5 series · 14 of 47 positions shown, 16 images · non-contrast
Comparison: No comparison.

CLINICAL DATA: One month history of headache.

EXAM:
CT HEAD WITHOUT CONTRAST
TECHNIQUE: Contiguous axial images were obtained from the base of the skull
through the vertex without intravenous contrast.

[Series 4: head 2mm · axial · 0.38mm/px · z∈[+1217,+1336]mm · 8 of 82 slices shown, 10 images]
[im 8/82  brain]
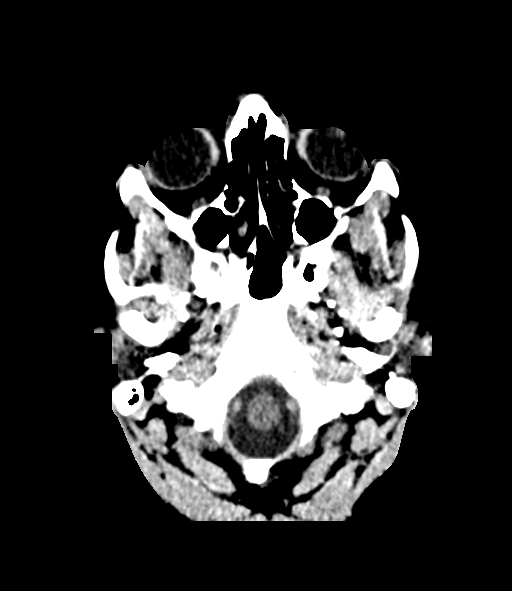
[im 8/82  bone]
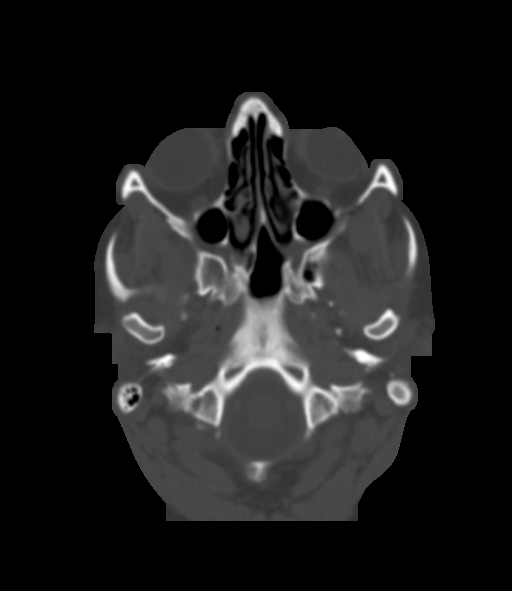
[im 15/82  brain]
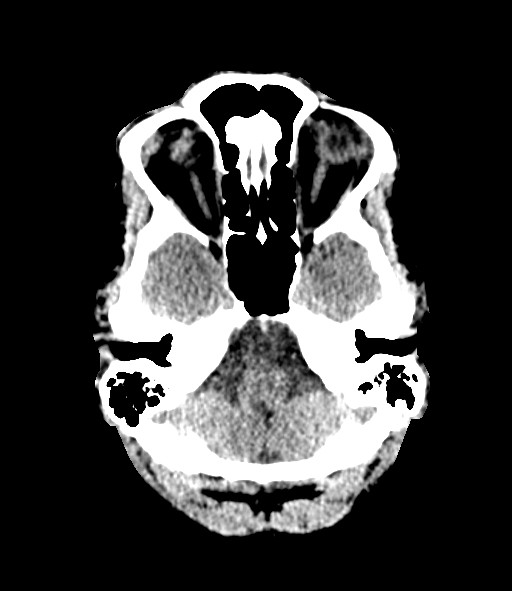
[im 30/82  brain]
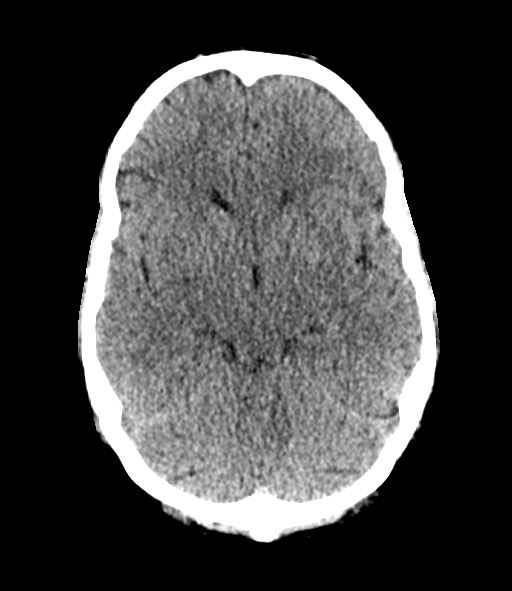
[im 37/82  brain]
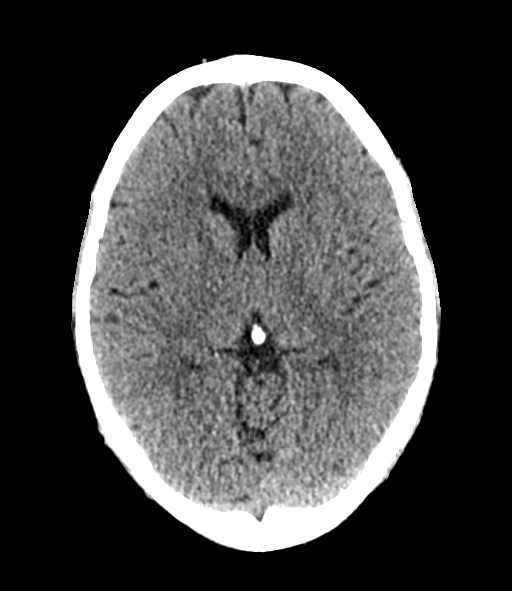
[im 45/82  brain]
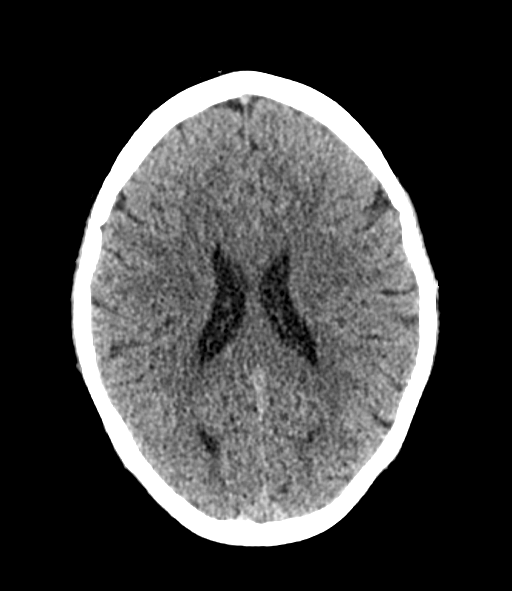
[im 45/82  bone]
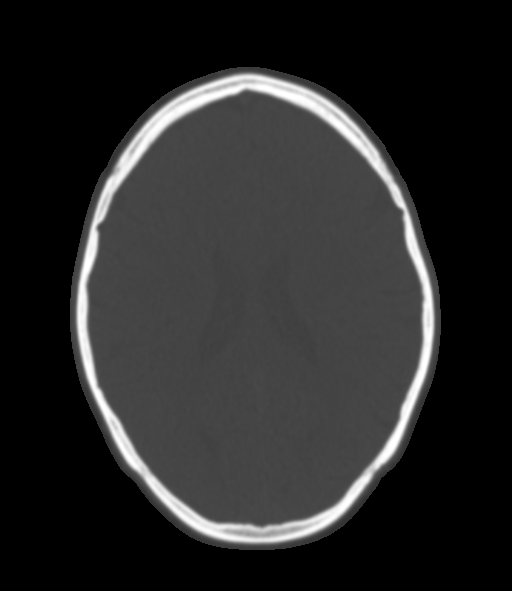
[im 52/82  brain]
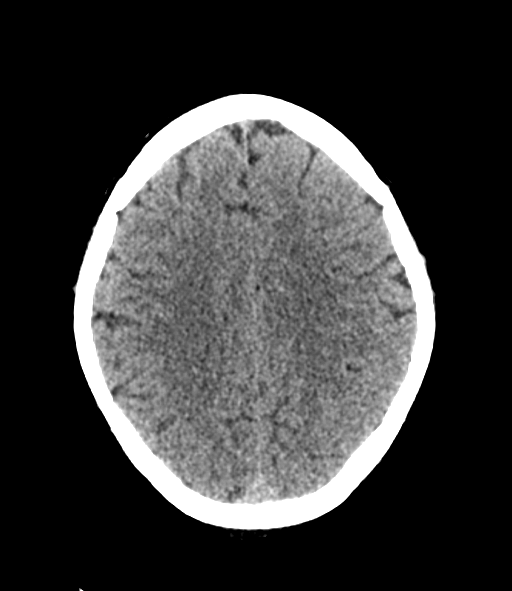
[im 67/82  brain]
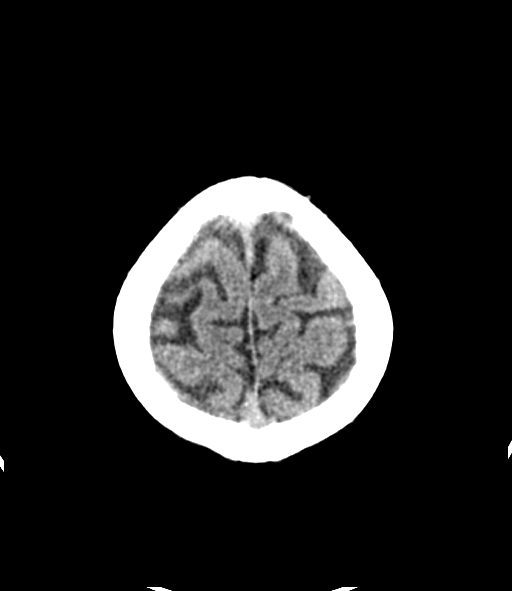
[im 74/82  brain]
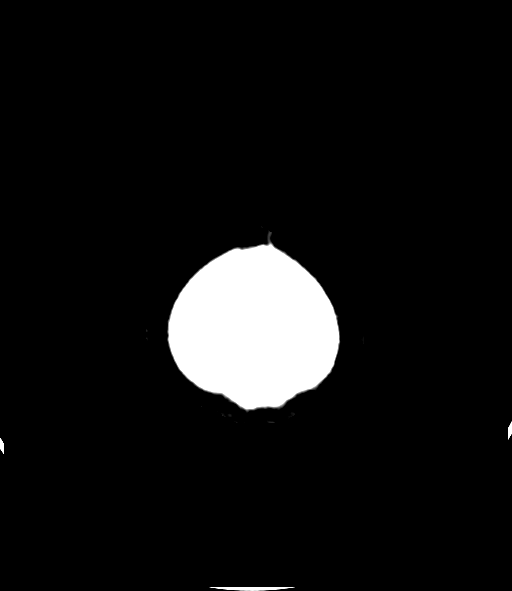

[Series 5: head without cor · coronal · non-contrast · 0.32mm/px · 3 of 70 slices shown]
[im 25/70  brain]
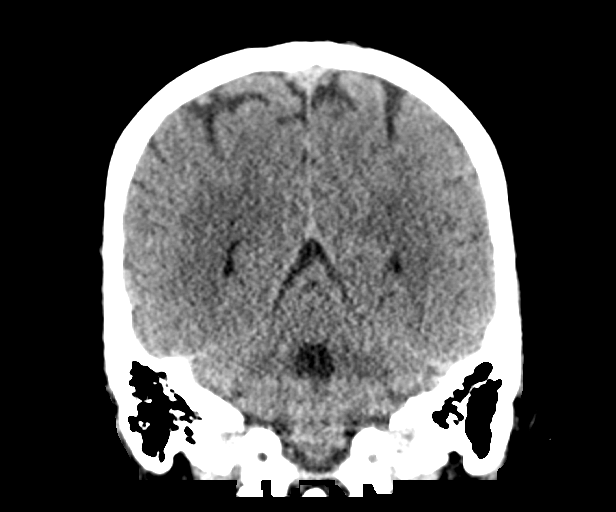
[im 32/70  brain]
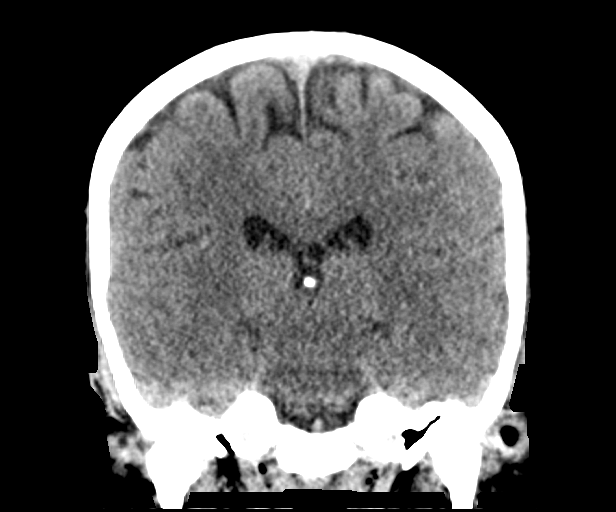
[im 38/70  brain]
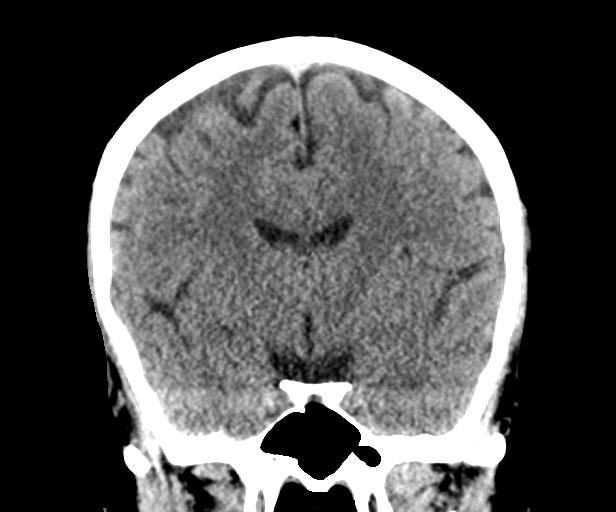

[Series 6: head without sag · sagittal · non-contrast · 0.32mm/px · 3 of 53 slices shown]
[im 18/53  brain]
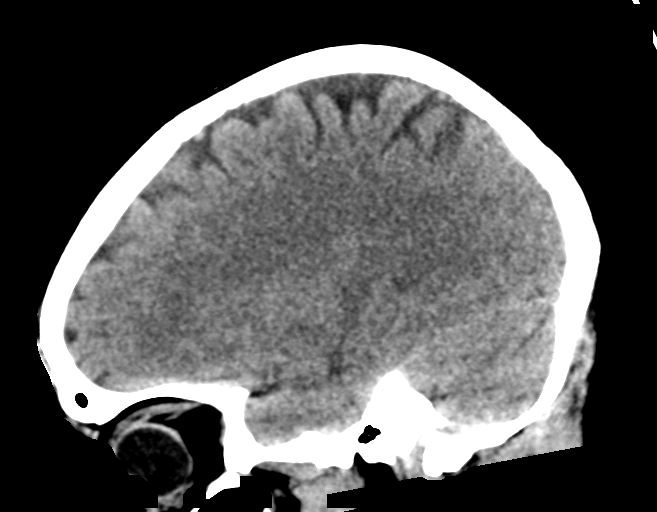
[im 27/53  brain]
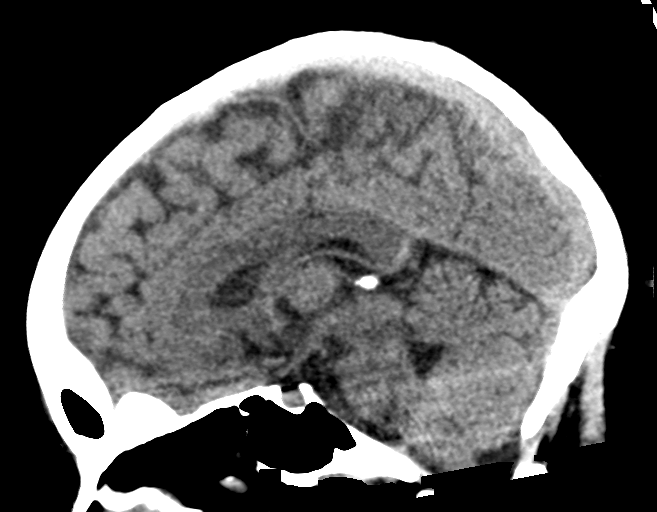
[im 35/53  brain]
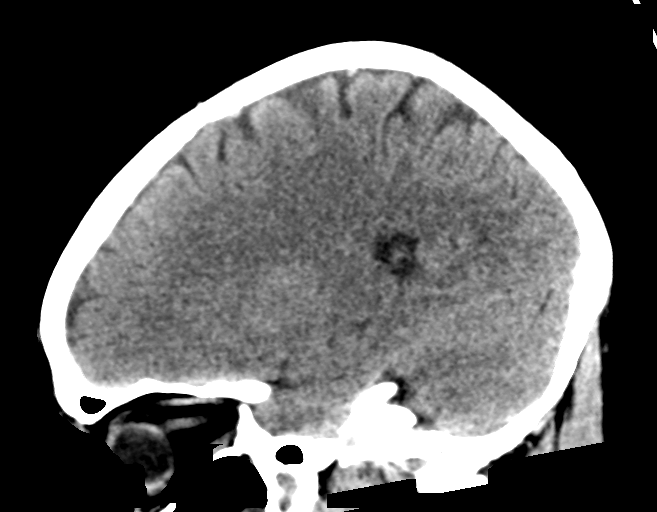

[14 of 47 positions shown; findings below may reference images not displayed]

FINDINGS: Brain: There is no evidence for acute hemorrhage, hydrocephalus,
mass lesion, or abnormal extra-axial fluid collection. No definite
CT evidence for acute infarction.

Vascular: No hyperdense vessel or unexpected calcification.

Skull: No evidence for fracture. No worrisome lytic or sclerotic
lesion.

Sinuses/Orbits: Chronic polypoid mucosal disease noted left
maxillary sinus. Remaining visualized paranasal sinuses are clear.
Visualized portions of the globes and intraorbital fat are
unremarkable.

Other: None.
IMPRESSION: Chronic left maxillary sinus disease.  Otherwise normal study.

## 2018-06-01 ENCOUNTER — Other Ambulatory Visit: Payer: Self-pay

## 2018-06-01 ENCOUNTER — Emergency Department (HOSPITAL_BASED_OUTPATIENT_CLINIC_OR_DEPARTMENT_OTHER)
Admission: EM | Admit: 2018-06-01 | Discharge: 2018-06-01 | Disposition: A | Discharge status: Home / Self Care | Payer: Medicaid Other | Attending: Emergency Medicine | Admitting: Emergency Medicine

## 2018-06-01 ENCOUNTER — Encounter (HOSPITAL_BASED_OUTPATIENT_CLINIC_OR_DEPARTMENT_OTHER): Payer: Self-pay

## 2018-06-01 DIAGNOSIS — K047 Periapical abscess without sinus: Secondary | ICD-10-CM

## 2018-06-01 DIAGNOSIS — Z79899 Other long term (current) drug therapy: Secondary | ICD-10-CM | POA: Insufficient documentation

## 2018-06-01 DIAGNOSIS — M542 Cervicalgia: Secondary | ICD-10-CM | POA: Insufficient documentation

## 2018-06-01 MED ORDER — LIDOCAINE HCL (PF) 1 % IJ SOLN
5.0000 mL | Freq: Once | INTRAMUSCULAR | Status: DC
  Filled 2018-06-01: qty 5

## 2018-06-01 MED ORDER — PENICILLIN V POTASSIUM 500 MG PO TABS: 500.0000 mg | ORAL_TABLET | Freq: Three times a day (TID) | ORAL | 0 refills | Status: AC

## 2018-06-01 NOTE — Discharge Instructions (Addendum)
Evaluated today for dental pain and neck pain.  You do have periapical abscess which was drained in the emergency department.  I have given you prescription for antibiotics.  You may take naproxen or ibuprofen as needed for pain.  Also recommend anti-inflammatory such as ibuprofen for your neck pain.  I would also recommend placing heat to this area.  Follow-up with PCP for reevaluation.  Return to the ED for any worsening symptoms.

## 2018-06-01 NOTE — ED Triage Notes (Signed)
Arabic interpreter line used Peggy Savage [phone] c/o right lower toothache, swelling and pus-sx started 1 week-swelling to right side of neck x today-pain to posterior neck x 2 months-denies injury-lightheaded x today-pt NAD-steady gait

## 2018-06-01 NOTE — ED Provider Notes (Signed)
[facility] Provider Note   CSN: [id number] Arrival date & time: 06/01/18  1723  History   Chief Complaint Chief Complaint  Patient presents with  . Oral Swelling    HPI Peggy Savage is a 28 y.o. female with past medical history who presents for evaluation of right-sided dental pain and facial swelling.  Patient states she has had right-sided dental pain x1 week.  Patient states when she woke up this morning she noticed some mild swelling to her right jaw.  States she does have known cavities as well as a known broken tooth to her right lower side.  Patient states she called her dentist and they were not able to get her in until the end of next week.  She rates her pain a 5/10.  Pain does not radiate.  Patient states she has also had pain located to her bilateral trapezius muscles x4 months.  Pain is intermittent in nature.  Patient states her pain tends to increase when she picks up her 6-month-old up frequently.  Denies any injuries or trauma. Denies fever, chills, nausea, vomiting, drooling, dysphasia, trismus, midline cervical pain, decreased range of motion in her upper extremities, numbness and tingling in her extremities. No recent MVC or chiropractor adjustments. Tetanus up to date.  History obtained from patient.  Medical Arabic interpreter was used.  HPI  Past Medical History:  Diagnosis Date  . Anemia   . Vitamin D deficiency     Patient Active Problem List   Diagnosis Date Noted  . Post term pregnancy at [redacted] weeks gestation 06/19/2017  . Normal labor 06/19/2017  . SVD (spontaneous vaginal delivery) 06/19/2017  . Anemia in pregnancy 03/16/2017  . Vitamin D deficiency 11/29/2016  . Supervision of other normal pregnancy, antepartum 11/21/2016  . Language barrier 11/10/2015    Past Surgical History:  Procedure Laterality Date  . NO PAST SURGERIES       OB History    Gravida  2   Para  2   Term  2   Preterm  0   AB  0   Living  2     SAB  0   TAB  0   Ectopic  0   Multiple  0   Live Births  2            Home Medications    Prior to Admission medications   Medication Sig Start Date End Date Taking? Authorizing Provider  fluticasone (FLONASE) 50 MCG/ACT nasal spray Place 1 spray into both nostrils daily. 09/21/17   Zigmund Gottron, NP  ibuprofen (ADVIL,MOTRIN) 800 MG tablet Take 1 tablet (800 mg total) by mouth 3 (three) times daily. 09/21/17   Zigmund Gottron, NP  penicillin v potassium (VEETID) 500 MG tablet Take 1 tablet (500 mg total) by mouth 3 (three) times daily for 5 days. 06/01/18 06/06/18  Meribeth Vitug A, PA-C  Prenat-Fe Poly-Methfol-FA-DHA (VITAFOL ULTRA) 29-0.6-0.4-200 MG CAPS Take 1 tablet by mouth daily. 09/21/17   Zigmund Gottron, NP    Family History Family History  Problem Relation Age of Onset  . Diabetes Mother   . Hypertension Mother   . Diabetes Father   . Hypertension Father     Social History Social History   Tobacco Use  . Smoking status: Never Smoker  . Smokeless tobacco: Never Used  Substance Use Topics  . Alcohol use: No  . Drug use: No     Allergies   Patient has  no known allergies.   Review of Systems Review of Systems  Constitutional: Negative.   HENT: Positive for dental problem and facial swelling. Negative for congestion, drooling, ear pain, hearing loss, mouth sores, nosebleeds, postnasal drip, rhinorrhea, sinus pressure, sinus pain, sneezing, sore throat, tinnitus, trouble swallowing and voice change.   Eyes: Negative.   Respiratory: Negative.   Cardiovascular: Negative.   Gastrointestinal: Negative.   Genitourinary: Negative.   Musculoskeletal: Positive for neck pain. Negative for arthralgias, back pain, gait problem, joint swelling, myalgias and neck stiffness.  Skin: Negative.   Neurological: Negative.   All other systems reviewed and are negative.    Physical Exam Updated Vital Signs BP 115/61 (BP Location: Right Arm)    Pulse 70   Temp 98.2 F (36.8 C) (Oral)   Resp 18   Wt 100.2 kg   LMP 05/26/2018   SpO2 100%   BMI 35.67 kg/m   Physical Exam Vitals signs and nursing note reviewed.  Constitutional:      General: She is not in acute distress.    Appearance: She is well-developed. She is not ill-appearing, toxic-appearing or diaphoretic.     Comments: Patient drinking water initial evaluation.  No acute distress.  HENT:     Head: Normocephalic and atraumatic.     Jaw: There is normal jaw occlusion.     Comments: No Drooling, dysphasia or trismus.    Nose:     Right Sinus: No maxillary sinus tenderness or frontal sinus tenderness.     Left Sinus: No maxillary sinus tenderness or frontal sinus tenderness.     Mouth/Throat:     Lips: Pink.     Mouth: Mucous membranes are moist.     Dentition: Abnormal dentition. Dental tenderness, dental caries and dental abscesses present. No gingival swelling.     Pharynx: Oropharynx is clear. Uvula midline. No pharyngeal swelling, oropharyngeal exudate or posterior oropharyngeal erythema.     Tonsils: No tonsillar exudate or tonsillar abscesses. Swelling: 0 on the right. 0 on the left.      Comments: Tenderness to palpation over tooth #31.  Patient does have cavities and a fractured tooth present.  Mild gingival erythema with periapical abscess to tooth #31.  Mucous membranes moist.  Uvula midline without deviation. Posterior oropharynx without erythema.  No tonsillar edema or exudate. Eyes:     Pupils: Pupils are equal, round, and reactive to light.  Neck:     Musculoskeletal: Full passive range of motion without pain and normal range of motion. Normal range of motion. No edema, erythema, neck rigidity, crepitus, injury, pain with movement, torticollis, spinous process tenderness or muscular tenderness.     Trachea: Trachea and phonation normal.     Meningeal: Brudzinski's sign and Kernig's sign absent.      Comments: No neck stiffness or neck rigidity.  Tenderness to palpation over bilateral trapezius muscles.  No midline cervical tenderness.  Phonation normal. No submandibular swelling or erythema. No skin brawning. Cardiovascular:     Rate and Rhythm: Normal rate.     Pulses: Normal pulses.     Heart sounds: Normal heart sounds.  Pulmonary:     Effort: No respiratory distress.     Comments: Clear to auscultation bilaterally without wheeze, rhonchi or rales.  No assessory muscle usage.  No tachypnea. Abdominal:     General: There is no distension.     Comments: Soft, Nontender without rebound or guarding.  Musculoskeletal: Normal range of motion.     Right shoulder: Normal.  Left shoulder: Normal.     Cervical back: Normal. She exhibits normal range of motion, no tenderness, no bony tenderness, no swelling, no edema, no deformity, no laceration, no pain, no spasm and normal pulse.     Comments: Full range of motion of the T-spine and L-spine with flexion, hyperextension, and lateral flexion. No midline tenderness or stepoffs. No tenderness to palpation of the spinous processes of the T-spine or L-spine. No tenderness to palpation of the paraspinous muscles of the L-spine.  Lymphadenopathy:     Cervical: No cervical adenopathy.  Skin:    General: Skin is warm and dry.     Comments: No rashes or lesions  Neurological:     Mental Status: She is alert.     Deep Tendon Reflexes: Reflexes are normal and symmetric.     Comments: 5/5 strength to bilateral upper extremities. Intact sensation to bilateral upper extremities.      ED Treatments / Results  Labs (all labs ordered are listed, but only abnormal results are displayed) Labs Reviewed - No data to display  EKG None  Radiology No results found.  Procedures .Marland KitchenIncision and Drainage Date/Time: 06/01/2018 6:32 PM Performed by: Nettie Elm, PA-C Authorized by: Nettie Elm, PA-C   Consent:    Consent obtained:  Verbal   Consent given by:  Patient   Risks  discussed:  Bleeding, incomplete drainage, pain and damage to other organs   Alternatives discussed:  No treatment Universal protocol:    Procedure explained and questions answered to patient or proxy's satisfaction: yes     Relevant documents present and verified: yes     Test results available and properly labeled: yes     Imaging studies available: yes     Required blood products, implants, devices, and special equipment available: yes     Site/side marked: yes     Immediately prior to procedure a time out was called: yes     Patient identity confirmed:  Verbally with patient Location:    Type:  Abscess   Location:  Mouth   Mouth location: periapical abscess. Anesthesia (see MAR for exact dosages):    Anesthesia method:  Local infiltration   Local anesthetic:  Lidocaine 1% WITH epi Procedure type:    Complexity:  Simple Procedure details:    Incision types:  Single straight   Incision depth:  Subcutaneous   Scalpel blade:  11   Wound management:  Probed and deloculated   Drainage:  Purulent   Drainage amount:  Scant   Wound treatment:  Wound left open   Packing materials:  None Post-procedure details:    Patient tolerance of procedure:  Tolerated well, no immediate complications   (including critical care time)  Medications Ordered in ED Medications  lidocaine (PF) (XYLOCAINE) 1 % injection 5 mL (has no administration in time range)   Initial Impression / Assessment and Plan / ED Course  I have reviewed the triage vital signs and the nursing notes.  Pertinent labs & imaging results that were available during my care of the patient were reviewed by me and considered in my medical decision making (see chart for details).  28 year old female who appears otherwise well presents for evaluation of dental pain as well as neck pain.  Afebrile, nonseptic, non-ill-appearing.  Dental pain x1 week.  Tenderness palpation over tooth #31 with small periapical abscess.  No evidence of  appreciable facial swelling or neck swelling on exam.  No submandibular swelling, facial brawning or erythema.  No drooling or dysphasia.  No trismus.  Able to tolerate p.o. intake in department without difficulty.  Exam non-concerning for Ludwig's angina or spread of infection. Will I&D abscess and treat with penicillin and anti-inflammatories. Small periapical abscess drained without difficulty.  See procedure note.    Neck pain x 3 months. Tenderness to palpation of bilateral trapezius muscles.  No midline cervical neck pain.  Normal musculoskeletal exam to cervical neck and bilateral upper extremities.  Neurovascularly intact.  No known recent trauma, recent MVC or chiropractic adjustments.  Exam consistent with musculoskeletal pain.  Will DC home with anti-inflammatories and have patient follow-up with PCP for reevaluation.  Low suspicion for acute emergent pathology causing patient's symptoms at this time.  Patient hemodynamically stable and appropriate for DC home at this time. Will treat periapical abscess with penicillin and anti-inflammatories medicine.  Urged patient to follow-up with dentist.  Discussed return precautions.  Patient voiced understanding and is agreeable for follow-up.   Final Clinical Impressions(s) / ED Diagnoses   Final diagnoses:  Periapical abscess  Neck pain    ED Discharge Orders         Ordered    penicillin v potassium (VEETID) 500 MG tablet  3 times daily     06/01/18 1835           Yailene Badia A, PA-C 06/01/18 Highwood, Dan, DO 06/01/18 2009

## 2018-06-21 IMAGING — US US TRANSVAGINAL NON-OB
1 series · 15 of 25 positions shown · non-contrast
Comparison: None

CLINICAL DATA: Amenorrhea



[Series 1: us transvaginal non-ob · 77 acquisitions, 15 frames shown]
[im 1/77]
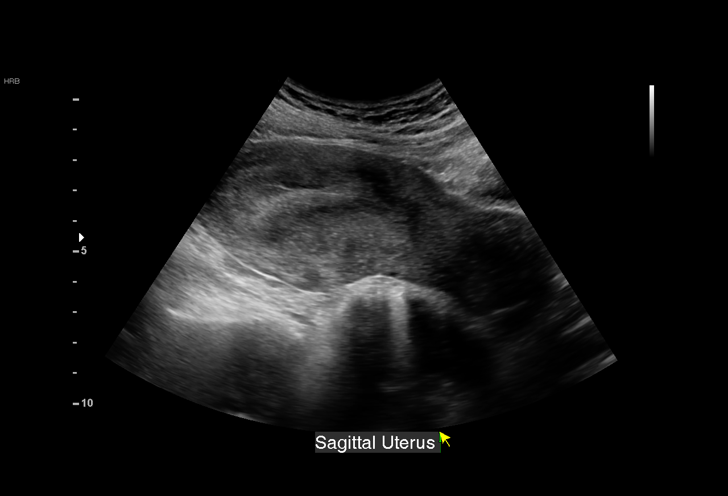
[im 7/77]
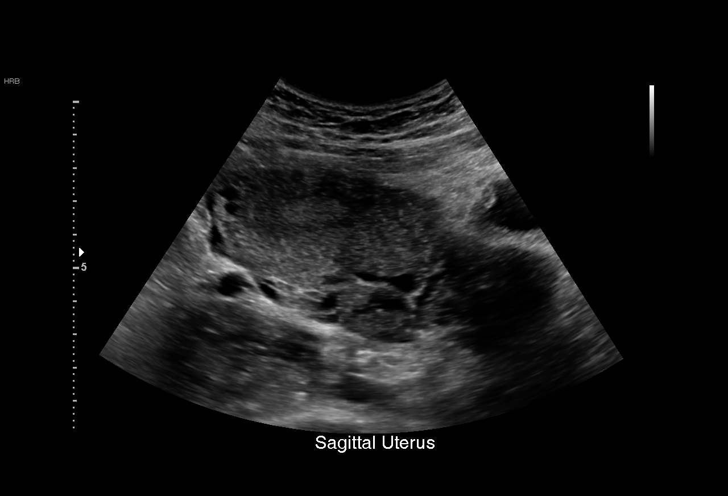
[im 13/77]
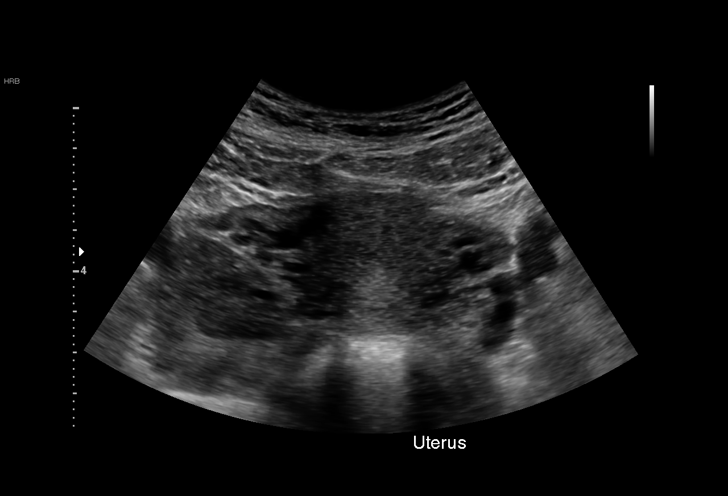
[im 16/77]
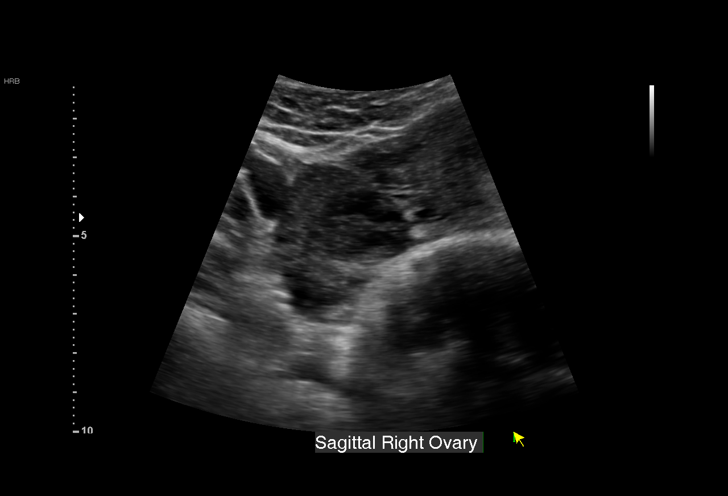
[im 23/77]
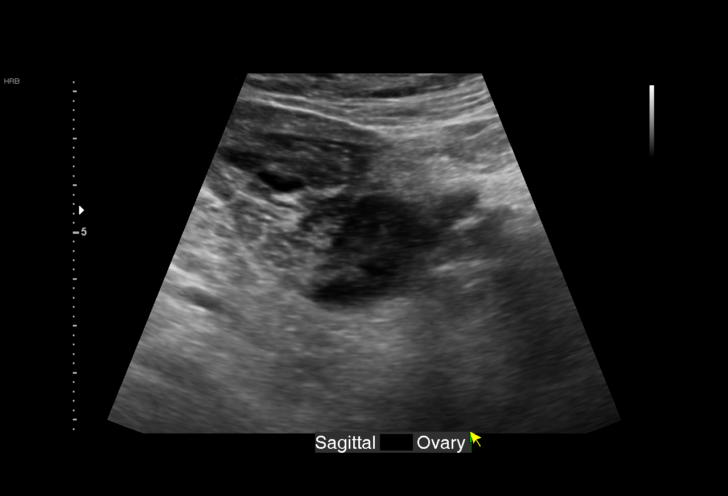
[im 29/77]
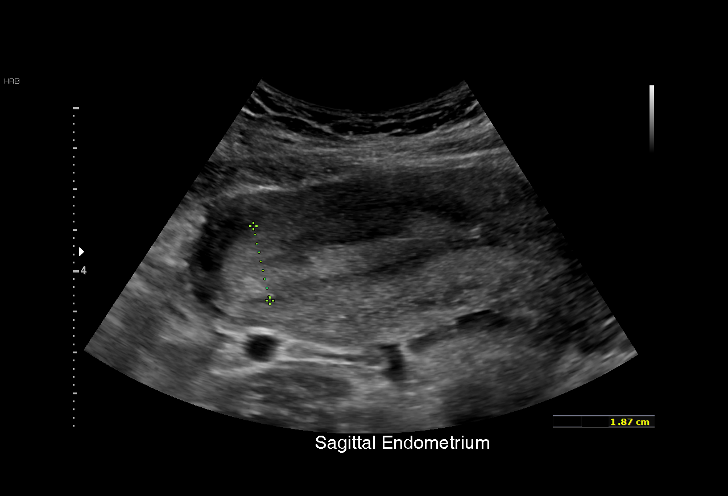
[im 32/77]
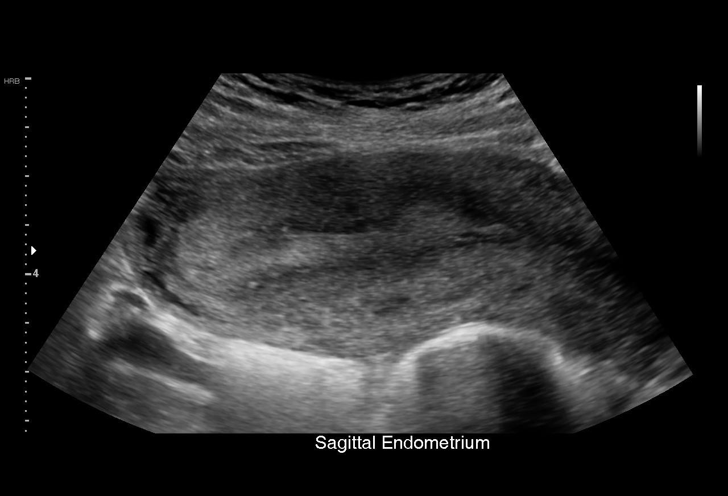
[im 39/77]
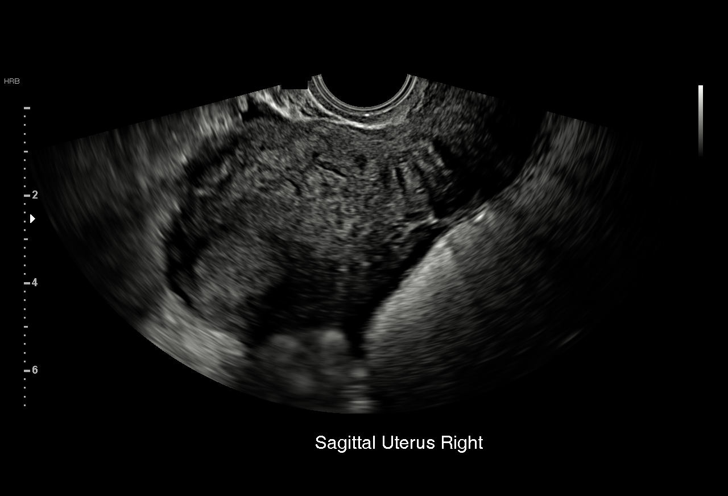
[im 45/77]
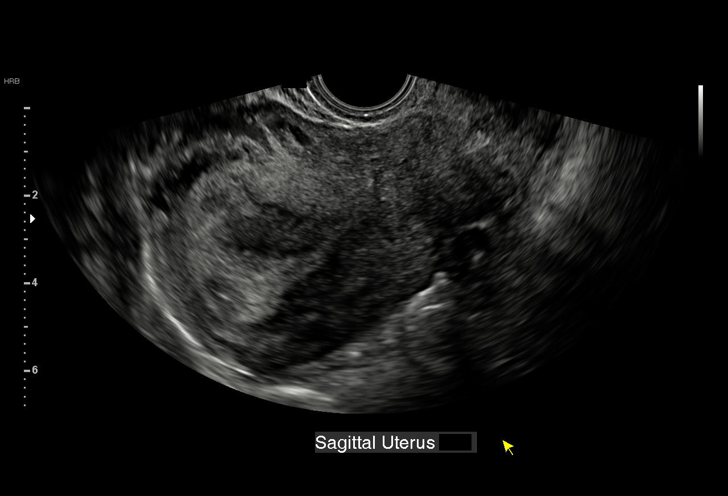
[im 48/77]
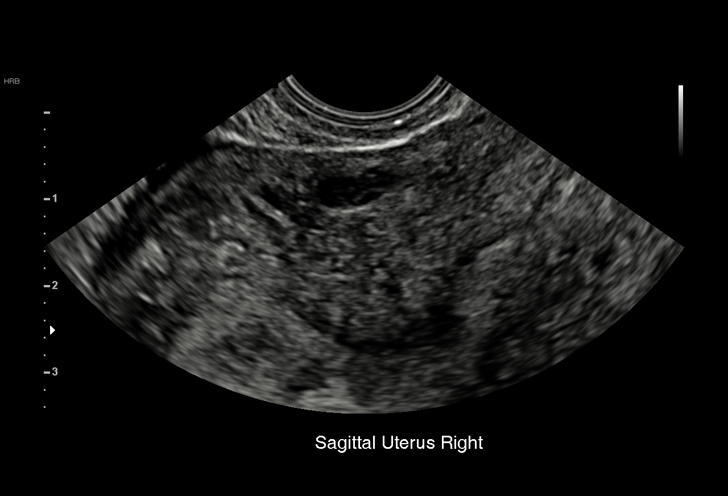
[im 54/77]
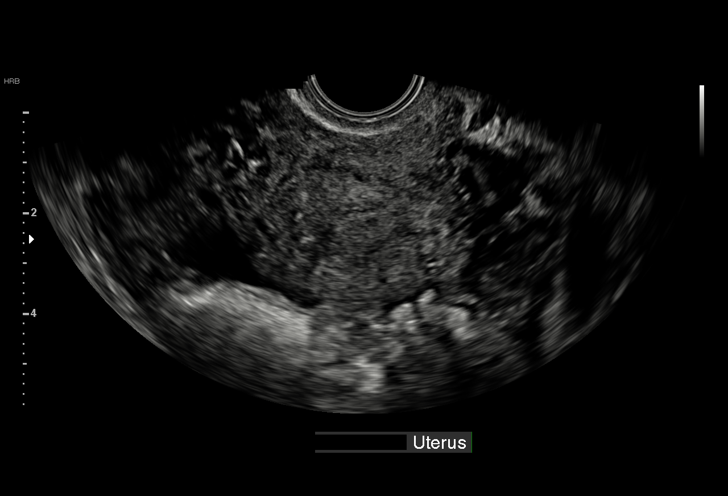
[im 61/77]
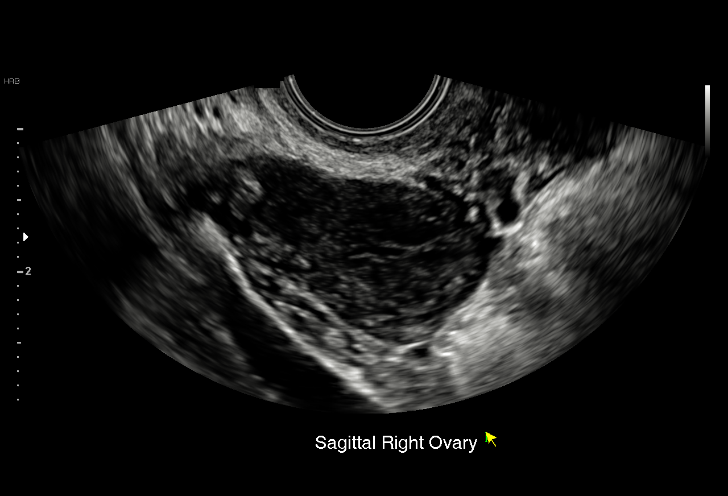
[im 64/77]
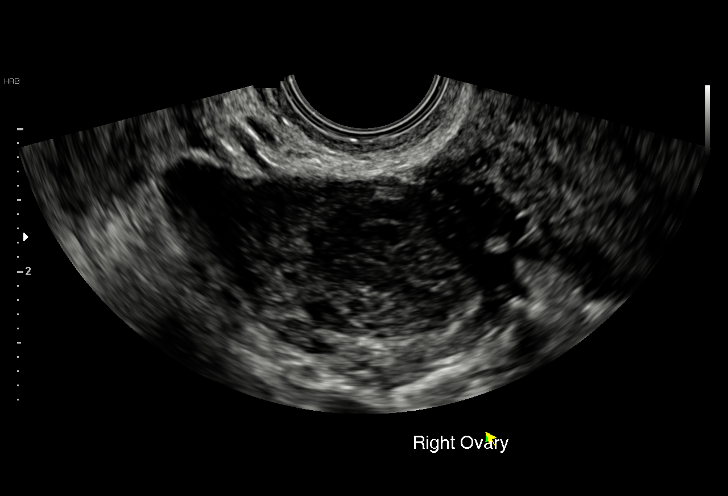
[im 70/77]
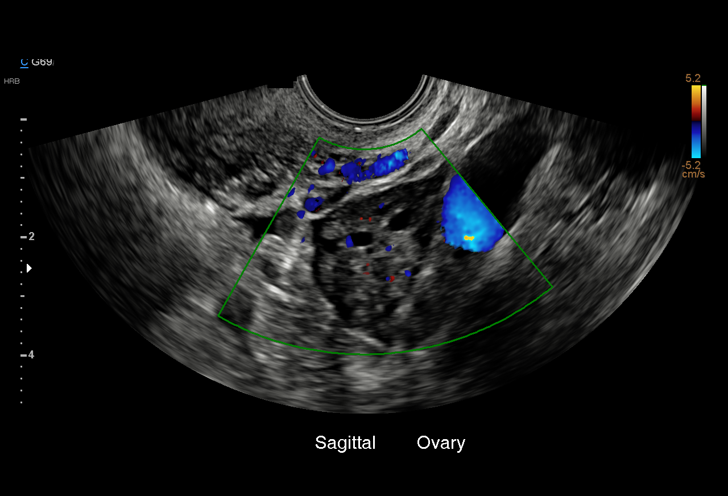
[im 77/77]
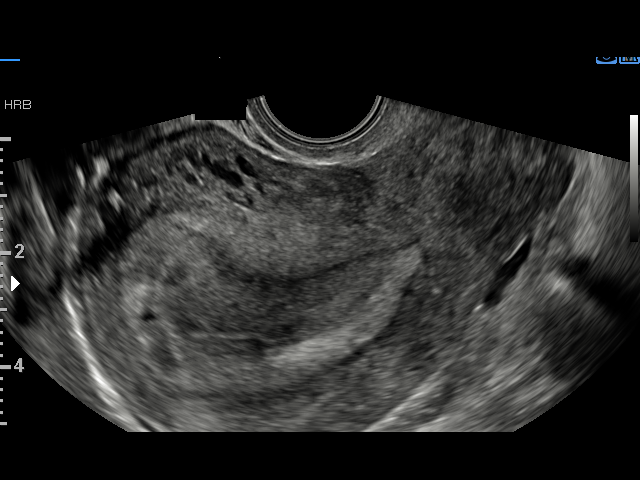

[15 of 25 positions shown; findings below may reference images not displayed]

FINDINGS: Uterus

Measurements: 10.8 x 3.8 x 7.3 cm. Small anterior subserosal fibroid
measures 9 mm.. No fibroids or other mass visualized.

Endometrium

Thickness: Thickened and heterogeneous with vascular components,
measuring up to 39 mm in thickness.

Right ovary

Measurements: 3.7 x 2.2 x 2.7 cm. Normal appearance/no adnexal mass.

Left ovary

Measurements: 2.9 x 1.8 x 2.2 cm. Normal appearance/no adnexal mass.

Other findings

No abnormal free fluid.
IMPRESSION: Markedly thickened, heterogeneous and complex appearing endometrium
measuring up to 39 mm in thickness. Endometrial thickness is
considered abnormal. Consider follow-up by US in 6-8 weeks, during
the week immediately following menses (exam timing is critical).

## 2018-07-09 ENCOUNTER — Emergency Department (HOSPITAL_BASED_OUTPATIENT_CLINIC_OR_DEPARTMENT_OTHER): Payer: Medicaid Other

## 2018-07-09 ENCOUNTER — Emergency Department (HOSPITAL_BASED_OUTPATIENT_CLINIC_OR_DEPARTMENT_OTHER)
Admission: EM | Admit: 2018-07-09 | Discharge: 2018-07-09 | Disposition: A | Discharge status: Home / Self Care | Payer: Medicaid Other | Attending: Emergency Medicine | Admitting: Emergency Medicine

## 2018-07-09 ENCOUNTER — Other Ambulatory Visit: Payer: Self-pay

## 2018-07-09 ENCOUNTER — Encounter (HOSPITAL_BASED_OUTPATIENT_CLINIC_OR_DEPARTMENT_OTHER): Payer: Self-pay | Admitting: *Deleted

## 2018-07-09 DIAGNOSIS — Z79899 Other long term (current) drug therapy: Secondary | ICD-10-CM | POA: Insufficient documentation

## 2018-07-09 DIAGNOSIS — R42 Dizziness and giddiness: Secondary | ICD-10-CM | POA: Insufficient documentation

## 2018-07-09 DIAGNOSIS — E871 Hypo-osmolality and hyponatremia: Secondary | ICD-10-CM | POA: Insufficient documentation

## 2018-07-09 DIAGNOSIS — R0602 Shortness of breath: Secondary | ICD-10-CM | POA: Insufficient documentation

## 2018-07-09 LAB — COMPREHENSIVE METABOLIC PANEL
ALT: 22 U/L (ref 0–44)
AST: 21 U/L (ref 15–41)
Albumin: 4.1 g/dL (ref 3.5–5.0)
Alkaline Phosphatase: 61 U/L (ref 38–126)
Anion gap: 6 (ref 5–15)
BUN: 13 mg/dL (ref 6–20)
CALCIUM: 9 mg/dL (ref 8.9–10.3)
CO2: 24 mmol/L (ref 22–32)
Chloride: 104 mmol/L (ref 98–111)
Creatinine, Ser: 0.65 mg/dL (ref 0.44–1.00)
GFR calc Af Amer: >60 mL/min (ref >60)
Glucose, Bld: 100 mg/dL — ABNORMAL HIGH (ref 70–99)
Potassium: 3.5 mmol/L (ref 3.5–5.1)
Sodium: 134 mmol/L — ABNORMAL LOW (ref 135–145)
Total Bilirubin: <0.1 mg/dL — ABNORMAL LOW (ref 0.3–1.2)
Total Protein: 7.6 g/dL (ref 6.5–8.1)

## 2018-07-09 LAB — URINALYSIS, ROUTINE W REFLEX MICROSCOPIC
BILIRUBIN URINE: NEGATIVE
Glucose, UA: NEGATIVE mg/dL
Hgb urine dipstick: NEGATIVE
Ketones, ur: NEGATIVE mg/dL
Leukocytes,Ua: NEGATIVE
Nitrite: NEGATIVE
Protein, ur: NEGATIVE mg/dL
pH: 6 (ref 5.0–8.0)

## 2018-07-09 LAB — CBC WITH DIFFERENTIAL/PLATELET
Abs Immature Granulocytes: 0.02 10*3/uL (ref 0.00–0.07)
Basophils Absolute: 0 10*3/uL (ref 0.0–0.1)
Basophils Relative: 0 %
EOS PCT: 1 %
Eosinophils Absolute: 0.1 10*3/uL (ref 0.0–0.5)
HCT: 38.9 % (ref 36.0–46.0)
Hemoglobin: 12.2 g/dL (ref 12.0–15.0)
Immature Granulocytes: 0 %
LYMPHS PCT: 32 %
Lymphs Abs: 2.5 10*3/uL (ref 0.7–4.0)
MCH: 28.5 pg (ref 26.0–34.0)
MCHC: 31.4 g/dL (ref 30.0–36.0)
MCV: 90.9 fL (ref 80.0–100.0)
MONO ABS: 0.8 10*3/uL (ref 0.1–1.0)
Monocytes Relative: 10 %
NEUTROS ABS: 4.4 10*3/uL (ref 1.7–7.7)
Neutrophils Relative %: 57 %
Platelets: 279 10*3/uL (ref 150–400)
RBC: 4.28 MIL/uL (ref 3.87–5.11)
RDW: 13.2 % (ref 11.5–15.5)
WBC: 7.8 10*3/uL (ref 4.0–10.5)
nRBC: 0 % (ref 0.0–0.2)

## 2018-07-09 LAB — D-DIMER, QUANTITATIVE: D-Dimer, Quant: <0.27 ug/mL-FEU (ref 0.00–0.50)

## 2018-07-09 LAB — TROPONIN I: Troponin I: <0.03 ng/mL (ref <0.03)

## 2018-07-09 LAB — PREGNANCY, URINE: Preg Test, Ur: NEGATIVE

## 2018-07-09 MED ORDER — SODIUM CHLORIDE 0.9 % IV BOLUS
1000.0000 mL | Freq: Once | INTRAVENOUS | Status: AC
  Administered 2018-07-09: 1000 mL via INTRAVENOUS

## 2018-07-09 MED ORDER — ALBUTEROL SULFATE HFA 108 (90 BASE) MCG/ACT IN AERS
1.0000 | INHALATION_SPRAY | Freq: Once | RESPIRATORY_TRACT | Status: AC
  Administered 2018-07-09: 1 via RESPIRATORY_TRACT
  Filled 2018-07-09: qty 6.7

## 2018-07-09 MED ORDER — MECLIZINE HCL 25 MG PO TABS
25.0000 mg | ORAL_TABLET | Freq: Once | ORAL | Status: DC
  Filled 2018-07-09: qty 1

## 2018-07-09 NOTE — ED Notes (Signed)
Patient transported to X-ray 

## 2018-07-09 NOTE — ED Triage Notes (Signed)
Pt c/o dizziness x 1 month

## 2018-07-09 NOTE — ED Provider Notes (Signed)
[facility] DEPARTMENT Provider Note   CSN: [id number] Arrival date & time: 07/09/18  2010    History   Chief Complaint Chief Complaint  Patient presents with  . Dizziness    HPI Peggy Savage is a 28 y.o. female history of anemia, here presenting with shortness of breath, dizziness.  Patient states that she has been dizzy for the last month or so but is getting better.  She states that for the last day or so she has been short of breath.  She states that she do not seem to be able to catch her breath.  Patient denies any chest pain.  Denies any recent travel.  She states that she has loose stools for the last month or so after she was given antibiotics for dental infection.  Patient denies any history of C. difficile.      The history is provided by the patient.    Past Medical History:  Diagnosis Date  . Anemia   . Vitamin D deficiency     Patient Active Problem List   Diagnosis Date Noted  . Post term pregnancy at [redacted] weeks gestation 06/19/2017  . Normal labor 06/19/2017  . SVD (spontaneous vaginal delivery) 06/19/2017  . Anemia in pregnancy 03/16/2017  . Vitamin D deficiency 11/29/2016  . Supervision of other normal pregnancy, antepartum 11/21/2016  . Language barrier 11/10/2015    Past Surgical History:  Procedure Laterality Date  . NO PAST SURGERIES       OB History    Gravida  2   Para  2   Term  2   Preterm  0   AB  0   Living  2     SAB  0   TAB  0   Ectopic  0   Multiple  0   Live Births  2            Home Medications    Prior to Admission medications   Medication Sig Start Date End Date Taking? Authorizing Provider  fluticasone (FLONASE) 50 MCG/ACT nasal spray Place 1 spray into both nostrils daily. 09/21/17   Zigmund Gottron, NP  ibuprofen (ADVIL,MOTRIN) 800 MG tablet Take 1 tablet (800 mg total) by mouth 3 (three) times daily. 09/21/17   Zigmund Gottron, NP  Prenat-Fe Poly-Methfol-FA-DHA (VITAFOL  ULTRA) 29-0.6-0.4-200 MG CAPS Take 1 tablet by mouth daily. 09/21/17   Zigmund Gottron, NP    Family History Family History  Problem Relation Age of Onset  . Diabetes Mother   . Hypertension Mother   . Diabetes Father   . Hypertension Father     Social History Social History   Tobacco Use  . Smoking status: Never Smoker  . Smokeless tobacco: Never Used  Substance Use Topics  . Alcohol use: No  . Drug use: No     Allergies   Patient has no known allergies.   Review of Systems Review of Systems  Respiratory: Positive for shortness of breath.   Neurological: Positive for dizziness.  All other systems reviewed and are negative.    Physical Exam Updated Vital Signs BP 118/73 (BP Location: Left Arm)   Pulse 81   Temp 98 F (36.7 C) (Oral)   Resp 18   Ht 5\' 9"  (1.753 m)   Wt 101.6 kg   LMP 07/03/2018   SpO2 100%   BMI 33.06 kg/m   Physical Exam Vitals signs and nursing note reviewed.  Constitutional:  Appearance: Normal appearance.  HENT:     Head: Normocephalic.     Nose: Nose normal.     Mouth/Throat:     Mouth: Mucous membranes are moist.  Eyes:     Extraocular Movements: Extraocular movements intact.     Pupils: Pupils are equal, round, and reactive to light.  Neck:     Musculoskeletal: Normal range of motion.  Cardiovascular:     Rate and Rhythm: Normal rate and regular rhythm.     Pulses: Normal pulses.     Heart sounds: Normal heart sounds.  Pulmonary:     Effort: Pulmonary effort is normal.     Breath sounds: Normal breath sounds.  Abdominal:     General: Abdomen is flat.     Palpations: Abdomen is soft.  Musculoskeletal: Normal range of motion.        General: No tenderness.  Skin:    General: Skin is warm.     Capillary Refill: Capillary refill takes less than 2 seconds.  Neurological:     General: No focal deficit present.     Mental Status: She is alert and oriented to person, place, and time.     Cranial Nerves: No cranial  nerve deficit.     Sensory: No sensory deficit.     Motor: No weakness.     Coordination: Coordination normal.     Comments: Nonfocal neuro exam. Nl gait, nl strength throughout, nl finger to nose, nl sensation   Psychiatric:        Mood and Affect: Mood normal.        Behavior: Behavior normal.      ED Treatments / Results  Labs (all labs ordered are listed, but only abnormal results are displayed) Labs Reviewed  URINALYSIS, ROUTINE W REFLEX MICROSCOPIC - Abnormal; Notable for the following components:      Result Value   Specific Gravity, Urine <1.005 (*)    All other components within normal limits  COMPREHENSIVE METABOLIC PANEL - Abnormal; Notable for the following components:   Sodium 134 (*)    Glucose, Bld 100 (*)    Total Bilirubin <0.1 (*)    All other components within normal limits  PREGNANCY, URINE  CBC WITH DIFFERENTIAL/PLATELET  TROPONIN I  D-DIMER, QUANTITATIVE (NOT AT [facility])    EKG EKG Interpretation  Date/Time:  Monday July 09 2018 21:28:01 EDT Ventricular Rate:  75 PR Interval:    QRS Duration: 96 QT Interval:  385 QTC Calculation: 430 R Axis:   26 Text Interpretation:  Sinus rhythm Low voltage, precordial leads RSR' in V1 or V2, right VCD or RVH Baseline wander in lead(s) V2 No significant change since last tracing Confirmed by Wandra Arthurs [phone]) on 07/09/2018 9:32:55 PM   Radiology Dg Chest 2 View  Result Date: 07/09/2018 CLINICAL DATA:  Shortness of breath EXAM: CHEST - 2 VIEW COMPARISON:  None. FINDINGS: Lungs are clear. The heart size and pulmonary vascularity are normal. No adenopathy. No bone lesions. IMPRESSION: No edema or consolidation. Electronically Signed   By: Lowella Grip III M.D.   On: 07/09/2018 21:25    Procedures Procedures (including critical care time)  Medications Ordered in ED Medications  meclizine (ANTIVERT) tablet 25 mg (25 mg Oral Refused 07/09/18 2121)  albuterol (PROVENTIL HFA;VENTOLIN HFA) 108 (90 Base)  MCG/ACT inhaler 1 puff (has no administration in time range)  sodium chloride 0.9 % bolus 1,000 mL (1,000 mLs Intravenous New Bag/Given 07/09/18 2131)     Initial Impression /  Assessment and Plan / ED Course  I have reviewed the triage vital signs and the nursing notes.  Pertinent labs & imaging results that were available during my care of the patient were reviewed by me and considered in my medical decision making (see chart for details).       Muna Rishel is a 28 y.o. female here with SOB, dizziness. SOB is more acute but she has no recent travel or cough or fever. Low suspicion for PE so will get d-dimer. She has been chronically dizzy and has nonfocal neuro exam and I doubt stroke. Will check labs, d-dimer, CXR. Will get orthostatics and hydrate and reassess.    10:08 PM D-dimer neg. Trop neg. Labs showed sodium 134 and she was given IVF. Told her to increase salt intake. Don't quite know why she has subjective SOB, will give albuterol prn. Stable for discharge.    Final Clinical Impressions(s) / ED Diagnoses   Final diagnoses:  None    ED Discharge Orders    None       Drenda Freeze, MD 07/09/18 2209

## 2018-07-09 NOTE — ED Notes (Signed)
Pt's native language is arabic. Interpreter services utilized. Pt states her primary reason she is here is because of some ShOB. Pt is unable to relate any factors that make it worse. Pt states her ShOB gets better when she rests or drinks lots of water. Pt denies CP. Pt c/o lower back pain that is worse with standing. Pt also reports that she has been treated multiple times for a dental infection and then had diarrhea following the abx.   Pt refusing meclizine stating she is not dizzy at this time. Pt ambulated to restroom and no labored breathing noted with exertion.

## 2018-07-09 NOTE — Discharge Instructions (Signed)
Use albuterol every 4 hrs as needed for shortness of breath.   Stay hydrated. Your salt level is slightly low. Eat food higher in salt   See your doctor in a week   Return to ER if you have worse shortness of breath, dizziness, weakness, numbness

## 2018-07-09 NOTE — ED Notes (Signed)
EKG given to Dr. Yao.  

## 2019-04-19 NOTE — L&D Delivery Note (Addendum)
LABOR COURSE 29 y/o G3P2002 presenting for IOL 2/2 to DFM. Labor was augmented by cytotec, FB and pitocin. AROM with light mec. Labor course was uncomplicated and progressed to complete with titration of pitocin.   Delivery Note Called to room and patient was complete and pushing. Head delivered LOA. Delivered through tight nuchal cord present. Shoulder and body delivered in usual fashion. At 1118 a healthy female was delivered via Vaginal, Spontaneous.  Infant with spontaneous cry, placed on mother's abdomen, dried and stimulated. Cord clamped x 2 after 1-minute delay, and cut by FOB. Cord blood drawn. Placenta delivered spontaneously with gentle cord traction. Appears intact. Fundus firm with massage and Pitocin. Labia, perineum, vagina, and cervix inspected with no lacerations.   APGAR:9,9 ; weight pending.   Cord: 3VC with the following complications:none.    Anesthesia: Epidural Episiotomy: None Lacerations: None Est. Blood Loss (mL): 142  Mom to postpartum.  Baby to Couplet care / Skin to Skin.  Charlton Haws, MD PGY-1 03/25/2020 11:45 AM    OB FELLOW DELIVERY ATTESTATION  I was gloved and present for the delivery in its entirety, and I agree with the above resident's note.    Phill Myron, D.O. OB Fellow  03/26/2020, 2:07 PM

## 2019-05-22 ENCOUNTER — Other Ambulatory Visit (HOSPITAL_COMMUNITY): Payer: Self-pay | Admitting: *Deleted

## 2019-05-22 DIAGNOSIS — D241 Benign neoplasm of right breast: Secondary | ICD-10-CM

## 2019-05-23 ENCOUNTER — Other Ambulatory Visit (HOSPITAL_COMMUNITY): Payer: Self-pay | Admitting: Obstetrics and Gynecology

## 2019-05-23 DIAGNOSIS — D241 Benign neoplasm of right breast: Secondary | ICD-10-CM

## 2019-05-31 ENCOUNTER — Encounter (HOSPITAL_BASED_OUTPATIENT_CLINIC_OR_DEPARTMENT_OTHER): Payer: Self-pay | Admitting: Emergency Medicine

## 2019-05-31 ENCOUNTER — Emergency Department (HOSPITAL_BASED_OUTPATIENT_CLINIC_OR_DEPARTMENT_OTHER)
Admission: EM | Admit: 2019-05-31 | Discharge: 2019-05-31 | Disposition: A | Discharge status: Home / Self Care | Payer: Self-pay | Attending: Emergency Medicine | Admitting: Emergency Medicine

## 2019-05-31 ENCOUNTER — Emergency Department (HOSPITAL_BASED_OUTPATIENT_CLINIC_OR_DEPARTMENT_OTHER): Payer: Self-pay

## 2019-05-31 ENCOUNTER — Other Ambulatory Visit: Payer: Self-pay

## 2019-05-31 DIAGNOSIS — N12 Tubulo-interstitial nephritis, not specified as acute or chronic: Secondary | ICD-10-CM

## 2019-05-31 DIAGNOSIS — N10 Acute tubulo-interstitial nephritis: Secondary | ICD-10-CM | POA: Insufficient documentation

## 2019-05-31 DIAGNOSIS — R1032 Left lower quadrant pain: Secondary | ICD-10-CM | POA: Insufficient documentation

## 2019-05-31 LAB — CBC WITH DIFFERENTIAL/PLATELET
Abs Immature Granulocytes: 0.03 10*3/uL (ref 0.00–0.07)
Basophils Absolute: 0 10*3/uL (ref 0.0–0.1)
Basophils Relative: 1 %
Eosinophils Absolute: 0.1 10*3/uL (ref 0.0–0.5)
Eosinophils Relative: 1 %
HCT: 39.2 % (ref 36.0–46.0)
Hemoglobin: 12.7 g/dL (ref 12.0–15.0)
Immature Granulocytes: 0 %
Lymphocytes Relative: 22 %
Lymphs Abs: 1.9 10*3/uL (ref 0.7–4.0)
MCH: 28.5 pg (ref 26.0–34.0)
MCHC: 32.4 g/dL (ref 30.0–36.0)
MCV: 87.9 fL (ref 80.0–100.0)
Monocytes Absolute: 1 10*3/uL (ref 0.1–1.0)
Monocytes Relative: 12 %
Neutro Abs: 5.4 10*3/uL (ref 1.7–7.7)
Neutrophils Relative %: 64 %
Platelets: 274 10*3/uL (ref 150–400)
RBC: 4.46 MIL/uL (ref 3.87–5.11)
RDW: 13.6 % (ref 11.5–15.5)
WBC: 8.4 10*3/uL (ref 4.0–10.5)
nRBC: 0 % (ref 0.0–0.2)

## 2019-05-31 LAB — URINALYSIS, ROUTINE W REFLEX MICROSCOPIC
Bilirubin Urine: NEGATIVE
Glucose, UA: NEGATIVE mg/dL
Ketones, ur: NEGATIVE mg/dL
Leukocytes,Ua: NEGATIVE
Nitrite: NEGATIVE
Protein, ur: NEGATIVE mg/dL
Specific Gravity, Urine: 1.025 (ref 1.005–1.030)
pH: 6 (ref 5.0–8.0)

## 2019-05-31 LAB — COMPREHENSIVE METABOLIC PANEL
ALT: 58 U/L — ABNORMAL HIGH (ref 0–44)
AST: 43 U/L — ABNORMAL HIGH (ref 15–41)
Albumin: 4.1 g/dL (ref 3.5–5.0)
Alkaline Phosphatase: 67 U/L (ref 38–126)
Anion gap: 7 (ref 5–15)
BUN: 9 mg/dL (ref 6–20)
CO2: 25 mmol/L (ref 22–32)
Calcium: 9.1 mg/dL (ref 8.9–10.3)
Chloride: 103 mmol/L (ref 98–111)
Creatinine, Ser: 0.57 mg/dL (ref 0.44–1.00)
GFR calc Af Amer: >60 mL/min (ref >60)
GFR calc non Af Amer: >60 mL/min (ref >60)
Glucose, Bld: 107 mg/dL — ABNORMAL HIGH (ref 70–99)
Potassium: 3.2 mmol/L — ABNORMAL LOW (ref 3.5–5.1)
Sodium: 135 mmol/L (ref 135–145)
Total Bilirubin: 0.7 mg/dL (ref 0.3–1.2)
Total Protein: 8.3 g/dL — ABNORMAL HIGH (ref 6.5–8.1)

## 2019-05-31 LAB — URINALYSIS, MICROSCOPIC (REFLEX)

## 2019-05-31 LAB — PREGNANCY, URINE: Preg Test, Ur: NEGATIVE

## 2019-05-31 MED ORDER — ONDANSETRON 4 MG PO TBDP: 4.0000 mg | ORAL_TABLET | Freq: Three times a day (TID) | ORAL | 0 refills | Status: DC | PRN

## 2019-05-31 MED ORDER — SULFAMETHOXAZOLE-TRIMETHOPRIM 800-160 MG PO TABS: 1.0000 | ORAL_TABLET | Freq: Two times a day (BID) | ORAL | 0 refills | Status: AC

## 2019-05-31 MED ORDER — POTASSIUM CHLORIDE CRYS ER 20 MEQ PO TBCR
40.0000 meq | EXTENDED_RELEASE_TABLET | Freq: Once | ORAL | Status: AC
  Administered 2019-05-31: 15:00:00 40 meq via ORAL
  Filled 2019-05-31: qty 2

## 2019-05-31 MED ORDER — SODIUM CHLORIDE 0.9 % IV BOLUS
1000.0000 mL | Freq: Once | INTRAVENOUS | Status: AC
  Administered 2019-05-31: 1000 mL via INTRAVENOUS

## 2019-05-31 NOTE — ED Triage Notes (Signed)
Report recurrent urinary symptoms, left flank pain, no more dysuria , 2 days antibiotic, no relief, sent to ED by PCP. Denies hematuria nor Hx kidney stones . No fever

## 2019-05-31 NOTE — Discharge Instructions (Addendum)
Your CAT scan showed no signs of a kidney stone.  You may have recently passed a kidney stone.  However you could have a urinary tract infection that is starting to work his way up into your left kidney.  Given your vomiting, fevers and flank pain and been treated for a kidney infection.  I would continue the cipro that you are taking.  You can take Motrin and Tylenol.  Have given you Zofran for any nausea.  If you start spiking any further fevers, worsening pain, vomiting return to the ER.  Please follow-up on your urine culture.  Follow-up with your primary care doctor.  Your liver enzymes are mildly elevated.  This needs to be rechecked with your primary care doctor.

## 2019-05-31 NOTE — ED Provider Notes (Signed)
[place] EMERGENCY DEPARTMENT Provider Note   CSN: [id number] Arrival date & time: 05/31/19  1251     History Chief Complaint  Patient presents with  . Recurrent UTI    Peggy Savage is a 29 y.o. female.  HPI 29 year old female with no pertinent past medical history presents to the emergency department today for evaluation of dysuria.  Patient states that she developed urinary symptoms several days ago.  She went to urgent care where she had a urine performed.  This showed concern for UTI.  She was placed on Bactrim.  She was notified by the nurse that the culture was susceptible to Bactrim.  Patient reports that her dysuria has slightly improved.  She does report some ongoing dysuria, urgency or frequency.  No hematuria.  No vaginal bleeding or discharge.  Patient also reports some left flank pain.  She reports some nausea and vomiting and low-grade fevers yesterday.  Patient reports taking 4 doses of her Bactrim.  Patient is sexually active with her husband in a monogamous relationship.  Patient denies any vaginal discharge.  No concern for STD.  She has taken no medications for her symptoms besides the antibiotics and Pyridium.  Patient does report using teases that this helps with the symptoms which have not helped.  Patient reports one episode of UTI in the past.  Denies history of kidney stones.  Interpreter was used at bedside with nurse.    Past Medical History:  Diagnosis Date  . Anemia   . Vitamin D deficiency     Patient Active Problem List   Diagnosis Date Noted  . Post term pregnancy at [redacted] weeks gestation 06/19/2017  . Normal labor 06/19/2017  . SVD (spontaneous vaginal delivery) 06/19/2017  . Anemia in pregnancy 03/16/2017  . Vitamin D deficiency 11/29/2016  . Supervision of other normal pregnancy, antepartum 11/21/2016  . Language barrier 11/10/2015    Past Surgical History:  Procedure Laterality Date  . NO PAST SURGERIES       OB History      Gravida  2   Para  2   Term  2   Preterm  0   AB  0   Living  2     SAB  0   TAB  0   Ectopic  0   Multiple  0   Live Births  2           Family History  Problem Relation Age of Onset  . Diabetes Mother   . Hypertension Mother   . Diabetes Father   . Hypertension Father     Social History   Tobacco Use  . Smoking status: Never Smoker  . Smokeless tobacco: Never Used  Substance Use Topics  . Alcohol use: No  . Drug use: No    Home Medications Prior to Admission medications   Medication Sig Start Date End Date Taking? Authorizing Provider  fluticasone (FLONASE) 50 MCG/ACT nasal spray Place 1 spray into both nostrils daily. 09/21/17   Zigmund Gottron, NP  ibuprofen (ADVIL,MOTRIN) 800 MG tablet Take 1 tablet (800 mg total) by mouth 3 (three) times daily. 09/21/17   Zigmund Gottron, NP  Prenat-Fe Poly-Methfol-FA-DHA (VITAFOL ULTRA) 29-0.6-0.4-200 MG CAPS Take 1 tablet by mouth daily. 09/21/17   Zigmund Gottron, NP    Allergies    Patient has no known allergies.  Review of Systems   Review of Systems  Constitutional: Positive for chills and fever.  HENT: Negative for  congestion.   Eyes: Negative for discharge.  Gastrointestinal: Positive for abdominal pain, nausea and vomiting. Negative for diarrhea.  Genitourinary: Positive for dysuria, flank pain, frequency and urgency. Negative for hematuria, vaginal bleeding and vaginal discharge.  Musculoskeletal: Negative for myalgias.  Skin: Negative for color change.  Neurological: Negative for headaches.  Psychiatric/Behavioral: Negative for confusion.    Physical Exam Updated Vital Signs BP 120/78 (BP Location: Right Arm)   Pulse 97   Temp 98.9 F (37.2 C) (Oral)   Resp 18   Ht 5\' 10"  (1.778 m)   Wt 100 kg   LMP 05/11/2019   SpO2 98%   BMI 31.63 kg/m   Physical Exam Vitals and nursing note reviewed.  Constitutional:      General: She is not in acute distress.    Appearance: She is  well-developed. She is not ill-appearing or toxic-appearing.  HENT:     Head: Normocephalic and atraumatic.     Nose: Nose normal.  Eyes:     General:        Right eye: No discharge.        Left eye: No discharge.     Conjunctiva/sclera: Conjunctivae normal.     Pupils: Pupils are equal, round, and reactive to light.  Cardiovascular:     Rate and Rhythm: Normal rate and regular rhythm.     Heart sounds: Normal heart sounds.  Pulmonary:     Effort: Pulmonary effort is normal.     Breath sounds: Normal breath sounds.  Abdominal:     General: Abdomen is flat. Bowel sounds are normal.     Palpations: Abdomen is soft.     Tenderness: There is no abdominal tenderness. There is left CVA tenderness. There is no right CVA tenderness, guarding or rebound.  Musculoskeletal:        General: No tenderness. Normal range of motion.     Cervical back: Normal range of motion and neck supple.  Lymphadenopathy:     Cervical: No cervical adenopathy.  Skin:    General: Skin is warm and dry.     Capillary Refill: Capillary refill takes less than 2 seconds.  Neurological:     Mental Status: She is alert and oriented to person, place, and time.  Psychiatric:        Mood and Affect: Mood normal.        Behavior: Behavior normal.        Thought Content: Thought content normal.        Judgment: Judgment normal.     ED Results / Procedures / Treatments   Labs (all labs ordered are listed, but only abnormal results are displayed) Labs Reviewed  URINALYSIS, ROUTINE W REFLEX MICROSCOPIC - Abnormal; Notable for the following components:      Result Value   APPearance CLOUDY (*)    Hgb urine dipstick TRACE (*)    All other components within normal limits  COMPREHENSIVE METABOLIC PANEL - Abnormal; Notable for the following components:   Potassium 3.2 (*)    Glucose, Bld 107 (*)    Total Protein 8.3 (*)    AST 43 (*)    ALT 58 (*)    All other components within normal limits  URINALYSIS,  MICROSCOPIC (REFLEX) - Abnormal; Notable for the following components:   Bacteria, UA MANY (*)    All other components within normal limits  URINE CULTURE  PREGNANCY, URINE  CBC WITH DIFFERENTIAL/PLATELET    EKG None  Radiology CT Renal Stone Study  Result Date: 05/31/2019 CLINICAL DATA:  Left flank pain EXAM: CT ABDOMEN AND PELVIS WITHOUT CONTRAST TECHNIQUE: Multidetector CT imaging of the abdomen and pelvis was performed following the standard protocol without IV contrast. COMPARISON:  None. FINDINGS: Lower chest: No acute abnormality. Hepatobiliary: No focal liver abnormality is seen. No gallstones, gallbladder wall thickening, or biliary dilatation. Pancreas: Unremarkable. No pancreatic ductal dilatation or surrounding inflammatory changes. Spleen: Normal in size without focal abnormality. Adrenals/Urinary Tract: Adrenal glands are within normal limits. No renal calculi are seen. Mild fullness of the left collecting system and left ureter is noted without definitive calculus. This may be related to edema from recently passed stone. The bladder is decompressed. Stomach/Bowel: Appendix is within normal limits. No obstructive or inflammatory changes of large or small bowel are seen. Stomach is within normal limits. Vascular/Lymphatic: No significant vascular findings are present. No enlarged abdominal or pelvic lymph nodes. Reproductive: Uterus and bilateral adnexa are unremarkable. Other: No abdominal wall hernia or abnormality. No abdominopelvic ascites. Musculoskeletal: No acute or significant osseous findings. IMPRESSION: Mild fullness of left collecting system and left ureter which may be related to edema from recently passed stone. No definitive calculi are seen. Electronically Signed   By: Inez Catalina M.D.   On: 05/31/2019 14:04    Procedures Procedures (including critical care time)  Medications Ordered in ED Medications  sodium chloride 0.9 % bolus 1,000 mL (has no administration in  time range)    ED Course  I have reviewed the triage vital signs and the nursing notes.  Pertinent labs & imaging results that were available during my care of the patient were reviewed by me and considered in my medical decision making (see chart for details).    MDM Rules/Calculators/A&P                     29 year old female presents to ER for ongoing dysuria, urgency, frequency, left flank pain, nausea and vomiting with low-grade fevers.  Patient states that she saw urgent care 2 days ago and was diagnosed with a UTI.  Patient is placed on ciprofloxacin for 7 days.  Patient reports that the fevers have resolved.  She does report some ongoing left flank pain and some dysuria that has continued.  However the Pyridium does help with this.  Patient denies any significant abdominal pain.  Reports some nausea and vomiting.  On exam patient is very well-appearing and nontoxic.  Vital signs reassuring.  Patient is afebrile.  No significant tachycardia noted.  Does have some mild left CVA tenderness.  No focal abdominal tenderness.  Patient denies any vaginal discharge and is sexually active with her husband a monogamous relationship as well as concern for STD.  No indication for GU exam.  Doubt PID.  Will obtain lab work to assess for kidney function along with CT scan to rule out kidney stone.  CBC does not show any leukocytosis.  Normal hemoglobin.  Patient does have a mild hypokalemia 3.2 which was replaced with oral potassium.  Mild elevation of liver enzymes of 43 and 58 respectively.  Normal bilirubin.  Patient has no focal right upper quadrant abdominal pain.  No jaundice appreciated.  UA shows trace hemoglobin, WBCs and many bacteria.  Nitrite negative.  Pregnancy test was negative.  Urine culture is pending.  CT scan performed shows mild fullness of the left collecting system and left ureter that could relate to a recently passed stone.  There is no definite calculi appreciated.  Patient symptoms  seem consistent with pyelonephritis.  Patient is very well-appearing and nontoxic on exam.  She is currently on ciprofloxacin according the patient she was notified by the nurse that the current urine culture was sensitive to Cipro.  Patient to continue her course of Cipro for the next 5 days to complete a 7-day course.  Patient will be given Zofran.  There is no negation for admission at this time.  Pain is under control.  No intractable vomiting.  Patient is afebrile.  Discussed follow-up PCP to recheck her liver enzymes which were only mildly elevated likely secondary to hepatic steatosis.  Will need potassium recheck as well.  Discussed reasons to return to the ER including worsening fevers, vomiting, worsening pain or for any other reason.  Pt is hemodynamically stable, in NAD, & able to ambulate in the ED. Evaluation does not show pathology that would require ongoing emergent intervention or inpatient treatment. I explained the diagnosis to the patient. Pain has been managed & has no complaints prior to dc. Pt is comfortable with above plan and is stable for discharge at this time. All questions were answered prior to disposition. Strict return precautions for f/u to the ED were discussed. Encouraged follow up with PCP.   Final Clinical Impression(s) / ED Diagnoses Final diagnoses:  Pyelonephritis    Rx / DC Orders ED Discharge Orders         Ordered    sulfamethoxazole-trimethoprim (BACTRIM DS) 800-160 MG tablet  2 times daily     05/31/19 1426    ondansetron (ZOFRAN ODT) 4 MG disintegrating tablet  Every 8 hours PRN     05/31/19 1429           Aaron Edelman 05/31/19 1501    Dorie Rank, MD 05/31/19 [phone]

## 2019-06-01 LAB — URINE CULTURE

## 2019-06-13 ENCOUNTER — Ambulatory Visit: Payer: Medicaid Other

## 2019-06-13 ENCOUNTER — Other Ambulatory Visit: Payer: Medicaid Other

## 2019-07-11 ENCOUNTER — Encounter: Payer: Self-pay | Admitting: Student

## 2019-07-11 ENCOUNTER — Ambulatory Visit: Payer: No Typology Code available for payment source | Admitting: Student

## 2019-07-11 ENCOUNTER — Ambulatory Visit
Admission: RE | Admit: 2019-07-11 | Discharge: 2019-07-11 | Disposition: A | Discharge status: Home / Self Care | Payer: Medicaid Other | Source: Ambulatory Visit | Attending: Obstetrics and Gynecology | Admitting: Obstetrics and Gynecology

## 2019-07-11 ENCOUNTER — Other Ambulatory Visit: Payer: Self-pay

## 2019-07-11 VITALS — BP 118/74 | Temp 98.0°F | Wt 227.0 lb

## 2019-07-11 DIAGNOSIS — D241 Benign neoplasm of right breast: Secondary | ICD-10-CM

## 2019-07-11 DIAGNOSIS — N63 Unspecified lump in unspecified breast: Secondary | ICD-10-CM

## 2019-07-11 NOTE — Progress Notes (Signed)
Ms. Peggy Savage is a 29 y.o. female who presents to [facility] clinic today with no complaints. She had diagnostic breast imaging in 2016 due to a breast mass. Likely benign, but recommended close follow up. States she was recently contacted by the breast center to schedule her follow up. Denies noticing any further masses & denies pain, skin changes, or nipple discharge.     Pap Smear: Pap not smear completed today. Last Pap smear was 11/21/2016 at [facility] clinic and was normal. Per patient has no history of an abnormal Pap smear. Last Pap smear result is available in Epic.   Physical exam: Breasts Breasts symmetrical. No skin abnormalities bilateral breasts. No nipple retraction bilateral breasts. No nipple discharge bilateral breasts. No lymphadenopathy. No lumps palpated bilateral breasts.       Pelvic/Bimanual Pap is not indicated today    Smoking History: Patient has never smoked     Patient Navigation: Patient education provided. Access to services provided for patient through [place] interpreter provided.    Colorectal Cancer Screening: Per patient has never had colonoscopy completed No complaints today.    Breast and Cervical Cancer Risk Assessment: Patient does not have family history of breast cancer, known genetic mutations, or radiation treatment to the chest before age 69. Patient does not have history of cervical dysplasia, immunocompromised, or DES exposure in-utero.  Risk Assessment    No risk assessment data      A: [facility] exam without pap smear Complaint of f/u for breast mass  P: Referred patient to the [facility] for a diagnostic mammogram. Appointment scheduled later today.  Jorje Guild, NP 07/11/2019 1:37 PM

## 2019-08-16 ENCOUNTER — Other Ambulatory Visit: Payer: Self-pay

## 2019-08-16 ENCOUNTER — Encounter: Payer: Self-pay | Admitting: Obstetrics

## 2019-08-16 ENCOUNTER — Ambulatory Visit (INDEPENDENT_AMBULATORY_CARE_PROVIDER_SITE_OTHER): Payer: Medicaid Other | Admitting: *Deleted

## 2019-08-16 DIAGNOSIS — Z3201 Encounter for pregnancy test, result positive: Secondary | ICD-10-CM

## 2019-08-16 DIAGNOSIS — O219 Vomiting of pregnancy, unspecified: Secondary | ICD-10-CM

## 2019-08-16 DIAGNOSIS — Z3481 Encounter for supervision of other normal pregnancy, first trimester: Secondary | ICD-10-CM

## 2019-08-16 MED ORDER — PROMETHAZINE HCL 25 MG PO TABS: 25.0000 mg | ORAL_TABLET | Freq: Four times a day (QID) | ORAL | 0 refills | Status: DC | PRN

## 2019-08-16 MED ORDER — VITAFOL ULTRA 29-0.6-0.4-200 MG PO CAPS: 1.0000 | ORAL_CAPSULE | Freq: Every day | ORAL | 12 refills | Status: DC

## 2019-08-16 NOTE — Progress Notes (Signed)
Ms. Discala presents today for UPT. She complain of N&V, cannot tolerate smells. Pt request medication today.  LMP: 06/13/19 EDD:03/19/2020    OBJECTIVE: Appears well, in no apparent distress.  OB History    Gravida  2   Para  2   Term  2   Preterm  0   AB  0   Living  2     SAB  0   TAB  0   Ectopic  0   Multiple  0   Live Births  2          Home UPT Result:Positive In-Office UPT result:Positive    ASSESSMENT: Positive pregnancy test  PLAN: Prenatal care to be completed at: [facility] PNV ordered today- advised to start when nausea is improved Phenergan ordered today- per protocol

## 2019-08-16 NOTE — Progress Notes (Signed)
Patient seen and assessed by nursing staff.  Agree with documentation and plan.  

## 2019-08-24 ENCOUNTER — Encounter (HOSPITAL_BASED_OUTPATIENT_CLINIC_OR_DEPARTMENT_OTHER): Payer: Self-pay

## 2019-08-24 ENCOUNTER — Other Ambulatory Visit: Payer: Self-pay

## 2019-08-24 ENCOUNTER — Emergency Department (HOSPITAL_BASED_OUTPATIENT_CLINIC_OR_DEPARTMENT_OTHER)
Admission: EM | Admit: 2019-08-24 | Discharge: 2019-08-24 | Disposition: A | Discharge status: Home / Self Care | Payer: Medicaid Other | Attending: Emergency Medicine | Admitting: Emergency Medicine

## 2019-08-24 DIAGNOSIS — Z3A09 9 weeks gestation of pregnancy: Secondary | ICD-10-CM | POA: Insufficient documentation

## 2019-08-24 DIAGNOSIS — Z79899 Other long term (current) drug therapy: Secondary | ICD-10-CM | POA: Insufficient documentation

## 2019-08-24 DIAGNOSIS — O219 Vomiting of pregnancy, unspecified: Secondary | ICD-10-CM | POA: Insufficient documentation

## 2019-08-24 LAB — BASIC METABOLIC PANEL
Anion gap: 8 (ref 5–15)
BUN: 9 mg/dL (ref 6–20)
CO2: 22 mmol/L (ref 22–32)
Calcium: 9.2 mg/dL (ref 8.9–10.3)
Chloride: 105 mmol/L (ref 98–111)
Creatinine, Ser: 0.52 mg/dL (ref 0.44–1.00)
GFR calc Af Amer: >60 mL/min (ref >60)
GFR calc non Af Amer: >60 mL/min (ref >60)
Glucose, Bld: 99 mg/dL (ref 70–99)
Potassium: 3.5 mmol/L (ref 3.5–5.1)
Sodium: 135 mmol/L (ref 135–145)

## 2019-08-24 MED ORDER — LACTATED RINGERS IV BOLUS
1000.0000 mL | Freq: Once | INTRAVENOUS | Status: AC
  Administered 2019-08-24: 1000 mL via INTRAVENOUS

## 2019-08-24 MED ORDER — METOCLOPRAMIDE HCL 5 MG/ML IJ SOLN
10.0000 mg | Freq: Once | INTRAMUSCULAR | Status: AC
  Administered 2019-08-24: 10 mg via INTRAVENOUS
  Filled 2019-08-24: qty 2

## 2019-08-24 MED ORDER — METOCLOPRAMIDE HCL 10 MG PO TABS
10.0000 mg | ORAL_TABLET | Freq: Four times a day (QID) | ORAL | 0 refills | Status: DC | PRN
Start: 2019-08-24 — End: 2019-09-09

## 2019-08-24 NOTE — ED Provider Notes (Signed)
[facility]GENCY DEPARTMENT Provider Note   CSN: [id number] Arrival date & time: 08/24/19  2114     History Chief Complaint  Patient presents with  . Emesis    Peggy Savage is a 29 y.o. female.  The history is provided by the patient. The history is limited by a language barrier. A language interpreter was used.  Emesis Severity:  Severe Duration:  2 weeks Timing:  Intermittent Number of daily episodes:  Numerous Quality:  Stomach contents Feeding tolerance: only able to tolerate cold water. Progression:  Worsening Chronicity:  New Relieved by:  Nothing Worsened by:  Food smell Ineffective treatments:  Antiemetics (tried phenergan given by OB but it makes her sleepy and doesn't improve the emesis) Associated symptoms: no abdominal pain, no cough, no diarrhea, no fever, no headaches and no URI   Associated symptoms comment:  Currently [redacted] weeks pregnant.  G3P2.  No abd pain, vaginal bleeding or pelvic pain.   Risk factors: pregnant        Past Medical History:  Diagnosis Date  . Anemia   . Vitamin D deficiency     Patient Active Problem List   Diagnosis Date Noted  . Post term pregnancy at [redacted] weeks gestation 06/19/2017  . Normal labor 06/19/2017  . SVD (spontaneous vaginal delivery) 06/19/2017  . Anemia in pregnancy 03/16/2017  . Vitamin D deficiency 11/29/2016  . Supervision of other normal pregnancy, antepartum 11/21/2016  . Language barrier 11/10/2015    Past Surgical History:  Procedure Laterality Date  . NO PAST SURGERIES       OB History    Gravida  3   Para  2   Term  2   Preterm  0   AB  0   Living  2     SAB  0   TAB  0   Ectopic  0   Multiple  0   Live Births  2           Family History  Problem Relation Age of Onset  . Diabetes Mother   . Hypertension Mother   . Diabetes Father   . Hypertension Father     Social History   Tobacco Use  . Smoking status: Never Smoker  . Smokeless tobacco:  Never Used  Substance Use Topics  . Alcohol use: No  . Drug use: No    Home Medications Prior to Admission medications   Medication Sig Start Date End Date Taking? Authorizing Provider  fluticasone (FLONASE) 50 MCG/ACT nasal spray Place 1 spray into both nostrils daily. Patient not taking: Reported on 07/11/2019 09/21/17   Zigmund Gottron, NP  metoCLOPramide (REGLAN) 10 MG tablet Take 1 tablet (10 mg total) by mouth every 6 (six) hours as needed for nausea or vomiting. 08/24/19   Blanchie Dessert, MD  Prenat-Fe Poly-Methfol-FA-DHA (VITAFOL ULTRA) 29-0.6-0.4-200 MG CAPS Take 1 tablet by mouth daily. 08/16/19   Donnamae Jude, MD  promethazine (PHENERGAN) 25 MG tablet Take 1 tablet (25 mg total) by mouth every 6 (six) hours as needed for nausea. 08/16/19   Donnamae Jude, MD    Allergies    Patient has no known allergies.  Review of Systems   Review of Systems  Constitutional: Negative for fever.  Respiratory: Negative for cough.   Gastrointestinal: Positive for vomiting. Negative for abdominal pain and diarrhea.  Neurological: Negative for headaches.  All other systems reviewed and are negative.   Physical Exam Updated Vital Signs Temp 98  F (36.7 C) (Oral)   Ht 5' 7.72" (1.72 m)   Wt 99 kg   LMP 06/13/2019   BMI 33.46 kg/m    Physical Exam Vitals and nursing note reviewed.  Constitutional:      General: She is not in acute distress.    Appearance: Normal appearance. She is well-developed. She is obese.  HENT:     Head: Normocephalic and atraumatic.  Eyes:     Pupils: Pupils are equal, round, and reactive to light.  Cardiovascular:     Rate and Rhythm: Normal rate and regular rhythm.     Heart sounds: Normal heart sounds. No murmur. No friction rub.  Pulmonary:     Effort: Pulmonary effort is normal.     Breath sounds: Normal breath sounds. No wheezing or rales.  Abdominal:     General: Bowel sounds are normal. There is no distension.     Palpations: Abdomen is soft.      Tenderness: There is no abdominal tenderness. There is no guarding or rebound.  Musculoskeletal:        General: No tenderness. Normal range of motion.     Comments: No edema  Skin:    General: Skin is warm and dry.     Findings: No rash.  Neurological:     Mental Status: She is alert and oriented to person, place, and time.     Cranial Nerves: No cranial nerve deficit.  Psychiatric:        Mood and Affect: Mood normal.        Behavior: Behavior normal.        Thought Content: Thought content normal.      ED Results / Procedures / Treatments   Labs (all labs ordered are listed, but only abnormal results are displayed) Labs Reviewed  BASIC METABOLIC PANEL    EKG None  Radiology No results found.  Procedures Procedures (including critical care time)  Medications Ordered in ED Medications  lactated ringers bolus 1,000 mL (1,000 mLs Intravenous New Bag/Given 08/24/19 2301)  metoCLOPramide (REGLAN) injection 10 mg (10 mg Intravenous Given 08/24/19 2259)    ED Course  I have reviewed the triage vital signs and the nursing notes.  Pertinent labs & imaging results that were available during my care of the patient were reviewed by me and considered in my medical decision making (see chart for details).    MDM Rules/Calculators/A&P                       Final Clinical Impression(s) / ED Diagnoses Final diagnoses:  Nausea and vomiting in pregnancy   29 year old female G3, P2 who is currently [redacted] weeks pregnant presenting due to persistent emesis.  Patient has follow-up appointment with OB/GYN on May 27 and was given Phenergan last week when she made her initial visit but reports that makes her tired and it is not helping with the vomiting.  She is also having intermittent heartburn symptoms.  She denies any abdominal pain, vaginal bleeding or other significant symptoms.  Patient's electrolytes are within normal limits.  She has improved with Reglan.  Will give prescription  for Reglan and encouraged her to continue follow-up as planned in 2 weeks. Given patient has not had any abdominal pain or other significant symptoms low suspicion for ectopic pregnancy or acute pregnancy related emergency.  MDM Number of Diagnoses or Management Options   Amount and/or Complexity of Data Reviewed Clinical lab tests: ordered and reviewed Obtain history  from someone other than the patient: no Review and summarize past medical records: yes Independent visualization of images, tracings, or specimens: yes  Risk of Complications, Morbidity, and/or Mortality Presenting problems: low Diagnostic procedures: low Management options: low  Patient Progress Patient progress: improved  Rx / DC Orders ED Discharge Orders         Ordered    metoCLOPramide (REGLAN) 10 MG tablet  Every 6 hours PRN     08/24/19 2323           Blanchie Dessert, MD 08/24/19 2331

## 2019-08-24 NOTE — ED Triage Notes (Addendum)
Pt is [redacted] weeks pregnant. Pt is having frequent nausea is vomiting every hour. Pt has a Rx for phenergan PO, but states it just makes her sleep a lot and is not helping with nausea. G3P2A0  Pt's native language is arabic. Interpreter services used for triage.

## 2019-09-09 ENCOUNTER — Other Ambulatory Visit (HOSPITAL_COMMUNITY)
Admission: RE | Admit: 2019-09-09 | Discharge: 2019-09-09 | Disposition: A | Discharge status: Home / Self Care | Payer: Medicaid Other | Source: Ambulatory Visit | Attending: Obstetrics & Gynecology | Admitting: Obstetrics & Gynecology

## 2019-09-09 ENCOUNTER — Ambulatory Visit (INDEPENDENT_AMBULATORY_CARE_PROVIDER_SITE_OTHER): Payer: Medicaid Other | Admitting: Obstetrics & Gynecology

## 2019-09-09 ENCOUNTER — Encounter: Payer: Self-pay | Admitting: Obstetrics & Gynecology

## 2019-09-09 ENCOUNTER — Other Ambulatory Visit: Payer: Self-pay

## 2019-09-09 VITALS — BP 114/73 | HR 80 | Wt 220.6 lb

## 2019-09-09 DIAGNOSIS — O219 Vomiting of pregnancy, unspecified: Secondary | ICD-10-CM

## 2019-09-09 DIAGNOSIS — Z3480 Encounter for supervision of other normal pregnancy, unspecified trimester: Secondary | ICD-10-CM

## 2019-09-09 DIAGNOSIS — Z3A12 12 weeks gestation of pregnancy: Secondary | ICD-10-CM

## 2019-09-09 DIAGNOSIS — Z1151 Encounter for screening for human papillomavirus (HPV): Secondary | ICD-10-CM | POA: Diagnosis not present

## 2019-09-09 DIAGNOSIS — E669 Obesity, unspecified: Secondary | ICD-10-CM

## 2019-09-09 DIAGNOSIS — O99212 Obesity complicating pregnancy, second trimester: Secondary | ICD-10-CM

## 2019-09-09 DIAGNOSIS — Z3481 Encounter for supervision of other normal pregnancy, first trimester: Secondary | ICD-10-CM | POA: Insufficient documentation

## 2019-09-09 DIAGNOSIS — R7989 Other specified abnormal findings of blood chemistry: Secondary | ICD-10-CM

## 2019-09-09 DIAGNOSIS — O99210 Obesity complicating pregnancy, unspecified trimester: Secondary | ICD-10-CM

## 2019-09-09 MED ORDER — ONDANSETRON 4 MG PO TBDP: 4.0000 mg | ORAL_TABLET | Freq: Four times a day (QID) | ORAL | 2 refills | Status: DC | PRN

## 2019-09-09 MED ORDER — BLOOD PRESSURE KIT KIT: PACK | 0 refills | Status: DC

## 2019-09-09 MED ORDER — METOCLOPRAMIDE HCL 10 MG PO TABS: 10.0000 mg | ORAL_TABLET | Freq: Four times a day (QID) | ORAL | 2 refills | Status: DC | PRN

## 2019-09-09 NOTE — Patient Instructions (Signed)
First Trimester of Pregnancy The first trimester of pregnancy is from week 1 until the end of week 13 (months 1 through 3). A week after a sperm fertilizes an egg, the egg will implant on the wall of the uterus. This embryo will begin to develop into a baby. Genes from you and your partner will form the baby. The female genes will determine whether the baby will be a boy or a girl. At 6-8 weeks, the eyes and face will be formed, and the heartbeat can be seen on ultrasound. At the end of 12 weeks, all the baby's organs will be formed. Now that you are pregnant, you will want to do everything you can to have a healthy baby. Two of the most important things are to get good prenatal care and to follow your health care provider's instructions. Prenatal care is all the medical care you receive before the baby's birth. This care will help prevent, find, and treat any problems during the pregnancy and childbirth. Body changes during your first trimester Your body goes through many changes during pregnancy. The changes vary from woman to woman.  You may gain or lose a couple of pounds at first.  You may feel sick to your stomach (nauseous) and you may throw up (vomit). If the vomiting is uncontrollable, call your health care provider.  You may tire easily.  You may develop headaches that can be relieved by medicines. All medicines should be approved by your health care provider.  You may urinate more often. Painful urination may mean you have a bladder infection.  You may develop heartburn as a result of your pregnancy.  You may develop constipation because certain hormones are causing the muscles that push stool through your intestines to slow down.  You may develop hemorrhoids or swollen veins (varicose veins).  Your breasts may begin to grow larger and become tender. Your nipples may stick out more, and the tissue that surrounds them (areola) may become darker.  Your gums may bleed and may be  sensitive to brushing and flossing.  Dark spots or blotches (chloasma, mask of pregnancy) may develop on your face. This will likely fade after the baby is born.  Your menstrual periods will stop.  You may have a loss of appetite.  You may develop cravings for certain kinds of food.  You may have changes in your emotions from day to day, such as being excited to be pregnant or being concerned that something may go wrong with the pregnancy and baby.  You may have more vivid and strange dreams.  You may have changes in your hair. These can include thickening of your hair, rapid growth, and changes in texture. Some women also have hair loss during or after pregnancy, or hair that feels dry or thin. Your hair will most likely return to normal after your baby is born. What to expect at prenatal visits During a routine prenatal visit:  You will be weighed to make sure you and the baby are growing normally.  Your blood pressure will be taken.  Your abdomen will be measured to track your baby's growth.  The fetal heartbeat will be listened to between weeks 10 and 14 of your pregnancy.  Test results from any previous visits will be discussed. Your health care provider may ask you:  How you are feeling.  If you are feeling the baby move.  If you have had any abnormal symptoms, such as leaking fluid, bleeding, severe headaches, or abdominal   cramping.  If you are using any tobacco products, including cigarettes, chewing tobacco, and electronic cigarettes.  If you have any questions. Other tests that may be performed during your first trimester include:  Blood tests to find your blood type and to check for the presence of any previous infections. The tests will also be used to check for low iron levels (anemia) and protein on red blood cells (Rh antibodies). Depending on your risk factors, or if you previously had diabetes during pregnancy, you may have tests to check for high blood sugar  that affects pregnant women (gestational diabetes).  Urine tests to check for infections, diabetes, or protein in the urine.  An ultrasound to confirm the proper growth and development of the baby.  Fetal screens for spinal cord problems (spina bifida) and Down syndrome.  HIV (human immunodeficiency virus) testing. Routine prenatal testing includes screening for HIV, unless you choose not to have this test.  You may need other tests to make sure you and the baby are doing well. Follow these instructions at home: Medicines  Follow your health care provider's instructions regarding medicine use. Specific medicines may be either safe or unsafe to take during pregnancy.  Take a prenatal vitamin that contains at least 600 micrograms (mcg) of folic acid.  If you develop constipation, try taking a stool softener if your health care provider approves. Eating and drinking   Eat a balanced diet that includes fresh fruits and vegetables, whole grains, good sources of protein such as meat, eggs, or tofu, and low-fat dairy. Your health care provider will help you determine the amount of weight gain that is right for you.  Avoid raw meat and uncooked cheese. These carry germs that can cause birth defects in the baby.  Eating four or five small meals rather than three large meals a day may help relieve nausea and vomiting. If you start to feel nauseous, eating a few soda crackers can be helpful. Drinking liquids between meals, instead of during meals, also seems to help ease nausea and vomiting.  Limit foods that are high in fat and processed sugars, such as fried and sweet foods.  To prevent constipation: ? Eat foods that are high in fiber, such as fresh fruits and vegetables, whole grains, and beans. ? Drink enough fluid to keep your urine clear or pale yellow. Activity  Exercise only as directed by your health care provider. Most women can continue their usual exercise routine during  pregnancy. Try to exercise for 30 minutes at least 5 days a week. Exercising will help you: ? Control your weight. ? Stay in shape. ? Be prepared for labor and delivery.  Experiencing pain or cramping in the lower abdomen or lower back is a good sign that you should stop exercising. Check with your health care provider before continuing with normal exercises.  Try to avoid standing for long periods of time. Move your legs often if you must stand in one place for a long time.  Avoid heavy lifting.  Wear low-heeled shoes and practice good posture.  You may continue to have sex unless your health care provider tells you not to. Relieving pain and discomfort  Wear a good support bra to relieve breast tenderness.  Take warm sitz baths to soothe any pain or discomfort caused by hemorrhoids. Use hemorrhoid cream if your health care provider approves.  Rest with your legs elevated if you have leg cramps or low back pain.  If you develop varicose veins in   your legs, wear support hose. Elevate your feet for 15 minutes, 3-4 times a day. Limit salt in your diet. Prenatal care  Schedule your prenatal visits by the twelfth week of pregnancy. They are usually scheduled monthly at first, then more often in the last 2 months before delivery.  Write down your questions. Take them to your prenatal visits.  Keep all your prenatal visits as told by your health care provider. This is important. Safety  Wear your seat belt at all times when driving.  Make a list of emergency phone numbers, including numbers for family, friends, the hospital, and police and fire departments. General instructions  Ask your health care provider for a referral to a local prenatal education class. Begin classes no later than the beginning of month 6 of your pregnancy.  Ask for help if you have counseling or nutritional needs during pregnancy. Your health care provider can offer advice or refer you to specialists for help  with various needs.  Do not use hot tubs, steam rooms, or saunas.  Do not douche or use tampons or scented sanitary pads.  Do not cross your legs for long periods of time.  Avoid cat litter boxes and soil used by cats. These carry germs that can cause birth defects in the baby and possibly loss of the fetus by miscarriage or stillbirth.  Avoid all smoking, herbs, alcohol, and medicines not prescribed by your health care provider. Chemicals in these products affect the formation and growth of the baby.  Do not use any products that contain nicotine or tobacco, such as cigarettes and e-cigarettes. If you need help quitting, ask your health care provider. You may receive counseling support and other resources to help you quit.  Schedule a dentist appointment. At home, brush your teeth with a soft toothbrush and be gentle when you floss. Contact a health care provider if:  You have dizziness.  You have mild pelvic cramps, pelvic pressure, or nagging pain in the abdominal area.  You have persistent nausea, vomiting, or diarrhea.  You have a bad smelling vaginal discharge.  You have pain when you urinate.  You notice increased swelling in your face, hands, legs, or ankles.  You are exposed to fifth disease or chickenpox.  You are exposed to German measles (rubella) and have never had it. Get help right away if:  You have a fever.  You are leaking fluid from your vagina.  You have spotting or bleeding from your vagina.  You have severe abdominal cramping or pain.  You have rapid weight gain or loss.  You vomit blood or material that looks like coffee grounds.  You develop a severe headache.  You have shortness of breath.  You have any kind of trauma, such as from a fall or a car accident. Summary  The first trimester of pregnancy is from week 1 until the end of week 13 (months 1 through 3).  Your body goes through many changes during pregnancy. The changes vary from  woman to woman.  You will have routine prenatal visits. During those visits, your health care provider will examine you, discuss any test results you may have, and talk with you about how you are feeling. This information is not intended to replace advice given to you by your health care provider. Make sure you discuss any questions you have with your health care provider. Document Revised: 03/17/2017 Document Reviewed: 03/16/2016 Elsevier Patient Education  2020 Elsevier Inc.  

## 2019-09-09 NOTE — Progress Notes (Signed)
History:   Flossie Wexler is a 29 y.o. [id number] at 70w4dby LMP being seen today for her first obstetrical visit.  Her obstetrical history is significant for two term SVDs. Patient does intend to breast feed. Pregnancy history fully reviewed. Arabic interpreter present.  Patient reports nausea and vomiting. Not alleviated by Phenergan. Desires stronger medication.      HISTORY: OB History  Gravida Para Term Preterm AB Living  '3 2 2 ' 0 0 2  SAB TAB Ectopic Multiple Live Births  0 0 0 0 2    # Outcome Date GA Lbr Len/2nd Weight Sex Delivery Anes PTL Lv  3 Current           2 Term 06/19/17 47w4d9:33 / 00:22 9 lb 5.6 oz (4.24 kg) F Vag-Spont EPI  LIV     Name: Lopiccolo,GIRL Talissa     Apgar1: 8  Apgar5: 9  1 Term 12/01/15 [id number]:49 / 02:03 8 lb 2 oz (3.685 kg) F Vag-Spont EPI  LIV     Name: Friberg,GIRL Masie     Apgar1: 8  Apgar5: 9    Last pap smear was done 2018 and was normal  Past Medical History:  Diagnosis Date  . Anemia   . Vitamin D deficiency    Past Surgical History:  Procedure Laterality Date  . NO PAST SURGERIES     Family History  Problem Relation Age of Onset  . Diabetes Mother   . Hypertension Mother   . Diabetes Father   . Hypertension Father    Social History   Tobacco Use  . Smoking status: Never Smoker  . Smokeless tobacco: Never Used  Substance Use Topics  . Alcohol use: No  . Drug use: No   No Known Allergies Current Outpatient Medications on File Prior to Visit  Medication Sig Dispense Refill  . fluticasone (FLONASE) 50 MCG/ACT nasal spray Place 1 spray into both nostrils daily. (Patient not taking: Reported on 07/11/2019) 16 g 2  . Prenat-Fe Poly-Methfol-FA-DHA (VITAFOL ULTRA) 29-0.6-0.4-200 MG CAPS Take 1 tablet by mouth daily. 30 capsule 12  . promethazine (PHENERGAN) 25 MG tablet Take 1 tablet (25 mg total) by mouth every 6 (six) hours as needed for nausea. 30 tablet 0   No current facility-administered medications on  file prior to visit.    Review of Systems Pertinent items noted in HPI and remainder of comprehensive ROS otherwise negative. Physical Exam:   Vitals:   09/09/19 1512  BP: 114/73  Pulse: 80  Weight: 220 lb 9.6 oz (100.1 kg)   Fetal Heart Rate (bpm): 173  Uterus:     Pelvic Exam: Perineum: no hemorrhoids, normal perineum   Vulva: normal external genitalia, no lesions   Vagina:  normal mucosa, normal discharge   Cervix: no lesions and normal, pap smear done.    Adnexa: normal adnexa and no mass, fullness, tenderness   Bony Pelvis: average  System: General: well-developed, well-nourished female in no acute distress   Breasts:  normal appearance, no masses or tenderness bilaterally   Skin: normal coloration and turgor, no rashes   Neurologic: oriented, normal, negative, normal mood   Extremities: normal strength, tone, and muscle mass, ROM of all joints is normal   HEENT PERRLA, extraocular movement intact and sclera clear, anicteric   Mouth/Teeth mucous membranes moist, pharynx normal without lesions and dental hygiene good   Neck supple and no masses   Cardiovascular: regular rate and rhythm   Respiratory:  no respiratory distress,  normal breath sounds   Abdomen: soft, non-tender; bowel sounds normal; no masses,  no organomegaly    Assessment:    Pregnancy: [id number] Patient Active Problem List   Diagnosis Date Noted  . Vitamin D deficiency 11/29/2016  . Supervision of other normal pregnancy, antepartum 11/21/2016  . Language barrier 11/10/2015     Plan:    1. Nausea and vomiting during pregnancy Antiemetics prescribed. - metoCLOPramide (REGLAN) 10 MG tablet; Take 1 tablet (10 mg total) by mouth 4 (four) times daily as needed for nausea or vomiting.  Dispense: 30 tablet; Refill: 2 - ondansetron (ZOFRAN ODT) 4 MG disintegrating tablet; Take 1 tablet (4 mg total) by mouth every 6 (six) hours as needed for nausea.  Dispense: 30 tablet; Refill: 2  2. Obesity in pregnancy,  antepartum TWG recommended 11-20 lbs. - Comprehensive metabolic panel - Hemoglobin A1c - TSH - Protein / creatinine ratio, urine  3. [redacted] weeks gestation of pregnancy 4. Encounter for supervision of other normal pregnancy in first trimester - Obstetric Panel, Including HIV - Hep C Antibody - Culture, OB Urine - Blood Pressure Monitoring (BLOOD PRESSURE KIT) KIT; Check blood pressure 1-2 times per week  Dispense: 1 kit; Refill: 0 - Cytology - PAP - SMN1 Copy Number Analysis - Genetic Screening - US MFM Fetal Nuchal Translucency; Future - [place] MFM OB COMP + 14 WK; Future  Initial labs drawn. Continue prenatal vitamins. Problem list reviewed and updated. Genetic Screening discussed, First trimester screen and NIPS: ordered. Ultrasound discussed; fetal anatomic survey: ordered. Discussed usage of Babyscripts and virtual visits as additional source of managing and completing prenatal visits in midst of coronavirus and pandemic.   Anticipatory guidance for prenatal visits including labs, ultrasounds, and testing; Initial labs drawn. Encouraged to complete MyChart Registration for her ability to review results, send requests, and have questions addressed.  The nature of [place] [facility] with multiple MDs and Advanced Practice Providers was explained to patient; also emphasized that residents, students are part of our team. Routine obstetric precautions reviewed. Encouraged to seek out care at office or emergency room [facility] preferred) for urgent and/or emergent concerns. Return in about 4 weeks (around 10/07/2019) for OFFICE OB Visit.     Verita Schneiders, MD, [place] for [employer], [place]

## 2019-09-10 LAB — CYTOLOGY - PAP
Chlamydia: NEGATIVE
Comment: NEGATIVE
Comment: NEGATIVE
Comment: NEGATIVE
Comment: NORMAL
Diagnosis: NEGATIVE
High risk HPV: NEGATIVE
Neisseria Gonorrhea: NEGATIVE
Trichomonas: NEGATIVE

## 2019-09-10 LAB — OBSTETRIC PANEL, INCLUDING HIV
Antibody Screen: NEGATIVE
Basophils Absolute: 0 10*3/uL (ref 0.0–0.2)
Basos: 0 %
EOS (ABSOLUTE): 0 10*3/uL (ref 0.0–0.4)
Eos: 0 %
HIV Screen 4th Generation wRfx: NONREACTIVE
Hematocrit: 37.2 % (ref 34.0–46.6)
Hemoglobin: 12.8 g/dL (ref 11.1–15.9)
Hepatitis B Surface Ag: NEGATIVE
Immature Grans (Abs): 0 10*3/uL (ref 0.0–0.1)
Immature Granulocytes: 0 %
Lymphocytes Absolute: 1.8 10*3/uL (ref 0.7–3.1)
Lymphs: 25 %
MCH: 29.8 pg (ref 26.6–33.0)
MCHC: 34.4 g/dL (ref 31.5–35.7)
MCV: 87 fL (ref 79–97)
Monocytes Absolute: 0.8 10*3/uL (ref 0.1–0.9)
Monocytes: 12 %
Neutrophils Absolute: 4.3 10*3/uL (ref 1.4–7.0)
Neutrophils: 63 %
Platelets: 247 10*3/uL (ref 150–450)
RBC: 4.3 x10E6/uL (ref 3.77–5.28)
RDW: 13.9 % (ref 11.7–15.4)
RPR Ser Ql: NONREACTIVE
Rh Factor: POSITIVE
Rubella Antibodies, IGG: 5.78 index (ref >0.99)
WBC: 7 10*3/uL (ref 3.4–10.8)

## 2019-09-10 LAB — COMPREHENSIVE METABOLIC PANEL
ALT: 48 IU/L — ABNORMAL HIGH (ref 0–32)
AST: 30 IU/L (ref 0–40)
Albumin/Globulin Ratio: 1.4 (ref 1.2–2.2)
Albumin: 4.2 g/dL (ref 3.9–5.0)
Alkaline Phosphatase: 58 IU/L (ref 48–121)
BUN/Creatinine Ratio: 16 (ref 9–23)
BUN: 8 mg/dL (ref 6–20)
Bilirubin Total: <0.2 mg/dL (ref 0.0–1.2)
CO2: 18 mmol/L — ABNORMAL LOW (ref 20–29)
Calcium: 9.5 mg/dL (ref 8.7–10.2)
Chloride: 103 mmol/L (ref 96–106)
Creatinine, Ser: 0.51 mg/dL — ABNORMAL LOW (ref 0.57–1.00)
GFR calc Af Amer: 149 mL/min/{1.73_m2} (ref >59)
GFR calc non Af Amer: 129 mL/min/{1.73_m2} (ref >59)
Globulin, Total: 3 g/dL (ref 1.5–4.5)
Glucose: 112 mg/dL — ABNORMAL HIGH (ref 65–99)
Potassium: 3.7 mmol/L (ref 3.5–5.2)
Sodium: 136 mmol/L (ref 134–144)
Total Protein: 7.2 g/dL (ref 6.0–8.5)

## 2019-09-10 LAB — PROTEIN / CREATININE RATIO, URINE
Creatinine, Urine: 248 mg/dL
Protein, Ur: 19.5 mg/dL
Protein/Creat Ratio: 79 mg/g creat (ref 0–200)

## 2019-09-10 LAB — HEMOGLOBIN A1C
Est. average glucose Bld gHb Est-mCnc: 103 mg/dL
Hgb A1c MFr Bld: 5.2 % (ref 4.8–5.6)

## 2019-09-10 LAB — HEPATITIS C ANTIBODY: Hep C Virus Ab: <0.1 s/co ratio (ref 0.0–0.9)

## 2019-09-10 LAB — TSH: TSH: 0.408 u[IU]/mL — ABNORMAL LOW (ref 0.450–4.500)

## 2019-09-10 NOTE — Addendum Note (Signed)
Addended by: Verita Schneiders A on: 09/10/2019 04:11 PM   Modules accepted: Orders

## 2019-09-12 LAB — T4, FREE: Free T4: 1.13 ng/dL (ref 0.82–1.77)

## 2019-09-12 LAB — T3, FREE: T3, Free: 3.1 pg/mL (ref 2.0–4.4)

## 2019-09-12 LAB — SPECIMEN STATUS REPORT

## 2019-09-13 LAB — URINE CULTURE, OB REFLEX

## 2019-09-13 LAB — CULTURE, OB URINE

## 2019-09-16 LAB — SMN1 COPY NUMBER ANALYSIS (SMA CARRIER SCREENING)

## 2019-09-20 ENCOUNTER — Other Ambulatory Visit: Payer: Self-pay

## 2019-09-20 DIAGNOSIS — O2340 Unspecified infection of urinary tract in pregnancy, unspecified trimester: Secondary | ICD-10-CM

## 2019-09-20 MED ORDER — AMOXICILLIN 500 MG PO CAPS: 500.0000 mg | ORAL_CAPSULE | Freq: Three times a day (TID) | ORAL | 2 refills | Status: DC

## 2019-09-20 NOTE — Progress Notes (Signed)
Rx sent as advised by provider for UTI.

## 2019-09-23 ENCOUNTER — Encounter: Payer: Self-pay | Admitting: Obstetrics & Gynecology

## 2019-10-07 ENCOUNTER — Encounter: Payer: Self-pay | Admitting: Obstetrics and Gynecology

## 2019-10-07 ENCOUNTER — Ambulatory Visit (INDEPENDENT_AMBULATORY_CARE_PROVIDER_SITE_OTHER): Payer: Medicaid Other | Admitting: Obstetrics and Gynecology

## 2019-10-07 ENCOUNTER — Other Ambulatory Visit: Payer: Self-pay

## 2019-10-07 DIAGNOSIS — Z3480 Encounter for supervision of other normal pregnancy, unspecified trimester: Secondary | ICD-10-CM

## 2019-10-07 DIAGNOSIS — Z3482 Encounter for supervision of other normal pregnancy, second trimester: Secondary | ICD-10-CM

## 2019-10-07 NOTE — Progress Notes (Signed)
   PRENATAL VISIT NOTE  Subjective:  Peggy Savage is a 29 y.o. G3P2002 at [redacted]w[redacted]d being seen today for ongoing prenatal care.  She is currently monitored for the following issues for this low-risk pregnancy and has Language barrier; Supervision of other normal pregnancy, antepartum; and Vitamin D deficiency on their problem list.  Patient reports nausea and vomiting controlled with medication.  Contractions: Not present. Vag. Bleeding: None.  Movement: Absent. Denies leaking of fluid.   The following portions of the patient's history were reviewed and updated as appropriate: allergies, current medications, past family history, past medical history, past social history, past surgical history and problem list.   Objective:   Vitals:   10/07/19 1543  BP: 138/83  Pulse: (!) 105  Weight: 219 lb (99.3 kg)    Fetal Status: Fetal Heart Rate (bpm): 160   Movement: Absent     General:  Alert, oriented and cooperative. Patient is in no acute distress.  Skin: Skin is warm and dry. No rash noted.   Cardiovascular: Normal heart rate noted  Respiratory: Normal respiratory effort, no problems with respiration noted  Abdomen: Soft, gravid, appropriate for gestational age.  Pain/Pressure: Absent     Pelvic: Cervical exam deferred        Extremities: Normal range of motion.  Edema: None  Mental Status: Normal mood and affect. Normal behavior. Normal judgment and thought content.   Assessment and Plan:  Pregnancy: G3P2002 at [redacted]w[redacted]d 1. Supervision of other normal pregnancy, antepartum Patient is doing well without complaints AFP today Anatomy ultrasound scheduled 7/16  Preterm labor symptoms and general obstetric precautions including but not limited to vaginal bleeding, contractions, leaking of fluid and fetal movement were reviewed in detail with the patient. Please refer to After Visit Summary for other counseling recommendations.   Return in about 4 weeks (around 11/04/2019).  Future  Appointments  Date Time Provider [place]  11/01/2019  1:45 PM [facility] US5 [facility]    Mora Bellman, MD

## 2019-10-07 NOTE — Progress Notes (Signed)
ROB   CC: Nausea and vomiting. Pt has Rx to help.

## 2019-10-09 LAB — AFP, SERUM, OPEN SPINA BIFIDA
AFP MoM: 0.53
AFP Value: 14.4 ng/mL
Gest. Age on Collection Date: 16 weeks
Maternal Age At EDD: 31 yr
OSBR Risk 1 IN: 10000
Test Results:: NEGATIVE
Weight: 219 [lb_av]

## 2019-10-09 LAB — URINE CULTURE, OB REFLEX

## 2019-10-09 LAB — CULTURE, OB URINE

## 2019-11-01 ENCOUNTER — Ambulatory Visit: Payer: Medicaid Other

## 2019-11-04 ENCOUNTER — Other Ambulatory Visit: Payer: Self-pay | Admitting: Advanced Practice Midwife

## 2019-11-04 ENCOUNTER — Other Ambulatory Visit: Payer: Self-pay

## 2019-11-04 ENCOUNTER — Ambulatory Visit (INDEPENDENT_AMBULATORY_CARE_PROVIDER_SITE_OTHER): Payer: Medicaid Other | Admitting: Advanced Practice Midwife

## 2019-11-04 VITALS — BP 115/70 | HR 87 | Wt 219.0 lb

## 2019-11-04 DIAGNOSIS — Z3A20 20 weeks gestation of pregnancy: Secondary | ICD-10-CM

## 2019-11-04 DIAGNOSIS — Z3480 Encounter for supervision of other normal pregnancy, unspecified trimester: Secondary | ICD-10-CM

## 2019-11-04 DIAGNOSIS — R12 Heartburn: Secondary | ICD-10-CM

## 2019-11-04 DIAGNOSIS — O99891 Other specified diseases and conditions complicating pregnancy: Secondary | ICD-10-CM

## 2019-11-04 DIAGNOSIS — O26812 Pregnancy related exhaustion and fatigue, second trimester: Secondary | ICD-10-CM

## 2019-11-04 DIAGNOSIS — O219 Vomiting of pregnancy, unspecified: Secondary | ICD-10-CM

## 2019-11-04 DIAGNOSIS — O26892 Other specified pregnancy related conditions, second trimester: Secondary | ICD-10-CM

## 2019-11-04 DIAGNOSIS — R000 Tachycardia, unspecified: Secondary | ICD-10-CM

## 2019-11-04 DIAGNOSIS — R519 Headache, unspecified: Secondary | ICD-10-CM

## 2019-11-04 MED ORDER — PANTOPRAZOLE SODIUM 40 MG PO TBEC: 40.0000 mg | DELAYED_RELEASE_TABLET | Freq: Every day | ORAL | 2 refills | Status: DC

## 2019-11-04 NOTE — Progress Notes (Signed)
   PRENATAL VISIT NOTE  Subjective:  Peggy Savage is a 29 y.o. G3P2002 at [redacted]w[redacted]d being seen today for ongoing prenatal care.  She is currently monitored for the following issues for this low-risk pregnancy and has Language barrier; Supervision of other normal pregnancy, antepartum; and Vitamin D deficiency on their problem list.  Patient reports headache every day x 2 weeks, occasional racing heartbeat, nausea/vomiting daily, cough/heartburn after vomiting, fatigue.  Contractions: Not present. Vag. Bleeding: None.  Movement: Absent. Denies leaking of fluid.   The following portions of the patient's history were reviewed and updated as appropriate: allergies, current medications, past family history, past medical history, past social history, past surgical history and problem list.   Objective:   Vitals:   11/04/19 1535  BP: 115/70  Pulse: 87  Weight: 219 lb (99.3 kg)    Fetal Status: Fetal Heart Rate (bpm): 155   Movement: Absent     General:  Alert, oriented and cooperative. Patient is in no acute distress.  Skin: Skin is warm and dry. No rash noted.   Cardiovascular: Normal heart rate noted  Respiratory: Normal respiratory effort, no problems with respiration noted  Abdomen: Soft, gravid, appropriate for gestational age.  Pain/Pressure: Absent     Pelvic: Cervical exam deferred        Extremities: Normal range of motion.     Mental Status: Normal mood and affect. Normal behavior. Normal judgment and thought content.   Assessment and Plan:  Pregnancy: G3P2002 at [redacted]w[redacted]d 1. Pregnancy related fatigue in second trimester - CBC - TSH - T3, free - T4, free  2. Racing heart beat --Occurs when standing, working like when washing dishes, resolves with rest. --May be dehydration but will evaluate thyroid and CBC.  Previous TSH low with high normal T3/T4.   --Pt to take nausea medications prescribed previously for the next 2-3 days at least 1 time per day and try to drink 2-3 more  glasses of water. - CBC - TSH - T3, free - T4, free  3. Heartburn during pregnancy in second trimester --Pt with cough after vomiting every morning.  Try daily PPI. - pantoprazole (PROTONIX) 40 MG tablet; Take 1 tablet (40 mg total) by mouth daily.  Dispense: 30 tablet; Refill: 2  4. Nausea and vomiting during pregnancy prior to [redacted] weeks gestation   5. Supervision of other normal pregnancy, antepartum --Anticipatory guidance about next visits/weeks of pregnancy given. --Pt with concerns and symptoms today, will have return visit in 2 weeks to see if improvement.  Can go back to 4 week visits after next week.   6. Headache in pregnancy, antepartum, second trimester --Increase PO intake, see above. --Add Tylenol and caffeine (coffee, tea, soda) PRN for h/a.   --FU in 2 weeks.  Preterm labor symptoms and general obstetric precautions including but not limited to vaginal bleeding, contractions, leaking of fluid and fetal movement were reviewed in detail with the patient. Please refer to After Visit Summary for other counseling recommendations.   No follow-ups on file.  Future Appointments  Date Time Provider [place]  11/13/2019 12:45 PM [facility] US4 [facility]    Fatima Blank, CNM

## 2019-11-04 NOTE — Progress Notes (Signed)
Pt is having heartburn and extreme fatigue.  Pt states she has been having HA's and increase heartrate.

## 2019-11-04 NOTE — Patient Instructions (Signed)

## 2019-11-05 LAB — CBC
Hematocrit: 34.6 % (ref 34.0–46.6)
Hemoglobin: 12.2 g/dL (ref 11.1–15.9)
MCH: 30.4 pg (ref 26.6–33.0)
MCHC: 35.3 g/dL (ref 31.5–35.7)
MCV: 86 fL (ref 79–97)
Platelets: 282 10*3/uL (ref 150–450)
RBC: 4.01 x10E6/uL (ref 3.77–5.28)
RDW: 13 % (ref 11.7–15.4)
WBC: 9.5 10*3/uL (ref 3.4–10.8)

## 2019-11-05 LAB — T4, FREE: Free T4: 0.81 ng/dL — ABNORMAL LOW (ref 0.82–1.77)

## 2019-11-05 LAB — TSH: TSH: 1.19 u[IU]/mL (ref 0.450–4.500)

## 2019-11-05 LAB — T3, FREE: T3, Free: 3 pg/mL (ref 2.0–4.4)

## 2019-11-13 ENCOUNTER — Ambulatory Visit: Payer: Medicaid Other | Attending: Obstetrics & Gynecology

## 2019-11-13 ENCOUNTER — Other Ambulatory Visit: Payer: Self-pay

## 2019-11-13 DIAGNOSIS — Z3481 Encounter for supervision of other normal pregnancy, first trimester: Secondary | ICD-10-CM | POA: Insufficient documentation

## 2019-11-13 DIAGNOSIS — Z3A21 21 weeks gestation of pregnancy: Secondary | ICD-10-CM

## 2019-11-15 ENCOUNTER — Other Ambulatory Visit: Payer: Self-pay | Admitting: *Deleted

## 2019-11-15 DIAGNOSIS — O365920 Maternal care for other known or suspected poor fetal growth, second trimester, not applicable or unspecified: Secondary | ICD-10-CM

## 2019-11-27 ENCOUNTER — Other Ambulatory Visit: Payer: Self-pay

## 2019-11-27 ENCOUNTER — Ambulatory Visit (INDEPENDENT_AMBULATORY_CARE_PROVIDER_SITE_OTHER): Payer: Medicaid Other | Admitting: Obstetrics and Gynecology

## 2019-11-27 VITALS — BP 123/73 | HR 97 | Wt 219.6 lb

## 2019-11-27 DIAGNOSIS — Z789 Other specified health status: Secondary | ICD-10-CM

## 2019-11-27 DIAGNOSIS — Z3480 Encounter for supervision of other normal pregnancy, unspecified trimester: Secondary | ICD-10-CM

## 2019-11-27 NOTE — Progress Notes (Signed)
Pt is here for ROB, [redacted]w[redacted]d.

## 2019-11-27 NOTE — Progress Notes (Signed)
   PRENATAL VISIT NOTE  Subjective:  Peggy Savage is a 29 y.o. G3P2002 at [redacted]w[redacted]d being seen today for ongoing prenatal care.  She is currently monitored for the following issues for this low-risk pregnancy and has Language barrier; Supervision of other normal pregnancy, antepartum; and Vitamin D deficiency on their problem list.  Patient reports no complaints.  Contractions: Not present. Vag. Bleeding: None.  Movement: Present. Denies leaking of fluid.   The following portions of the patient's history were reviewed and updated as appropriate: allergies, current medications, past family history, past medical history, past social history, past surgical history and problem list.   Objective:   Vitals:   11/27/19 1618  BP: 123/73  Pulse: 97  Weight: 219 lb 9.6 oz (99.6 kg)    Fetal Status: Fetal Heart Rate (bpm): 164   Movement: Present     General:  Alert, oriented and cooperative. Patient is in no acute distress.  Skin: Skin is warm and dry. No rash noted.   Cardiovascular: Normal heart rate noted  Respiratory: Normal respiratory effort, no problems with respiration noted  Abdomen: Soft, gravid, appropriate for gestational age.  Pain/Pressure: Absent     Pelvic: Cervical exam deferred        Extremities: Normal range of motion.  Edema: None  Mental Status: Normal mood and affect. Normal behavior. Normal judgment and thought content.   Assessment and Plan:  Pregnancy: G3P2002 at [redacted]w[redacted]d  1. Supervision of other normal pregnancy, antepartum Reviewed labs with patient  2. Language barrier Declined interpretor  Preterm labor symptoms and general obstetric precautions including but not limited to vaginal bleeding, contractions, leaking of fluid and fetal movement were reviewed in detail with the patient. Please refer to After Visit Summary for other counseling recommendations.   Return in about 4 weeks (around 12/25/2019) for in person, 2 hr GTT, 3rd trim labs, TSH.  Future  Appointments  Date Time Provider [place]  12/06/2019  1:00 PM [facility] NURSE [facility]  12/06/2019  1:15 PM [facility] US[facility]  12/25/2019  9:15 AM [facility] None  12/25/2019  9:30 AM Constant, Vickii Chafe, MD [facility] None    Sloan Leiter, MD

## 2019-12-06 ENCOUNTER — Ambulatory Visit: Payer: Medicaid Other | Attending: Obstetrics and Gynecology

## 2019-12-06 ENCOUNTER — Ambulatory Visit: Payer: Medicaid Other | Admitting: *Deleted

## 2019-12-06 ENCOUNTER — Other Ambulatory Visit: Payer: Self-pay

## 2019-12-06 DIAGNOSIS — Z3687 Encounter for antenatal screening for uncertain dates: Secondary | ICD-10-CM

## 2019-12-06 DIAGNOSIS — O365920 Maternal care for other known or suspected poor fetal growth, second trimester, not applicable or unspecified: Secondary | ICD-10-CM | POA: Diagnosis present

## 2019-12-06 DIAGNOSIS — Z3A23 23 weeks gestation of pregnancy: Secondary | ICD-10-CM | POA: Diagnosis not present

## 2019-12-06 DIAGNOSIS — Z362 Encounter for other antenatal screening follow-up: Secondary | ICD-10-CM

## 2019-12-25 ENCOUNTER — Encounter: Payer: No Typology Code available for payment source | Admitting: Obstetrics and Gynecology

## 2019-12-25 ENCOUNTER — Other Ambulatory Visit: Payer: No Typology Code available for payment source

## 2020-01-03 ENCOUNTER — Other Ambulatory Visit: Payer: Medicaid Other

## 2020-01-03 ENCOUNTER — Other Ambulatory Visit: Payer: Self-pay

## 2020-01-03 ENCOUNTER — Ambulatory Visit (INDEPENDENT_AMBULATORY_CARE_PROVIDER_SITE_OTHER): Payer: Medicaid Other | Admitting: Obstetrics and Gynecology

## 2020-01-03 ENCOUNTER — Encounter: Payer: Self-pay | Admitting: Obstetrics and Gynecology

## 2020-01-03 VITALS — BP 99/68 | HR 94 | Wt 221.0 lb

## 2020-01-03 DIAGNOSIS — Z789 Other specified health status: Secondary | ICD-10-CM

## 2020-01-03 DIAGNOSIS — Z3480 Encounter for supervision of other normal pregnancy, unspecified trimester: Secondary | ICD-10-CM

## 2020-01-03 NOTE — Progress Notes (Addendum)
ROB/GTT. C/o ingesting cleaning products 1 month ago and she now have a cough and it hurts to breathe.  Denies fever, chills, body aches, NV. Declined FLU and TDAP Vaccines.

## 2020-01-03 NOTE — Progress Notes (Signed)
Subjective:  Peggy Savage is a 29 y.o. G3P2002 at [redacted]w[redacted]d being seen today for ongoing prenatal care.  She is currently monitored for the following issues for this low-risk pregnancy and has Language barrier; Supervision of other normal pregnancy, antepartum; and Vitamin D deficiency on their problem list.  Patient reports some cough and SOB, thinks related to breathing bathroom cleaner a month ago.  Contractions: Not present. Vag. Bleeding: None.  Movement: Present. Denies leaking of fluid.   The following portions of the patient's history were reviewed and updated as appropriate: allergies, current medications, past family history, past medical history, past social history, past surgical history and problem list. Problem list updated.  Objective:   Vitals:   01/03/20 0912  BP: 99/68  Pulse: 94  Weight: 221 lb (100.2 kg)    Fetal Status: Fetal Heart Rate (bpm): 145   Movement: Present     General:  Alert, oriented and cooperative. Patient is in no acute distress.  Skin: Skin is warm and dry. No rash noted.   Cardiovascular: Normal heart rate noted  Respiratory: Normal respiratory effort, no problems with respiration noted  Abdomen: Soft, gravid, appropriate for gestational age. Pain/Pressure: Absent     Pelvic:  Cervical exam deferred        Extremities: Normal range of motion.  Edema: None  Mental Status: Normal mood and affect. Normal behavior. Normal judgment and thought content.   Urinalysis:      Assessment and Plan:  Pregnancy: G3P2002 at [redacted]w[redacted]d  1. Supervision of other normal pregnancy, antepartum Stable 28 week labs today To see PCP for respiratory complaints  2. Language barrier Declined interupter  Preterm labor symptoms and general obstetric precautions including but not limited to vaginal bleeding, contractions, leaking of fluid and fetal movement were reviewed in detail with the patient. Please refer to After Visit Summary for other counseling  recommendations.  Return in about 3 weeks (around 01/24/2020) for OB visit, face to face, any provider.   Chancy Milroy, MD

## 2020-01-03 NOTE — Patient Instructions (Signed)
Third Trimester of Pregnancy The third trimester is from week 28 through week 40 (months 7 through 9). The third trimester is a time when the unborn baby (fetus) is growing rapidly. At the end of the ninth month, the fetus is about 20 inches in length and weighs 6-10 pounds. Body changes during your third trimester Your body will continue to go through many changes during pregnancy. The changes vary from woman to woman. During the third trimester:  Your weight will continue to increase. You can expect to gain 25-35 pounds (11-16 kg) by the end of the pregnancy.  You may begin to get stretch marks on your hips, abdomen, and breasts.  You may urinate more often because the fetus is moving lower into your pelvis and pressing on your bladder.  You may develop or continue to have heartburn. This is caused by increased hormones that slow down muscles in the digestive tract.  You may develop or continue to have constipation because increased hormones slow digestion and cause the muscles that push waste through your intestines to relax.  You may develop hemorrhoids. These are swollen veins (varicose veins) in the rectum that can itch or be painful.  You may develop swollen, bulging veins (varicose veins) in your legs.  You may have increased body aches in the pelvis, back, or thighs. This is due to weight gain and increased hormones that are relaxing your joints.  You may have changes in your hair. These can include thickening of your hair, rapid growth, and changes in texture. Some women also have hair loss during or after pregnancy, or hair that feels dry or thin. Your hair will most likely return to normal after your baby is born.  Your breasts will continue to grow and they will continue to become tender. A yellow fluid (colostrum) may leak from your breasts. This is the first milk you are producing for your baby.  Your belly button may stick out.  You may notice more swelling in your hands,  face, or ankles.  You may have increased tingling or numbness in your hands, arms, and legs. The skin on your belly may also feel numb.  You may feel short of breath because of your expanding uterus.  You may have more problems sleeping. This can be caused by the size of your belly, increased need to urinate, and an increase in your body's metabolism.  You may notice the fetus "dropping," or moving lower in your abdomen (lightening).  You may have increased vaginal discharge.  You may notice your joints feel loose and you may have pain around your pelvic bone. What to expect at prenatal visits You will have prenatal exams every 2 weeks until week 36. Then you will have weekly prenatal exams. During a routine prenatal visit:  You will be weighed to make sure you and the baby are growing normally.  Your blood pressure will be taken.  Your abdomen will be measured to track your baby's growth.  The fetal heartbeat will be listened to.  Any test results from the previous visit will be discussed.  You may have a cervical check near your due date to see if your cervix has softened or thinned (effaced).  You will be tested for Group B streptococcus. This happens between 35 and 37 weeks. Your health care provider may ask you:  What your birth plan is.  How you are feeling.  If you are feeling the baby move.  If you have had any abnormal   symptoms, such as leaking fluid, bleeding, severe headaches, or abdominal cramping.  If you are using any tobacco products, including cigarettes, chewing tobacco, and electronic cigarettes.  If you have any questions. Other tests or screenings that may be performed during your third trimester include:  Blood tests that check for low iron levels (anemia).  Fetal testing to check the health, activity level, and growth of the fetus. Testing is done if you have certain medical conditions or if there are problems during the pregnancy.  Nonstress test  (NST). This test checks the health of your baby to make sure there are no signs of problems, such as the baby not getting enough oxygen. During this test, a belt is placed around your belly. The baby is made to move, and its heart rate is monitored during movement. What is false labor? False labor is a condition in which you feel small, irregular tightenings of the muscles in the womb (contractions) that usually go away with rest, changing position, or drinking water. These are called Braxton Hicks contractions. Contractions may last for hours, days, or even weeks before true labor sets in. If contractions come at regular intervals, become more frequent, increase in intensity, or become painful, you should see your health care provider. What are the signs of labor?  Abdominal cramps.  Regular contractions that start at 10 minutes apart and become stronger and more frequent with time.  Contractions that start on the top of the uterus and spread down to the lower abdomen and back.  Increased pelvic pressure and dull back pain.  A watery or bloody mucus discharge that comes from the vagina.  Leaking of amniotic fluid. This is also known as your "water breaking." It could be a slow trickle or a gush. Let your health care provider know if it has a color or strange odor. If you have any of these signs, call your health care provider right away, even if it is before your due date. Follow these instructions at home: Medicines  Follow your health care provider's instructions regarding medicine use. Specific medicines may be either safe or unsafe to take during pregnancy.  Take a prenatal vitamin that contains at least 600 micrograms (mcg) of folic acid.  If you develop constipation, try taking a stool softener if your health care provider approves. Eating and drinking   Eat a balanced diet that includes fresh fruits and vegetables, whole grains, good sources of protein such as meat, eggs, or tofu,  and low-fat dairy. Your health care provider will help you determine the amount of weight gain that is right for you.  Avoid raw meat and uncooked cheese. These carry germs that can cause birth defects in the baby.  If you have low calcium intake from food, talk to your health care provider about whether you should take a daily calcium supplement.  Eat four or five small meals rather than three large meals a day.  Limit foods that are high in fat and processed sugars, such as fried and sweet foods.  To prevent constipation: ? Drink enough fluid to keep your urine clear or pale yellow. ? Eat foods that are high in fiber, such as fresh fruits and vegetables, whole grains, and beans. Activity  Exercise only as directed by your health care provider. Most women can continue their usual exercise routine during pregnancy. Try to exercise for 30 minutes at least 5 days a week. Stop exercising if you experience uterine contractions.  Avoid heavy lifting.  Do   not exercise in extreme heat or humidity, or at high altitudes.  Wear low-heel, comfortable shoes.  Practice good posture.  You may continue to have sex unless your health care provider tells you otherwise. Relieving pain and discomfort  Take frequent breaks and rest with your legs elevated if you have leg cramps or low back pain.  Take warm sitz baths to soothe any pain or discomfort caused by hemorrhoids. Use hemorrhoid cream if your health care provider approves.  Wear a good support bra to prevent discomfort from breast tenderness.  If you develop varicose veins: ? Wear support pantyhose or compression stockings as told by your healthcare provider. ? Elevate your feet for 15 minutes, 3-4 times a day. Prenatal care  Write down your questions. Take them to your prenatal visits.  Keep all your prenatal visits as told by your health care provider. This is important. Safety  Wear your seat belt at all times when driving.  Make  a list of emergency phone numbers, including numbers for family, friends, the hospital, and police and fire departments. General instructions  Avoid cat litter boxes and soil used by cats. These carry germs that can cause birth defects in the baby. If you have a cat, ask someone to clean the litter box for you.  Do not travel far distances unless it is absolutely necessary and only with the approval of your health care provider.  Do not use hot tubs, steam rooms, or saunas.  Do not drink alcohol.  Do not use any products that contain nicotine or tobacco, such as cigarettes and e-cigarettes. If you need help quitting, ask your health care provider.  Do not use any medicinal herbs or unprescribed drugs. These chemicals affect the formation and growth of the baby.  Do not douche or use tampons or scented sanitary pads.  Do not cross your legs for long periods of time.  To prepare for the arrival of your baby: ? Take prenatal classes to understand, practice, and ask questions about labor and delivery. ? Make a trial run to the hospital. ? Visit the hospital and tour the maternity area. ? Arrange for maternity or paternity leave through employers. ? Arrange for family and friends to take care of pets while you are in the hospital. ? Purchase a rear-facing car seat and make sure you know how to install it in your car. ? Pack your hospital bag. ? Prepare the baby's nursery. Make sure to remove all pillows and stuffed animals from the baby's crib to prevent suffocation.  Visit your dentist if you have not gone during your pregnancy. Use a soft toothbrush to brush your teeth and be gentle when you floss. Contact a health care provider if:  You are unsure if you are in labor or if your water has broken.  You become dizzy.  You have mild pelvic cramps, pelvic pressure, or nagging pain in your abdominal area.  You have lower back pain.  You have persistent nausea, vomiting, or  diarrhea.  You have an unusual or bad smelling vaginal discharge.  You have pain when you urinate. Get help right away if:  Your water breaks before 37 weeks.  You have regular contractions less than 5 minutes apart before 37 weeks.  You have a fever.  You are leaking fluid from your vagina.  You have spotting or bleeding from your vagina.  You have severe abdominal pain or cramping.  You have rapid weight loss or weight gain.  You have   shortness of breath with chest pain.  You notice sudden or extreme swelling of your face, hands, ankles, feet, or legs.  Your baby makes fewer than 10 movements in 2 hours.  You have severe headaches that do not go away when you take medicine.  You have vision changes. Summary  The third trimester is from week 28 through week 40, months 7 through 9. The third trimester is a time when the unborn baby (fetus) is growing rapidly.  During the third trimester, your discomfort may increase as you and your baby continue to gain weight. You may have abdominal, leg, and back pain, sleeping problems, and an increased need to urinate.  During the third trimester your breasts will keep growing and they will continue to become tender. A yellow fluid (colostrum) may leak from your breasts. This is the first milk you are producing for your baby.  False labor is a condition in which you feel small, irregular tightenings of the muscles in the womb (contractions) that eventually go away. These are called Braxton Hicks contractions. Contractions may last for hours, days, or even weeks before true labor sets in.  Signs of labor can include: abdominal cramps; regular contractions that start at 10 minutes apart and become stronger and more frequent with time; watery or bloody mucus discharge that comes from the vagina; increased pelvic pressure and dull back pain; and leaking of amniotic fluid. This information is not intended to replace advice given to you by your  health care provider. Make sure you discuss any questions you have with your health care provider. Document Revised: 07/26/2018 Document Reviewed: 05/10/2016 Elsevier Patient Education  2020 [employer]  

## 2020-01-04 LAB — CBC
Hematocrit: 33.1 % — ABNORMAL LOW (ref 34.0–46.6)
Hemoglobin: 10.8 g/dL — ABNORMAL LOW (ref 11.1–15.9)
MCH: 28.1 pg (ref 26.6–33.0)
MCHC: 32.6 g/dL (ref 31.5–35.7)
MCV: 86 fL (ref 79–97)
Platelets: 236 10*3/uL (ref 150–450)
RBC: 3.85 x10E6/uL (ref 3.77–5.28)
RDW: 13.4 % (ref 11.7–15.4)
WBC: 8.3 10*3/uL (ref 3.4–10.8)

## 2020-01-04 LAB — HIV ANTIBODY (ROUTINE TESTING W REFLEX): HIV Screen 4th Generation wRfx: NONREACTIVE

## 2020-01-04 LAB — T3, FREE: T3, Free: 2.7 pg/mL (ref 2.0–4.4)

## 2020-01-04 LAB — GLUCOSE TOLERANCE, 2 HOURS W/ 1HR
Glucose, 1 hour: 152 mg/dL (ref 65–179)
Glucose, 2 hour: 105 mg/dL (ref 65–152)
Glucose, Fasting: 80 mg/dL (ref 65–91)

## 2020-01-04 LAB — TSH: TSH: 1.79 u[IU]/mL (ref 0.450–4.500)

## 2020-01-04 LAB — T4, FREE: Free T4: 0.7 ng/dL — ABNORMAL LOW (ref 0.82–1.77)

## 2020-01-04 LAB — RPR: RPR Ser Ql: NONREACTIVE

## 2020-01-14 ENCOUNTER — Other Ambulatory Visit: Payer: Self-pay

## 2020-01-14 NOTE — Telephone Encounter (Signed)
Patient would like refills on her prenatal vitamins sent into [facility].Call to confirm with pharmacy she does have 12 refills.

## 2020-01-28 ENCOUNTER — Ambulatory Visit (INDEPENDENT_AMBULATORY_CARE_PROVIDER_SITE_OTHER): Payer: Medicaid Other | Admitting: Obstetrics and Gynecology

## 2020-01-28 ENCOUNTER — Encounter: Payer: Self-pay | Admitting: Obstetrics and Gynecology

## 2020-01-28 VITALS — BP 114/75 | HR 90 | Wt 222.0 lb

## 2020-01-28 DIAGNOSIS — Z3480 Encounter for supervision of other normal pregnancy, unspecified trimester: Secondary | ICD-10-CM

## 2020-01-28 DIAGNOSIS — Z3483 Encounter for supervision of other normal pregnancy, third trimester: Secondary | ICD-10-CM

## 2020-01-28 MED ORDER — VITAFOL ULTRA 29-0.6-0.4-200 MG PO CAPS: 1.0000 | ORAL_CAPSULE | Freq: Every day | ORAL | 12 refills | Status: DC

## 2020-01-28 NOTE — Progress Notes (Signed)
   PRENATAL VISIT NOTE  Subjective:  Peggy Savage is a 29 y.o. G3P2002 at [redacted]w[redacted]d being seen today for ongoing prenatal care.  She is currently monitored for the following issues for this low-risk pregnancy and has Language barrier; Supervision of other normal pregnancy, antepartum; and Vitamin D deficiency on their problem list.  Patient reports no complaints.  Contractions: Not present. Vag. Bleeding: None.  Movement: Present. Denies leaking of fluid.   The following portions of the patient's history were reviewed and updated as appropriate: allergies, current medications, past family history, past medical history, past social history, past surgical history and problem list.   Objective:   Vitals:   01/28/20 1355  BP: 114/75  Pulse: 90  Weight: 222 lb (100.7 kg)    Fetal Status: Fetal Heart Rate (bpm): 162 Fundal Height: 31 cm Movement: Present     General:  Alert, oriented and cooperative. Patient is in no acute distress.  Skin: Skin is warm and dry. No rash noted.   Cardiovascular: Normal heart rate noted  Respiratory: Normal respiratory effort, no problems with respiration noted  Abdomen: Soft, gravid, appropriate for gestational age.  Pain/Pressure: Absent     Pelvic: Cervical exam deferred        Extremities: Normal range of motion.  Edema: None  Mental Status: Normal mood and affect. Normal behavior. Normal judgment and thought content.   Assessment and Plan:  Pregnancy: G3P2002 at [redacted]w[redacted]d 1. Supervision of other normal pregnancy, antepartum   2. Encounter for supervision of other normal pregnancy in third trimester - Prenat-Fe Poly-Methfol-FA-DHA (VITAFOL ULTRA) 29-0.6-0.4-200 MG CAPS; Take 1 tablet by mouth daily.  Dispense: 30 capsule; Refill: 12  Preterm labor symptoms and general obstetric precautions including but not limited to vaginal bleeding, contractions, leaking of fluid and fetal movement were reviewed in detail with the patient. Please refer to After  Visit Summary for other counseling recommendations.   Return in about 2 weeks (around 02/11/2020) for ROB.  No future appointments.  Cephas Darby, MD

## 2020-01-28 NOTE — Progress Notes (Signed)
ROB 31w  CC: Low B/P readings  at home , pt notes dizziness and headaches pt has not tried any comfort measures for HA's.   PNV"s sent again today per pt pharmacy states she had no RF's .

## 2020-02-11 ENCOUNTER — Other Ambulatory Visit: Payer: Self-pay

## 2020-02-11 ENCOUNTER — Encounter: Payer: Self-pay | Admitting: Obstetrics and Gynecology

## 2020-02-11 ENCOUNTER — Ambulatory Visit (INDEPENDENT_AMBULATORY_CARE_PROVIDER_SITE_OTHER): Payer: Medicaid Other | Admitting: Obstetrics and Gynecology

## 2020-02-11 DIAGNOSIS — Z3480 Encounter for supervision of other normal pregnancy, unspecified trimester: Secondary | ICD-10-CM

## 2020-02-11 NOTE — Progress Notes (Signed)
   PRENATAL VISIT NOTE  Subjective:  Peggy Savage is a 29 y.o. G3P2002 at [redacted]w[redacted]d being seen today for ongoing prenatal care.  She is currently monitored for the following issues for this low-risk pregnancy and has Language barrier; Supervision of other normal pregnancy, antepartum; and Vitamin D deficiency on their problem list.  Patient reports no complaints.  Contractions: Not present. Vag. Bleeding: None.  Movement: Present. Denies leaking of fluid.   The following portions of the patient's history were reviewed and updated as appropriate: allergies, current medications, past family history, past medical history, past social history, past surgical history and problem list.   Objective:   Vitals:   02/11/20 1420  BP: 115/78  Pulse: 96  Weight: 228 lb (103.4 kg)    Fetal Status: Fetal Heart Rate (bpm): 150 Fundal Height: 33 cm Movement: Present     General:  Alert, oriented and cooperative. Patient is in no acute distress.  Skin: Skin is warm and dry. No rash noted.   Cardiovascular: Normal heart rate noted  Respiratory: Normal respiratory effort, no problems with respiration noted  Abdomen: Soft, gravid, appropriate for gestational age.  Pain/Pressure: Present     Pelvic: Cervical exam deferred        Extremities: Normal range of motion.  Edema: None  Mental Status: Normal mood and affect. Normal behavior. Normal judgment and thought content.   Assessment and Plan:  Pregnancy: G3P2002 at [redacted]w[redacted]d 1. Supervision of other normal pregnancy, antepartum   Preterm labor symptoms and general obstetric precautions including but not limited to vaginal bleeding, contractions, leaking of fluid and fetal movement were reviewed in detail with the patient. Please refer to After Visit Summary for other counseling recommendations.   Return in about 2 weeks (around 02/25/2020) for ROB.  Future Appointments  Date Time Provider [place]  02/25/2020  3:00 PM [place], Rona Ravens, NP  [place] None    Cephas Darby, MD

## 2020-02-25 ENCOUNTER — Encounter: Payer: Self-pay | Admitting: Nurse Practitioner

## 2020-02-25 ENCOUNTER — Ambulatory Visit (INDEPENDENT_AMBULATORY_CARE_PROVIDER_SITE_OTHER): Payer: Medicaid Other | Admitting: Nurse Practitioner

## 2020-02-25 ENCOUNTER — Other Ambulatory Visit: Payer: Self-pay

## 2020-02-25 VITALS — BP 110/65 | HR 95 | Wt 229.4 lb

## 2020-02-25 DIAGNOSIS — Z3480 Encounter for supervision of other normal pregnancy, unspecified trimester: Secondary | ICD-10-CM

## 2020-02-25 DIAGNOSIS — Z3A35 35 weeks gestation of pregnancy: Secondary | ICD-10-CM

## 2020-02-25 NOTE — Progress Notes (Signed)
    Subjective:  Peggy Savage is a 29 y.o. G3P2002 at [redacted]w[redacted]d being seen today for ongoing prenatal care.  She is currently monitored for the following issues for this low-risk pregnancy and has Language barrier; Supervision of other normal pregnancy, antepartum; and Vitamin D deficiency on their problem list.  Patient reports no complaints.  Contractions: Not present. Vag. Bleeding: None.  Movement: Present. Denies leaking of fluid.   The following portions of the patient's history were reviewed and updated as appropriate: allergies, current medications, past family history, past medical history, past social history, past surgical history and problem list. Problem list updated.  Objective:   Vitals:   02/25/20 1500  BP: 110/65  Pulse: 95  Weight: 229 lb 6.4 oz (104.1 kg)    Fetal Status: Fetal Heart Rate (bpm): 152 Fundal Height: 36 cm Movement: Present     General:  Alert, oriented and cooperative. Patient is in no acute distress.  Skin: Skin is warm and dry. No rash noted.   Cardiovascular: Normal heart rate noted  Respiratory: Normal respiratory effort, no problems with respiration noted  Abdomen: Soft, gravid, appropriate for gestational age. Pain/Pressure: Absent     Pelvic:  Cervical exam deferred        Extremities: Normal range of motion.  Edema: None  Mental Status: Normal mood and affect. Normal behavior. Normal judgment and thought content.   Urinalysis:      Assessment and Plan:  Pregnancy: G3P2002 at [redacted]w[redacted]d  1. Supervision of other normal pregnancy, antepartum Reviewed EDC based on [place] with client Baby is moving well.  Preterm labor symptoms and general obstetric precautions including but not limited to vaginal bleeding, contractions, leaking of fluid and fetal movement were reviewed in detail with the patient. Please refer to After Visit Summary for other counseling recommendations.  Return in about 1 week (around 03/03/2020) for ROB and vaginal  swabs.  Earlie Server, RN, MSN, NP-BC Nurse Practitioner, [facility] for [employer], [place] [facility] 02/25/2020 3:33 PM

## 2020-02-25 NOTE — Progress Notes (Signed)
ROB 35w  CC: None

## 2020-03-04 ENCOUNTER — Other Ambulatory Visit: Payer: Self-pay

## 2020-03-04 ENCOUNTER — Other Ambulatory Visit (HOSPITAL_COMMUNITY)
Admission: RE | Admit: 2020-03-04 | Discharge: 2020-03-04 | Disposition: A | Discharge status: Home / Self Care | Payer: Medicaid Other | Source: Ambulatory Visit | Attending: Obstetrics and Gynecology | Admitting: Obstetrics and Gynecology

## 2020-03-04 ENCOUNTER — Ambulatory Visit (INDEPENDENT_AMBULATORY_CARE_PROVIDER_SITE_OTHER): Payer: Medicaid Other | Admitting: Certified Nurse Midwife

## 2020-03-04 ENCOUNTER — Encounter: Payer: Self-pay | Admitting: Certified Nurse Midwife

## 2020-03-04 VITALS — BP 132/73 | HR 102 | Wt 231.4 lb

## 2020-03-04 DIAGNOSIS — Z3480 Encounter for supervision of other normal pregnancy, unspecified trimester: Secondary | ICD-10-CM | POA: Insufficient documentation

## 2020-03-04 DIAGNOSIS — O09299 Supervision of pregnancy with other poor reproductive or obstetric history, unspecified trimester: Secondary | ICD-10-CM | POA: Insufficient documentation

## 2020-03-04 DIAGNOSIS — Z789 Other specified health status: Secondary | ICD-10-CM

## 2020-03-04 NOTE — Progress Notes (Signed)
   PRENATAL VISIT NOTE  Subjective:  Peggy Savage is a 29 y.o. G3P2002 at [redacted]w[redacted]d being seen today for ongoing prenatal care.  She is currently monitored for the following issues for this low-risk pregnancy and has Language barrier; Supervision of other normal pregnancy, antepartum; and Vitamin D deficiency on their problem list.  Patient reports occasional cramping.  Contractions: Irritability. Vag. Bleeding: None.  Movement: Present. Denies leaking of fluid.   The following portions of the patient's history were reviewed and updated as appropriate: allergies, current medications, past family history, past medical history, past social history, past surgical history and problem list.   Objective:   Vitals:   03/04/20 1614  BP: 132/73  Pulse: (!) 102  Weight: 231 lb 6.4 oz (105 kg)    Fetal Status: Fetal Heart Rate (bpm): 145 Fundal Height: 37 cm Movement: Present  Presentation: Vertex  General:  Alert, oriented and cooperative. Patient is in no acute distress.  Skin: Skin is warm and dry. No rash noted.   Cardiovascular: Normal heart rate noted  Respiratory: Normal respiratory effort, no problems with respiration noted  Abdomen: Soft, gravid, appropriate for gestational age.  Pain/Pressure: Absent     Pelvic: Cervical exam performed in the presence of a chaperone Dilation: Fingertip Effacement (%): Thick Station: Ballotable  Extremities: Normal range of motion.  Edema: None  Mental Status: Normal mood and affect. Normal behavior. Normal judgment and thought content.   Assessment and Plan:  Pregnancy: G3P2002 at [redacted]w[redacted]d 1. Supervision of other normal pregnancy, antepartum - Patient doing well reports occasional cramping  - patient has a hx of anemia and request CBC today  - routine prenatal care - anticipatory guidance on upcoming appointments  - labor precautions and reasons to present to MAU discussed  - Strep Gp B NAA - Cervicovaginal ancillary only( [place]) -  CBC  2. Language barrier - declines interpreter   3. Hx of shoulder dystocia in prior pregnancy, currently pregnant - patient reports hx of shoulder dystocia with last pregnancy in 2019 - according to chart review patient had 49m10s shoulder dystocia with 9lb 5.6oz baby  - consider IOL at 39 weeks, will obtain follow up US for fetal growth  - [place] MFM OB FOLLOW UP; Future   Preterm labor symptoms and general obstetric precautions including but not limited to vaginal bleeding, contractions, leaking of fluid and fetal movement were reviewed in detail with the patient. Please refer to After Visit Summary for other counseling recommendations.   Return in about 1 week (around 03/11/2020) for LROB, in person.  Future Appointments  Date Time Provider [place]  03/10/2020  3:45 PM Woodroe Mode, MD [place] None    Lajean Manes, CNM

## 2020-03-04 NOTE — Patient Instructions (Signed)
Reasons to go to MAU:  1.  Contractions are  5 minutes apart or less, each last 1 minute, these have been going on for 1-2 hours, and you cannot walk or talk during them 2.  You have a large gush of fluid, or a trickle of fluid that will not stop and you have to wear a pad 3.  You have bleeding that is bright red, heavier than spotting--like menstrual bleeding (spotting can be normal in early labor or after a check of your cervix) 4.  You do not feel the baby moving like he/she normally does  

## 2020-03-05 LAB — CBC
Hematocrit: 33.8 % — ABNORMAL LOW (ref 34.0–46.6)
Hemoglobin: 11 g/dL — ABNORMAL LOW (ref 11.1–15.9)
MCH: 26.1 pg — ABNORMAL LOW (ref 26.6–33.0)
MCHC: 32.5 g/dL (ref 31.5–35.7)
MCV: 80 fL (ref 79–97)
Platelets: 277 10*3/uL (ref 150–450)
RBC: 4.22 x10E6/uL (ref 3.77–5.28)
RDW: 14.6 % (ref 11.7–15.4)
WBC: 8.4 10*3/uL (ref 3.4–10.8)

## 2020-03-06 LAB — CERVICOVAGINAL ANCILLARY ONLY
Chlamydia: NEGATIVE
Comment: NEGATIVE
Comment: NORMAL
Neisseria Gonorrhea: NEGATIVE

## 2020-03-06 LAB — STREP GP B NAA: Strep Gp B NAA: NEGATIVE

## 2020-03-10 ENCOUNTER — Other Ambulatory Visit: Payer: Self-pay

## 2020-03-10 ENCOUNTER — Ambulatory Visit (INDEPENDENT_AMBULATORY_CARE_PROVIDER_SITE_OTHER): Payer: Medicaid Other | Admitting: Obstetrics & Gynecology

## 2020-03-10 VITALS — BP 120/77 | HR 97 | Wt 234.0 lb

## 2020-03-10 DIAGNOSIS — Z789 Other specified health status: Secondary | ICD-10-CM

## 2020-03-10 DIAGNOSIS — Z3A37 37 weeks gestation of pregnancy: Secondary | ICD-10-CM

## 2020-03-10 DIAGNOSIS — Z3480 Encounter for supervision of other normal pregnancy, unspecified trimester: Secondary | ICD-10-CM

## 2020-03-10 DIAGNOSIS — O09299 Supervision of pregnancy with other poor reproductive or obstetric history, unspecified trimester: Secondary | ICD-10-CM

## 2020-03-10 NOTE — Patient Instructions (Signed)

## 2020-03-10 NOTE — Progress Notes (Signed)
   PRENATAL VISIT NOTE  Subjective:  Peggy Savage is a 29 y.o. G3P2002 at [redacted]w[redacted]d being seen today for ongoing prenatal care.  She is currently monitored for the following issues for this high-risk pregnancy and has Language barrier; Supervision of other normal pregnancy, antepartum; Vitamin D deficiency; and Hx of shoulder dystocia in prior pregnancy, currently pregnant on their problem list.  Patient reports no complaints.  Contractions: Not present. Vag. Bleeding: None.  Movement: Present. Denies leaking of fluid.   The following portions of the patient's history were reviewed and updated as appropriate: allergies, current medications, past family history, past medical history, past social history, past surgical history and problem list.   Objective:   Vitals:   03/10/20 1556  BP: 120/77  Pulse: 97  Weight: 234 lb (106.1 kg)    Fetal Status: Fetal Heart Rate (bpm): 142   Movement: Present     General:  Alert, oriented and cooperative. Patient is in no acute distress.  Skin: Skin is warm and dry. No rash noted.   Cardiovascular: Normal heart rate noted  Respiratory: Normal respiratory effort, no problems with respiration noted  Abdomen: Soft, gravid, appropriate for gestational age.  Pain/Pressure: Present     Pelvic: Cervical exam deferred        Extremities: Normal range of motion.  Edema: None  Mental Status: Normal mood and affect. Normal behavior. Normal judgment and thought content.   Assessment and Plan:  Pregnancy: G3P2002 at [redacted]w[redacted]d 1. [redacted] weeks gestation of pregnancy Doing well  2. Hx of shoulder dystocia in prior pregnancy, currently pregnant Fetal macrosomia  3. Supervision of other normal pregnancy, antepartum   4. Language barrier Speaks English [place] and [place]  Preterm labor symptoms and general obstetric precautions including but not limited to vaginal bleeding, contractions, leaking of fluid and fetal movement were reviewed in detail with the  patient. Please refer to After Visit Summary for other counseling recommendations.   Return in about 1 week (around 03/17/2020).  Future Appointments  Date Time Provider [place]  03/17/2020  3:00 PM [facility] NURSE [facility]  03/17/2020  3:15 PM [facility] US[facility]    Emeterio Reeve, MD

## 2020-03-17 ENCOUNTER — Ambulatory Visit: Payer: Medicaid Other

## 2020-03-18 ENCOUNTER — Ambulatory Visit (INDEPENDENT_AMBULATORY_CARE_PROVIDER_SITE_OTHER): Payer: Medicaid Other | Admitting: Obstetrics and Gynecology

## 2020-03-18 ENCOUNTER — Encounter: Payer: Self-pay | Admitting: Obstetrics and Gynecology

## 2020-03-18 ENCOUNTER — Other Ambulatory Visit: Payer: Self-pay

## 2020-03-18 VITALS — BP 130/77 | HR 98 | Wt 232.0 lb

## 2020-03-18 DIAGNOSIS — Z789 Other specified health status: Secondary | ICD-10-CM

## 2020-03-18 DIAGNOSIS — O09299 Supervision of pregnancy with other poor reproductive or obstetric history, unspecified trimester: Secondary | ICD-10-CM

## 2020-03-18 DIAGNOSIS — Z3A38 38 weeks gestation of pregnancy: Secondary | ICD-10-CM | POA: Insufficient documentation

## 2020-03-18 DIAGNOSIS — Z3480 Encounter for supervision of other normal pregnancy, unspecified trimester: Secondary | ICD-10-CM

## 2020-03-18 NOTE — Patient Instructions (Signed)
Third Trimester of Pregnancy The third trimester is from week 28 through week 40 (months 7 through 9). The third trimester is a time when the unborn baby (fetus) is growing rapidly. At the end of the ninth month, the fetus is about 20 inches in length and weighs 6-10 pounds. Body changes during your third trimester Your body will continue to go through many changes during pregnancy. The changes vary from woman to woman. During the third trimester:  Your weight will continue to increase. You can expect to gain 25-35 pounds (11-16 kg) by the end of the pregnancy.  You may begin to get stretch marks on your hips, abdomen, and breasts.  You may urinate more often because the fetus is moving lower into your pelvis and pressing on your bladder.  You may develop or continue to have heartburn. This is caused by increased hormones that slow down muscles in the digestive tract.  You may develop or continue to have constipation because increased hormones slow digestion and cause the muscles that push waste through your intestines to relax.  You may develop hemorrhoids. These are swollen veins (varicose veins) in the rectum that can itch or be painful.  You may develop swollen, bulging veins (varicose veins) in your legs.  You may have increased body aches in the pelvis, back, or thighs. This is due to weight gain and increased hormones that are relaxing your joints.  You may have changes in your hair. These can include thickening of your hair, rapid growth, and changes in texture. Some women also have hair loss during or after pregnancy, or hair that feels dry or thin. Your hair will most likely return to normal after your baby is born.  Your breasts will continue to grow and they will continue to become tender. A yellow fluid (colostrum) may leak from your breasts. This is the first milk you are producing for your baby.  Your belly button may stick out.  You may notice more swelling in your hands,  face, or ankles.  You may have increased tingling or numbness in your hands, arms, and legs. The skin on your belly may also feel numb.  You may feel short of breath because of your expanding uterus.  You may have more problems sleeping. This can be caused by the size of your belly, increased need to urinate, and an increase in your body's metabolism.  You may notice the fetus "dropping," or moving lower in your abdomen (lightening).  You may have increased vaginal discharge.  You may notice your joints feel loose and you may have pain around your pelvic bone. What to expect at prenatal visits You will have prenatal exams every 2 weeks until week 36. Then you will have weekly prenatal exams. During a routine prenatal visit:  You will be weighed to make sure you and the baby are growing normally.  Your blood pressure will be taken.  Your abdomen will be measured to track your baby's growth.  The fetal heartbeat will be listened to.  Any test results from the previous visit will be discussed.  You may have a cervical check near your due date to see if your cervix has softened or thinned (effaced).  You will be tested for Group B streptococcus. This happens between 35 and 37 weeks. Your health care provider may ask you:  What your birth plan is.  How you are feeling.  If you are feeling the baby move.  If you have had any abnormal   symptoms, such as leaking fluid, bleeding, severe headaches, or abdominal cramping.  If you are using any tobacco products, including cigarettes, chewing tobacco, and electronic cigarettes.  If you have any questions. Other tests or screenings that may be performed during your third trimester include:  Blood tests that check for low iron levels (anemia).  Fetal testing to check the health, activity level, and growth of the fetus. Testing is done if you have certain medical conditions or if there are problems during the pregnancy.  Nonstress test  (NST). This test checks the health of your baby to make sure there are no signs of problems, such as the baby not getting enough oxygen. During this test, a belt is placed around your belly. The baby is made to move, and its heart rate is monitored during movement. What is false labor? False labor is a condition in which you feel small, irregular tightenings of the muscles in the womb (contractions) that usually go away with rest, changing position, or drinking water. These are called Braxton Hicks contractions. Contractions may last for hours, days, or even weeks before true labor sets in. If contractions come at regular intervals, become more frequent, increase in intensity, or become painful, you should see your health care provider. What are the signs of labor?  Abdominal cramps.  Regular contractions that start at 10 minutes apart and become stronger and more frequent with time.  Contractions that start on the top of the uterus and spread down to the lower abdomen and back.  Increased pelvic pressure and dull back pain.  A watery or bloody mucus discharge that comes from the vagina.  Leaking of amniotic fluid. This is also known as your "water breaking." It could be a slow trickle or a gush. Let your health care provider know if it has a color or strange odor. If you have any of these signs, call your health care provider right away, even if it is before your due date. Follow these instructions at home: Medicines  Follow your health care provider's instructions regarding medicine use. Specific medicines may be either safe or unsafe to take during pregnancy.  Take a prenatal vitamin that contains at least 600 micrograms (mcg) of folic acid.  If you develop constipation, try taking a stool softener if your health care provider approves. Eating and drinking   Eat a balanced diet that includes fresh fruits and vegetables, whole grains, good sources of protein such as meat, eggs, or tofu,  and low-fat dairy. Your health care provider will help you determine the amount of weight gain that is right for you.  Avoid raw meat and uncooked cheese. These carry germs that can cause birth defects in the baby.  If you have low calcium intake from food, talk to your health care provider about whether you should take a daily calcium supplement.  Eat four or five small meals rather than three large meals a day.  Limit foods that are high in fat and processed sugars, such as fried and sweet foods.  To prevent constipation: ? Drink enough fluid to keep your urine clear or pale yellow. ? Eat foods that are high in fiber, such as fresh fruits and vegetables, whole grains, and beans. Activity  Exercise only as directed by your health care provider. Most women can continue their usual exercise routine during pregnancy. Try to exercise for 30 minutes at least 5 days a week. Stop exercising if you experience uterine contractions.  Avoid heavy lifting.  Do   not exercise in extreme heat or humidity, or at high altitudes.  Wear low-heel, comfortable shoes.  Practice good posture.  You may continue to have sex unless your health care provider tells you otherwise. Relieving pain and discomfort  Take frequent breaks and rest with your legs elevated if you have leg cramps or low back pain.  Take warm sitz baths to soothe any pain or discomfort caused by hemorrhoids. Use hemorrhoid cream if your health care provider approves.  Wear a good support bra to prevent discomfort from breast tenderness.  If you develop varicose veins: ? Wear support pantyhose or compression stockings as told by your healthcare provider. ? Elevate your feet for 15 minutes, 3-4 times a day. Prenatal care  Write down your questions. Take them to your prenatal visits.  Keep all your prenatal visits as told by your health care provider. This is important. Safety  Wear your seat belt at all times when driving.  Make  a list of emergency phone numbers, including numbers for family, friends, the hospital, and police and fire departments. General instructions  Avoid cat litter boxes and soil used by cats. These carry germs that can cause birth defects in the baby. If you have a cat, ask someone to clean the litter box for you.  Do not travel far distances unless it is absolutely necessary and only with the approval of your health care provider.  Do not use hot tubs, steam rooms, or saunas.  Do not drink alcohol.  Do not use any products that contain nicotine or tobacco, such as cigarettes and e-cigarettes. If you need help quitting, ask your health care provider.  Do not use any medicinal herbs or unprescribed drugs. These chemicals affect the formation and growth of the baby.  Do not douche or use tampons or scented sanitary pads.  Do not cross your legs for long periods of time.  To prepare for the arrival of your baby: ? Take prenatal classes to understand, practice, and ask questions about labor and delivery. ? Make a trial run to the hospital. ? Visit the hospital and tour the maternity area. ? Arrange for maternity or paternity leave through employers. ? Arrange for family and friends to take care of pets while you are in the hospital. ? Purchase a rear-facing car seat and make sure you know how to install it in your car. ? Pack your hospital bag. ? Prepare the baby's nursery. Make sure to remove all pillows and stuffed animals from the baby's crib to prevent suffocation.  Visit your dentist if you have not gone during your pregnancy. Use a soft toothbrush to brush your teeth and be gentle when you floss. Contact a health care provider if:  You are unsure if you are in labor or if your water has broken.  You become dizzy.  You have mild pelvic cramps, pelvic pressure, or nagging pain in your abdominal area.  You have lower back pain.  You have persistent nausea, vomiting, or  diarrhea.  You have an unusual or bad smelling vaginal discharge.  You have pain when you urinate. Get help right away if:  Your water breaks before 37 weeks.  You have regular contractions less than 5 minutes apart before 37 weeks.  You have a fever.  You are leaking fluid from your vagina.  You have spotting or bleeding from your vagina.  You have severe abdominal pain or cramping.  You have rapid weight loss or weight gain.  You have   shortness of breath with chest pain.  You notice sudden or extreme swelling of your face, hands, ankles, feet, or legs.  Your baby makes fewer than 10 movements in 2 hours.  You have severe headaches that do not go away when you take medicine.  You have vision changes. Summary  The third trimester is from week 28 through week 40, months 7 through 9. The third trimester is a time when the unborn baby (fetus) is growing rapidly.  During the third trimester, your discomfort may increase as you and your baby continue to gain weight. You may have abdominal, leg, and back pain, sleeping problems, and an increased need to urinate.  During the third trimester your breasts will keep growing and they will continue to become tender. A yellow fluid (colostrum) may leak from your breasts. This is the first milk you are producing for your baby.  False labor is a condition in which you feel small, irregular tightenings of the muscles in the womb (contractions) that eventually go away. These are called Braxton Hicks contractions. Contractions may last for hours, days, or even weeks before true labor sets in.  Signs of labor can include: abdominal cramps; regular contractions that start at 10 minutes apart and become stronger and more frequent with time; watery or bloody mucus discharge that comes from the vagina; increased pelvic pressure and dull back pain; and leaking of amniotic fluid. This information is not intended to replace advice given to you by your  health care provider. Make sure you discuss any questions you have with your health care provider. Document Revised: 07/26/2018 Document Reviewed: 05/10/2016 Elsevier Patient Education  2020 [place].   Fetal Movement Counts  What is a fetal movement count?  A fetal movement count is the number of times that you feel your baby move during a certain amount of time. This may also be called a fetal kick count. A fetal movement count is recommended for every pregnant woman. You may be asked to start counting fetal movements as early as week 28 of your pregnancy. Pay attention to when your baby is most active. You may notice your baby's sleep and wake cycles. You may also notice things that make your baby move more. You should do a fetal movement count:  When your baby is normally most active.  At the same time each day. A good time to count movements is while you are resting, after having something to eat and drink. How do I count fetal movements? 1. Find a quiet, comfortable area. Sit, or lie down on your side. 2. Write down the date, the start time and stop time, and the number of movements that you felt between those two times. Take this information with you to your health care visits. 3. Write down your start time when you feel the first movement. 4. Count kicks, flutters, swishes, rolls, and jabs. You should feel at least 10 movements. 5. You may stop counting after you have felt 10 movements, or if you have been counting for 2 hours. Write down the stop time. 6. If you do not feel 10 movements in 2 hours, contact your health care provider for further instructions. Your health care provider may want to do additional tests to assess your baby's well-being. Contact a health care provider if:  You feel fewer than 10 movements in 2 hours.  Your baby is not moving like he or she usually does. Date: ____________ Start time: ____________ Stop time: ____________ Movements:  ____________ Date: ____________ Start time: ____________ Stop time: ____________ Movements: ____________ Date: ____________ Start time: ____________ Stop time: ____________ Movements: ____________ Date: ____________ Start time: ____________ Stop time: ____________ Movements: ____________ Date: ____________ Start time: ____________ Stop time: ____________ Movements: ____________ Date: ____________ Start time: ____________ Stop time: ____________ Movements: ____________ Date: ____________ Start time: ____________ Stop time: ____________ Movements: ____________ Date: ____________ Start time: ____________ Stop time: ____________ Movements: ____________ Date: ____________ Start time: ____________ Stop time: ____________ Movements: ____________ This information is not intended to replace advice given to you by your health care provider. Make sure you discuss any questions you have with your health care provider. Document Revised: 11/22/2018 Document Reviewed: 11/22/2018 Elsevier Patient Education  [place].

## 2020-03-18 NOTE — Progress Notes (Signed)
   PRENATAL VISIT NOTE  Subjective:  Peggy Savage is a 29 y.o. G3P2002 at [redacted]w[redacted]d being seen today for ongoing prenatal care.  She is currently monitored for the following issues for this low-risk pregnancy and has Language barrier; Supervision of other normal pregnancy, antepartum; Vitamin D deficiency; Hx of shoulder dystocia in prior pregnancy, currently pregnant; and [redacted] weeks gestation of pregnancy on their problem list.  Patient reports no complaints.  Contractions: Not present. Vag. Bleeding: None.  Movement: Present. Denies leaking of fluid.   The following portions of the patient's history were reviewed and updated as appropriate: allergies, current medications, past family history, past medical history, past social history, past surgical history and problem list.   Objective:   Vitals:   03/18/20 1528  BP: 130/77  Pulse: 98  Weight: 232 lb (105.2 kg)    Fetal Status: Fetal Heart Rate (bpm): 135   Movement: Present     General:  Alert, oriented and cooperative. Patient is in no acute distress.  Skin: Skin is warm and dry. No rash noted.   Cardiovascular: Normal heart rate noted  Respiratory: Normal respiratory effort, no problems with respiration noted  Abdomen: Soft, gravid, appropriate for gestational age.  Pain/Pressure: Present. Cephalic on Leopold's.   Pelvic: Cervical exam deferred        Extremities: Normal range of motion.     Mental Status: Normal mood and affect. Normal behavior. Normal judgment and thought content.   Assessment and Plan:  Pregnancy: G3P2002 at [redacted]w[redacted]d 1. [redacted] weeks gestation of pregnancy, Supervision of other normal pregnancy, antepartum: No red flag signs/symptoms. +FM and +FHTs today. Cephalic on Leopold's. Pt missed growth ultrasound scheduled for 03/17/20 (pt was unaware of the appointment). Given size > dates today and h/o shoulder dystocia will plan for rescheduling of growth ultrasound ASAP. - plan for growth ultrasound (reschedule appt  today) - rtc in 1 week for prenatal appt (or schedule IOL pending results of growth ultrasound) - strict return precautions as noted below  2. Hx of shoulder dystocia in prior pregnancy, currently pregnant: Pt with history of shoulder dystocia in first pregnancy (fetal weight 9lb 5.6oz). Today measuring size > dates. - plan for growth ultrasound (reschedule appt today to determine if earlier IOL is warranted)  3. Language barrier: Pt declined interpreter again today. Reports that she does not want an "interpreter from her country".  Term labor symptoms and general obstetric precautions including but not limited to vaginal bleeding, contractions, leaking of fluid and fetal movement were reviewed in detail with the patient. Please refer to After Visit Summary for other counseling recommendations.   Return in about 1 week (around 03/25/2020) for f/u prenatal appt in person, any porvider.  Future Appointments  Date Time Provider [place]  03/23/2020  7:15 AM [facility] NURSE [facility]  03/23/2020  7:30 AM [facility]2 [facility]  03/24/2020  3:15 PM Cephas Darby, MD [facility] None   Astrid Drafts Gildardo Cranker, MD [facility] 03/19/2020 6:27 PM

## 2020-03-18 NOTE — Progress Notes (Signed)
Pt is doing well.

## 2020-03-23 ENCOUNTER — Ambulatory Visit: Payer: Medicaid Other | Admitting: *Deleted

## 2020-03-23 ENCOUNTER — Encounter: Payer: Self-pay | Admitting: *Deleted

## 2020-03-23 ENCOUNTER — Ambulatory Visit (HOSPITAL_BASED_OUTPATIENT_CLINIC_OR_DEPARTMENT_OTHER): Payer: Medicaid Other

## 2020-03-23 ENCOUNTER — Other Ambulatory Visit: Payer: Self-pay

## 2020-03-23 DIAGNOSIS — O09299 Supervision of pregnancy with other poor reproductive or obstetric history, unspecified trimester: Secondary | ICD-10-CM

## 2020-03-23 NOTE — Progress Notes (Signed)
C/o" right mid abd and lower abd pain"

## 2020-03-24 ENCOUNTER — Other Ambulatory Visit: Payer: Self-pay

## 2020-03-24 ENCOUNTER — Encounter (HOSPITAL_COMMUNITY): Payer: Self-pay | Admitting: Obstetrics & Gynecology

## 2020-03-24 ENCOUNTER — Ambulatory Visit (INDEPENDENT_AMBULATORY_CARE_PROVIDER_SITE_OTHER): Payer: Medicaid Other | Admitting: Obstetrics and Gynecology

## 2020-03-24 ENCOUNTER — Encounter: Payer: Self-pay | Admitting: Obstetrics and Gynecology

## 2020-03-24 ENCOUNTER — Inpatient Hospital Stay (HOSPITAL_COMMUNITY)
Admission: AD | Admit: 2020-03-24 | Discharge: 2020-03-26 | DRG: 807 | Disposition: A | Discharge status: Home / Self Care | Payer: Medicaid Other | Attending: Obstetrics & Gynecology | Admitting: Obstetrics & Gynecology

## 2020-03-24 VITALS — BP 117/78 | HR 88 | Wt 233.3 lb

## 2020-03-24 DIAGNOSIS — Z789 Other specified health status: Secondary | ICD-10-CM | POA: Diagnosis present

## 2020-03-24 DIAGNOSIS — O09299 Supervision of pregnancy with other poor reproductive or obstetric history, unspecified trimester: Secondary | ICD-10-CM

## 2020-03-24 DIAGNOSIS — O368130 Decreased fetal movements, third trimester, not applicable or unspecified: Secondary | ICD-10-CM | POA: Diagnosis present

## 2020-03-24 DIAGNOSIS — Z3490 Encounter for supervision of normal pregnancy, unspecified, unspecified trimester: Secondary | ICD-10-CM

## 2020-03-24 DIAGNOSIS — Z3480 Encounter for supervision of other normal pregnancy, unspecified trimester: Secondary | ICD-10-CM

## 2020-03-24 DIAGNOSIS — R946 Abnormal results of thyroid function studies: Secondary | ICD-10-CM | POA: Diagnosis present

## 2020-03-24 DIAGNOSIS — Z20822 Contact with and (suspected) exposure to covid-19: Secondary | ICD-10-CM | POA: Diagnosis present

## 2020-03-24 DIAGNOSIS — Z3A39 39 weeks gestation of pregnancy: Secondary | ICD-10-CM

## 2020-03-24 DIAGNOSIS — R109 Unspecified abdominal pain: Secondary | ICD-10-CM

## 2020-03-24 DIAGNOSIS — O26893 Other specified pregnancy related conditions, third trimester: Secondary | ICD-10-CM

## 2020-03-24 LAB — CBC
HCT: 36.2 % (ref 36.0–46.0)
Hemoglobin: 11 g/dL — ABNORMAL LOW (ref 12.0–15.0)
MCH: 25.1 pg — ABNORMAL LOW (ref 26.0–34.0)
MCHC: 30.4 g/dL (ref 30.0–36.0)
MCV: 82.5 fL (ref 80.0–100.0)
Platelets: 257 10*3/uL (ref 150–400)
RBC: 4.39 MIL/uL (ref 3.87–5.11)
RDW: 16 % — ABNORMAL HIGH (ref 11.5–15.5)
WBC: 6.9 10*3/uL (ref 4.0–10.5)
nRBC: 0 % (ref 0.0–0.2)

## 2020-03-24 LAB — RESP PANEL BY RT-PCR (FLU A&B, COVID) ARPGX2
Influenza A by PCR: NEGATIVE
Influenza B by PCR: NEGATIVE
SARS Coronavirus 2 by RT PCR: NEGATIVE

## 2020-03-24 LAB — TYPE AND SCREEN
ABO/RH(D): A POS
Antibody Screen: NEGATIVE

## 2020-03-24 MED ORDER — ONDANSETRON HCL 4 MG/2ML IJ SOLN
4.0000 mg | Freq: Four times a day (QID) | INTRAMUSCULAR | Status: DC | PRN
  Administered 2020-03-25: 4 mg via INTRAVENOUS
  Filled 2020-03-24: qty 2

## 2020-03-24 MED ORDER — OXYCODONE-ACETAMINOPHEN 5-325 MG PO TABS: 2.0000 | ORAL_TABLET | ORAL | Status: DC | PRN

## 2020-03-24 MED ORDER — LACTATED RINGERS IV SOLN: 500.0000 mL | INTRAVENOUS | Status: DC | PRN

## 2020-03-24 MED ORDER — LIDOCAINE HCL (PF) 1 % IJ SOLN: 30.0000 mL | INTRAMUSCULAR | Status: DC | PRN

## 2020-03-24 MED ORDER — MISOPROSTOL 50MCG HALF TABLET
ORAL_TABLET | ORAL | Status: AC
  Filled 2020-03-24: qty 1

## 2020-03-24 MED ORDER — SOD CITRATE-CITRIC ACID 500-334 MG/5ML PO SOLN: 30.0000 mL | ORAL | Status: DC | PRN

## 2020-03-24 MED ORDER — OXYCODONE-ACETAMINOPHEN 5-325 MG PO TABS: 1.0000 | ORAL_TABLET | ORAL | Status: DC | PRN

## 2020-03-24 MED ORDER — OXYTOCIN-SODIUM CHLORIDE 30-0.9 UT/500ML-% IV SOLN
2.5000 [IU]/h | INTRAVENOUS | Status: DC
  Administered 2020-03-25: 2.5 [IU]/h via INTRAVENOUS

## 2020-03-24 MED ORDER — LACTATED RINGERS IV SOLN: INTRAVENOUS | Status: DC

## 2020-03-24 MED ORDER — OXYTOCIN BOLUS FROM INFUSION
333.0000 mL | Freq: Once | INTRAVENOUS | Status: AC
  Administered 2020-03-25: 333 mL via INTRAVENOUS

## 2020-03-24 MED ORDER — ACETAMINOPHEN 325 MG PO TABS: 650.0000 mg | ORAL_TABLET | ORAL | Status: DC | PRN

## 2020-03-24 MED ORDER — MISOPROSTOL 50MCG HALF TABLET
50.0000 ug | ORAL_TABLET | ORAL | Status: DC | PRN
  Administered 2020-03-24 – 2020-03-25 (×3): 50 ug via BUCCAL
  Filled 2020-03-24 (×2): qty 1

## 2020-03-24 MED ORDER — TERBUTALINE SULFATE 1 MG/ML IJ SOLN: 0.2500 mg | Freq: Once | INTRAMUSCULAR | Status: DC | PRN

## 2020-03-24 MED ORDER — OXYTOCIN-SODIUM CHLORIDE 30-0.9 UT/500ML-% IV SOLN: 1.0000 m[IU]/min | INTRAVENOUS | Status: DC

## 2020-03-24 NOTE — H&P (Addendum)
OBSTETRIC ADMISSION HISTORY AND PHYSICAL  Peggy Savage is a 29 y.o. female G3P2002 with IUP at 42w0dby LMP presenting for IOL-DFM and deceleration during NST in clinic 03/24/20. [facility]. [facility] reports No LOF, no VB, no blurry vision, headaches or peripheral edema, and RUQ pain.  She plans on breast feeding. She is undecided for birth control. [facility] --->  Estimated Date of Delivery: 03/31/20  Sono:   03/23/20'@[redacted]w[redacted]d' , CWD, normal anatomy, cephalic presentation, 32637C 78% EFW  Prenatal History/Complications:  History of shoulder dystocia G2 (4240g) Language barrier (arabic is first language, can communicate in [place] issue) Abnormal thyroid studies intrapartum (not on meds, most recent 01/03/20)  Past Medical History: Past Medical History:  Diagnosis Date  . Anemia   . Vitamin D deficiency     Past Surgical History: Past Surgical History:  Procedure Laterality Date  . NO PAST SURGERIES      Obstetrical History: OB History    Gravida  3   Para  2   Term  2   Preterm  0   AB  0   Living  2     SAB  0   TAB  0   Ectopic  0   Multiple  0   Live Births  2           Social History Social History   Socioeconomic History  . Marital status: Married    Spouse name: Not on file  . Number of children: Not on file  . Years of education: Not on file  . Highest education level: Bachelor's degree (e.g., BA, AB, BS)  Occupational History  . Not on file  Tobacco Use  . Smoking status: Never Smoker  . Smokeless tobacco: Never Used  Vaping Use  . Vaping Use: Never used  Substance and Sexual Activity  . Alcohol use: No  . Drug use: No  . Sexual activity: Yes    Birth control/protection: None  Other Topics Concern  . Not on file  Social History Narrative  . Not on file   Social Determinants of Health   Financial Resource Strain:   . Difficulty of Paying Living  Expenses: Not on file  Food Insecurity:   . Worried About RCharity fundraiserin the Last Year: Not on file  . Ran Out of Food in the Last Year: Not on file  Transportation Needs: No Transportation Needs  . Lack of Transportation (Medical): No  . Lack of Transportation (Non-Medical): No  Physical Activity:   . Days of Exercise per Week: Not on file  . Minutes of Exercise per Session: Not on file  Stress:   . Feeling of Stress : Not on file  Social Connections:   . Frequency of Communication with Friends and Family: Not on file  . Frequency of Social Gatherings with Friends and Family: Not on file  . Attends Religious Services: Not on file  . Active Member of Clubs or Organizations: Not on file  . Attends CArchivistMeetings: Not on file  . Marital Status: Not on file    Family History: Family History  Problem Relation Age of Onset  . Diabetes Mother   . Hypertension Mother   . Diabetes Father   . Hypertension Father     Allergies: No Known Allergies  Medications Prior to Admission  Medication Sig Dispense Refill Last Dose  . Blood  Pressure Monitoring (BLOOD PRESSURE KIT) KIT Check blood pressure 1-2 times per week 1 kit 0   . metoCLOPramide (REGLAN) 10 MG tablet Take 1 tablet (10 mg total) by mouth 4 (four) times daily as needed for nausea or vomiting. 30 tablet 2   . Prenat-Fe Poly-Methfol-FA-DHA (VITAFOL ULTRA) 29-0.6-0.4-200 MG CAPS Take 1 tablet by mouth daily. 30 capsule 12    Review of Systems   All systems reviewed and negative except as stated in HPI  Blood pressure 128/71, pulse 85, temperature 98 F (36.7 C), temperature source Oral, resp. rate 16, height '5\' 9"'  (1.753 m), weight 108.3 kg, last menstrual period 06/06/2019, unknown if currently breastfeeding. General appearance: alert, cooperative and no distress Lungs: normal respiratory effort Heart: regular rate and rhythm Abdomen: soft, non-tender; gravid Pelvic: as noted below Extremities:  Homans sign is negative, no sign of DVT Presentation: cephalic by BSUS Fetal monitoringBaseline: 140 bpm, Variability: Good {> 6 bpm), Accelerations: Reactive and Decelerations: Absent Uterine activityFrequency: intermittent   Prenatal labs: ABO, Rh: A/Positive/-- (05/24 1615) Antibody: Negative (05/24 1615) Rubella: 5.78 (05/24 1615) RPR: Non Reactive (09/17 1006)  HBsAg: Negative (05/24 1615)  HIV: Non Reactive (09/17 1006)  GBS: Negative/-- (11/17 0414)  2 hr Glucola passed Genetic screening  normal Anatomy US EFW 1.6%ile, since resolved now 78%ile  Prenatal Transfer Tool  Maternal Diabetes: No Genetic Screening: Normal Maternal Ultrasounds/Referrals: Normal Fetal Ultrasounds or other Referrals:  None Maternal Substance Abuse:  No Significant Maternal Medications:  None Significant Maternal Lab Results: Group B Strep negative  No results found for this or any previous visit (from the past 24 hour(s)).  Patient Active Problem List   Diagnosis Date Noted  . Normal pregnancy 03/24/2020  . Thyroid function study abnormality 03/24/2020  . Hx of shoulder dystocia in prior pregnancy, currently pregnant 03/04/2020  . Vitamin D deficiency 11/29/2016  . Supervision of other normal pregnancy, antepartum 11/21/2016  . Language barrier 11/10/2015   Assessment/Plan:  Peggy Savage is a 29 y.o. G3P2002 at 58w0dhere for IOL-DFM.  #IOL: Patient seen in clinic 03/24/20 for NST and was found to have a decceleration in addition to endorsing DFM. She was sent to L&D as a direct admission for IOL at this time. Given cervical exam will start cytotec. Consider FB placement at next check.  #Pain: PRN #FWB: Cat 1 #ID: GBS neg #MOF: breast #MOC:undecided #Circ: n/a #Abnormal thyroid studies: repeat at 6 wk pp visit, not on meds. Asymptomatic. #Language barrier: speaks arabic, has previously declined interpreter and signed form for Husband to interpret. Able to converse in English  without difficulty.   CCharlton Haws MD Family Medicine Resident PGY-1  03/24/2020, 5:16 PM   GME ATTESTATION:  I saw and evaluated the patient. I agree with the findings and the plan of care as documented in the resident's note.  AArrie Senate MD OB Fellow, [place]12/10/2019 7:14 PM

## 2020-03-24 NOTE — Progress Notes (Signed)
Patient presents for ROB. Patient states that she has not felt baby move all day today. She states that baby was moving fine last night. She states that she has not had anything to eat today, but has had coffee with milk.

## 2020-03-24 NOTE — Progress Notes (Signed)
   PRENATAL VISIT NOTE  Subjective:  Peggy Savage is a 29 y.o. G3P2002 at [redacted]w[redacted]d being seen today for ongoing prenatal care.  She is currently monitored for the following issues for this low-risk pregnancy and has Language barrier; Supervision of other normal pregnancy, antepartum; Vitamin D deficiency; Hx of shoulder dystocia in prior pregnancy, currently pregnant; and [redacted] weeks gestation of pregnancy on their problem list.  Patient reports decreased fetal movement and right sided abdominal pain..  Contractions: Not present. Vag. Bleeding: None.  Movement: (!) Decreased. Denies leaking of fluid.   NST:  FHTs 175-180, moderate variability, deceleration to the 90s x 1 minute.  The following portions of the patient's history were reviewed and updated as appropriate: allergies, current medications, past family history, past medical history, past social history, past surgical history and problem list.   Objective:   Vitals:   03/24/20 1517  BP: 117/78  Pulse: 88  Weight: 233 lb 4.8 oz (105.8 kg)    Fetal Status: Fetal Heart Rate (bpm): 175 Fundal Height: 40 cm Movement: (!) Decreased     General:  Alert, oriented and cooperative. Patient is in no acute distress.  Skin: Skin is warm and dry. No rash noted.   Cardiovascular: Normal heart rate noted  Respiratory: Normal respiratory effort, no problems with respiration noted  Abdomen: Soft, gravid, appropriate for gestational age.  Pain/Pressure: Absent     Pelvic: Cervical exam deferred        Extremities: Normal range of motion.  Edema: Trace  Mental Status: Normal mood and affect. Normal behavior. Normal judgment and thought content.   Assessment and Plan:  Pregnancy: G3P2002 at [redacted]w[redacted]d 1. Supervision of other normal pregnancy, antepartum   2. Hx of shoulder dystocia in prior pregnancy, currently pregnant   3. Decreased fetal movements in third trimester, single or unspecified fetus - To L&D now  4. Abdominal pain during  pregnancy in third trimester - To L&D now  Term labor symptoms and general obstetric precautions including but not limited to vaginal bleeding, contractions, leaking of fluid and fetal movement were reviewed in detail with the patient. Please refer to After Visit Summary for other counseling recommendations.   No follow-ups on file.  No future appointments.  Cephas Darby, MD

## 2020-03-24 NOTE — Progress Notes (Signed)
Labor Progress Note Peggy Savage is a 29 y.o. G3P2002 at [redacted]w[redacted]d presented for IOL for decreased fetal movement  S: Not feeling anything   O:  BP 129/74   Pulse 86   Temp 98.5 F (36.9 C) (Oral)   Resp 16   Ht 5\' 9"  (1.753 m)   Wt 108.3 kg   LMP 06/06/2019 (Approximate)   BMI 35.26 kg/m  EFM: baseline 150/mod variability/pos accels/no decels   CVE: Dilation: Closed Effacement (%): Thick Station: -3 Presentation: Vertex Exam by:: Carmelia Roller, RNC   A&P: 29 y.o. [id number] [redacted]w[redacted]d admitted for IOL for Decreased fetal movement  #Labor: s/p cytotec x1. On repeat exam still closed, will proceed w additional dose cytotec, FB when able  #Hx shoulder dystocia: Leopold's to 8.5 lbs, Pelvis tested to 9lbs 6oz. Dystocia precautions at delivery   #Pain: pain meds, epidural prn  #FWB: cat I  #GBS negative    Peggy Berlin, MD 11:41 PM

## 2020-03-25 ENCOUNTER — Inpatient Hospital Stay (HOSPITAL_COMMUNITY): Payer: Medicaid Other | Admitting: Anesthesiology

## 2020-03-25 ENCOUNTER — Encounter (HOSPITAL_COMMUNITY): Payer: Self-pay | Admitting: Obstetrics & Gynecology

## 2020-03-25 DIAGNOSIS — Z3A39 39 weeks gestation of pregnancy: Secondary | ICD-10-CM

## 2020-03-25 DIAGNOSIS — O368130 Decreased fetal movements, third trimester, not applicable or unspecified: Secondary | ICD-10-CM

## 2020-03-25 LAB — RPR: RPR Ser Ql: NONREACTIVE

## 2020-03-25 MED ORDER — COCONUT OIL OIL: 1.0000 "application " | TOPICAL_OIL | Status: DC | PRN

## 2020-03-25 MED ORDER — ONDANSETRON HCL 4 MG/2ML IJ SOLN: 4.0000 mg | INTRAMUSCULAR | Status: DC | PRN

## 2020-03-25 MED ORDER — EPHEDRINE 5 MG/ML INJ: 10.0000 mg | INTRAVENOUS | Status: DC | PRN

## 2020-03-25 MED ORDER — OXYTOCIN-SODIUM CHLORIDE 30-0.9 UT/500ML-% IV SOLN
1.0000 m[IU]/min | INTRAVENOUS | Status: DC
  Filled 2020-03-25: qty 500

## 2020-03-25 MED ORDER — FENTANYL-BUPIVACAINE-NACL 0.5-0.125-0.9 MG/250ML-% EP SOLN: 12.0000 mL/h | EPIDURAL | Status: DC | PRN

## 2020-03-25 MED ORDER — ONDANSETRON HCL 4 MG PO TABS: 4.0000 mg | ORAL_TABLET | ORAL | Status: DC | PRN

## 2020-03-25 MED ORDER — DIBUCAINE (PERIANAL) 1 % EX OINT: 1.0000 "application " | TOPICAL_OINTMENT | CUTANEOUS | Status: DC | PRN

## 2020-03-25 MED ORDER — SIMETHICONE 80 MG PO CHEW: 80.0000 mg | CHEWABLE_TABLET | ORAL | Status: DC | PRN

## 2020-03-25 MED ORDER — PHENYLEPHRINE 40 MCG/ML (10ML) SYRINGE FOR IV PUSH (FOR BLOOD PRESSURE SUPPORT): 80.0000 ug | PREFILLED_SYRINGE | INTRAVENOUS | Status: DC | PRN

## 2020-03-25 MED ORDER — BENZOCAINE-MENTHOL 20-0.5 % EX AERO: 1.0000 "application " | INHALATION_SPRAY | CUTANEOUS | Status: DC | PRN

## 2020-03-25 MED ORDER — IBUPROFEN 600 MG PO TABS
600.0000 mg | ORAL_TABLET | Freq: Four times a day (QID) | ORAL | Status: DC
  Administered 2020-03-26 (×3): 600 mg via ORAL
  Filled 2020-03-25 (×3): qty 1

## 2020-03-25 MED ORDER — PRENATAL MULTIVITAMIN CH
1.0000 | ORAL_TABLET | Freq: Every day | ORAL | Status: DC
  Administered 2020-03-26: 1 via ORAL
  Filled 2020-03-25: qty 1

## 2020-03-25 MED ORDER — SODIUM CHLORIDE (PF) 0.9 % IJ SOLN
INTRAMUSCULAR | Status: DC | PRN
  Administered 2020-03-25: 12 mL/h via EPIDURAL

## 2020-03-25 MED ORDER — FENTANYL-BUPIVACAINE-NACL 0.5-0.125-0.9 MG/250ML-% EP SOLN
EPIDURAL | Status: AC
  Filled 2020-03-25: qty 250

## 2020-03-25 MED ORDER — DIPHENHYDRAMINE HCL 50 MG/ML IJ SOLN: 12.5000 mg | INTRAMUSCULAR | Status: DC | PRN

## 2020-03-25 MED ORDER — ACETAMINOPHEN 325 MG PO TABS: 650.0000 mg | ORAL_TABLET | ORAL | Status: DC | PRN

## 2020-03-25 MED ORDER — WITCH HAZEL-GLYCERIN EX PADS: 1.0000 "application " | MEDICATED_PAD | CUTANEOUS | Status: DC | PRN

## 2020-03-25 MED ORDER — LACTATED RINGERS IV SOLN
500.0000 mL | Freq: Once | INTRAVENOUS | Status: AC
  Administered 2020-03-25: 500 mL via INTRAVENOUS

## 2020-03-25 MED ORDER — LIDOCAINE HCL (PF) 1 % IJ SOLN
INTRAMUSCULAR | Status: DC | PRN
  Administered 2020-03-25: 8 mL via EPIDURAL

## 2020-03-25 MED ORDER — FENTANYL CITRATE (PF) 100 MCG/2ML IJ SOLN
100.0000 ug | INTRAMUSCULAR | Status: DC | PRN
  Administered 2020-03-25: 100 ug via INTRAVENOUS
  Filled 2020-03-25: qty 2

## 2020-03-25 MED ORDER — MEASLES, MUMPS & RUBELLA VAC IJ SOLR: 0.5000 mL | Freq: Once | INTRAMUSCULAR | Status: DC

## 2020-03-25 MED ORDER — TETANUS-DIPHTH-ACELL PERTUSSIS 5-2.5-18.5 LF-MCG/0.5 IM SUSY: 0.5000 mL | PREFILLED_SYRINGE | Freq: Once | INTRAMUSCULAR | Status: DC

## 2020-03-25 MED ORDER — DIPHENHYDRAMINE HCL 25 MG PO CAPS: 25.0000 mg | ORAL_CAPSULE | Freq: Four times a day (QID) | ORAL | Status: DC | PRN

## 2020-03-25 MED ORDER — TERBUTALINE SULFATE 1 MG/ML IJ SOLN: 0.2500 mg | Freq: Once | INTRAMUSCULAR | Status: DC | PRN

## 2020-03-25 MED ORDER — SENNOSIDES-DOCUSATE SODIUM 8.6-50 MG PO TABS
2.0000 | ORAL_TABLET | ORAL | Status: DC
  Administered 2020-03-26: 2 via ORAL
  Filled 2020-03-25: qty 2

## 2020-03-25 NOTE — Discharge Instructions (Signed)

## 2020-03-25 NOTE — Discharge Summary (Signed)
Postpartum Discharge Summary     Patient Name: Peggy Savage DOB: [birth year] MRN: [id number]  Date of admission: 03/24/2020 Delivery date:03/25/2020  Delivering provider: Layla Barter  Date of discharge: 03/26/2020  Admitting diagnosis: Normal pregnancy [Z34.90] Intrauterine pregnancy: [redacted]w[redacted]d    Secondary diagnosis:  Active Problems:   Language barrier   Vaginal delivery   Supervision of other normal pregnancy, antepartum   Hx of shoulder dystocia in prior pregnancy, currently pregnant   Normal pregnancy   Thyroid function study abnormality  Additional problems: none    Discharge diagnosis: Term Pregnancy Delivered                                              Post partum procedures:none Augmentation: AROM, Pitocin, Cytotec and IP Foley Complications: None  Hospital course: Induction of Labor With Vaginal Delivery   29y.o. yo G3P2002 at 327w1das admitted to the hospital 03/24/2020 for induction of labor.  Indication for induction: DFM, NRNST.  Patient had an uncomplicated labor course as follows: Membrane Rupture Time/Date: 9:24 AM ,03/25/2020   Delivery Method:Vaginal, Spontaneous  Episiotomy: None  Lacerations:  None  Details of delivery can be found in separate delivery note.  Patient had a routine postpartum course. Patient is discharged home 03/26/20 per her request for early discharge.  Newborn Data: Birth date:03/25/2020  Birth time:11:18 AM  Gender:Female  Living status:Living  Apgars:9 ,9  Weight:3496 g (7lb 11.3oz)  Magnesium Sulfate received: No BMZ received: No Rhophylac:N/A MMR:N/A T-DaP:declined prenatally Flu: No Transfusion:No  Physical exam  Vitals:   03/25/20 1539 03/25/20 1927 03/26/20 0006 03/26/20 0335  BP: 118/71 116/71 (!) 105/54   Pulse: 77 75 (!) 58 61  Resp: '16 20 18 18  ' Temp: 98.2 F (36.8 C) (!) 97.4 F (36.3 C) 97.9 F (36.6 C) (!) 97.4 F (36.3 C)  TempSrc: Axillary Oral Oral Oral  SpO2: 100% 98%    Weight:       Height:       General: alert and cooperative Lochia: appropriate Uterine Fundus: firm Incision: N/A DVT Evaluation: No evidence of DVT seen on physical exam. Labs: Lab Results  Component Value Date   WBC 6.9 03/24/2020   HGB 11.0 (L) 03/24/2020   HCT 36.2 03/24/2020   MCV 82.5 03/24/2020   PLT 257 03/24/2020   CMP Latest Ref Rng & Units 09/09/2019  Glucose 65 - 99 mg/dL 112(H)  BUN 6 - 20 mg/dL 8  Creatinine 0.57 - 1.00 mg/dL 0.51(L)  Sodium 134 - 144 mmol/L 136  Potassium 3.5 - 5.2 mmol/L 3.7  Chloride 96 - 106 mmol/L 103  CO2 20 - 29 mmol/L 18(L)  Calcium 8.7 - 10.2 mg/dL 9.5  Total Protein 6.0 - 8.5 g/dL 7.2  Total Bilirubin 0.0 - 1.2 mg/dL <0.2  Alkaline Phos 48 - 121 IU/L 58  AST 0 - 40 IU/L 30  ALT 0 - 32 IU/L 48(H)   EdFlavia Shippercore: Edinburgh Postnatal Depression Scale Screening Tool 07/17/2017  I have been able to laugh and see the funny side of things. 0  I have looked forward with enjoyment to things. 1  I have blamed myself unnecessarily when things went wrong. 0  I have been anxious or worried for no good reason. 2  I have felt scared or panicky for no good reason. 0  Things have been  getting on top of me. 0  I have been so unhappy that I have had difficulty sleeping. 0  I have felt sad or miserable. 3  I have been so unhappy that I have been crying. 1  The thought of harming myself has occurred to me. 0  Edinburgh Postnatal Depression Scale Total 7     After visit meds:  Allergies as of 03/26/2020   No Known Allergies     Medication List    STOP taking these medications   Blood Pressure Kit Kit   metoCLOPramide 10 MG tablet Commonly known as: REGLAN     TAKE these medications   ibuprofen 600 MG tablet Commonly known as: ADVIL Take 1 tablet (600 mg total) by mouth every 6 (six) hours as needed.   Vitafol Ultra 29-0.6-0.4-200 MG Caps Take 1 tablet by mouth daily.        Discharge home in stable condition Infant Feeding:  Breast Infant Disposition:home with mother Discharge instruction: per After Visit Summary and Postpartum booklet. Activity: Advance as tolerated. Pelvic rest for 6 weeks.  Diet: routine diet Future Appointments: Future Appointments  Date Time Provider [place]  04/22/2020  3:15 PM Sloan Leiter, MD [place] None   Follow up Visit: Message sent to [facility] 03/25/20   Please schedule this patient for a In person postpartum visit in 4 weeks with the following provider: Any provider. Additional Postpartum F/U:none  Low risk pregnancy complicated by: none Delivery mode:  Vaginal, Spontaneous  Anticipated Birth Control:  Unsure   03/26/2020 Myrtis Ser, CNM

## 2020-03-25 NOTE — Progress Notes (Signed)
Labor Progress Note Peggy Savage is a 29 y.o. G3P2002 at [redacted]w[redacted]d presented for IOL for decreased fetal movement  S: Feeling some pelvic cramping   O:  BP 125/74   Pulse 67   Temp 98.5 F (36.9 C) (Oral)   Resp 16   Ht 5\' 9"  (1.753 m)   Wt 108.3 kg   LMP 06/06/2019 (Approximate)   BMI 35.26 kg/m  EFM: baseline 150/mod variability/pos accels/no decels   CVE: Dilation: Fingertip Effacement (%): Thick Station: -3 Presentation: Vertex Exam by:: Dr. Berniece Andreas   A&P: 29 y.o. [id number] [redacted]w[redacted]d admitted for IOL for Decreased fetal movement  #Labor: s/p cytotec x2. FB placed, will give third dose of cytotec now.  #Hx shoulder dystocia: Leopold's to 8.5 lbs, Pelvis tested to 9lbs 6oz. Dystocia precautions at delivery   #Pain: pain meds, epidural prn  #FWB: cat I  #GBS negative    Janet Berlin, MD 3:22 AM

## 2020-03-25 NOTE — Progress Notes (Signed)
Labor Progress Note Peggy Savage is a 29 y.o. G3P2002 at [redacted]w[redacted]d presented for IOL for decreased fetal movement  S:Patient has epidural in place. Resting comfortably in bed  O:  BP 117/75   Pulse 78   Temp (!) 97.4 F (36.3 C) (Oral)   Resp 18   Ht 5\' 9"  (1.753 m)   Wt 108.3 kg   LMP 06/06/2019 (Approximate)   SpO2 100%   BMI 35.26 kg/m  EFM: baseline 135/mod variability/pos accels/no decels   CVE: Dilation: 9 Effacement (%): 100 Station: 0 Presentation: Vertex Exam by:: Dr. Jaclynn Guarneri  A&P: 29 y.o. [id number] [redacted]w[redacted]d admitted for IOL for Decreased fetal movement  #Labor: AROM @ 0900. Epidural in place. Will let patient labor down and will begin pushing as patient feels more pressure.   #Hx shoulder dystocia: Leopold's to 8.5 lbs, Pelvis tested to 9lbs 6oz. Dystocia precautions at delivery.  #Pain: pain meds, epidural prn  #FWB: cat I  #GBS negative   Layla Barter, MD 9:45 AM

## 2020-03-25 NOTE — Anesthesia Procedure Notes (Signed)
Epidural Patient location during procedure: OB Start time: 03/25/2020 7:46 AM End time: 03/25/2020 7:50 AM  Staffing Anesthesiologist: Janeece Riggers, MD  Preanesthetic Checklist Completed: patient identified, IV checked, site marked, risks and benefits discussed, surgical consent, monitors and equipment checked, pre-op evaluation and timeout performed  Epidural Patient position: sitting Prep: DuraPrep and site prepped and draped Patient monitoring: continuous pulse ox and blood pressure Approach: midline Location: L3-L4 Injection technique: LOR air  Needle:  Needle type: Tuohy  Needle gauge: 17 G Needle length: 9 cm and 9 Needle insertion depth: 7 cm Catheter type: closed end flexible Catheter size: 19 Gauge Catheter at skin depth: 10 cm Test dose: negative  Assessment Events: blood not aspirated, injection not painful, no injection resistance, no paresthesia and negative IV test

## 2020-03-25 NOTE — Anesthesia Preprocedure Evaluation (Signed)
Anesthesia Evaluation  Patient identified by MRN, date of birth, ID band Patient awake    Reviewed: Allergy & Precautions, H&P , NPO status , Patient's Chart, lab work & pertinent test results, reviewed documented beta blocker date and time   Airway Mallampati: II  TM Distance: >3 FB Neck ROM: full    Dental no notable dental hx.    Pulmonary neg pulmonary ROS,    Pulmonary exam normal breath sounds clear to auscultation       Cardiovascular negative cardio ROS Normal cardiovascular exam Rhythm:regular Rate:Normal     Neuro/Psych negative neurological ROS  negative psych ROS   GI/Hepatic negative GI ROS, Neg liver ROS,   Endo/Other  negative endocrine ROS  Renal/GU negative Renal ROS  negative genitourinary   Musculoskeletal   Abdominal   Peds  Hematology  (+) Blood dyscrasia, anemia ,   Anesthesia Other Findings   Reproductive/Obstetrics (+) Pregnancy                             Anesthesia Physical Anesthesia Plan  ASA: III  Anesthesia Plan: Epidural   Post-op Pain Management:    Induction:   PONV Risk Score and Plan: 2  Airway Management Planned: Natural Airway  Additional Equipment:   Intra-op Plan:   Post-operative Plan:   Informed Consent: I have reviewed the patients History and Physical, chart, labs and discussed the procedure including the risks, benefits and alternatives for the proposed anesthesia with the patient or authorized representative who has indicated his/her understanding and acceptance.       Plan Discussed with: Anesthesiologist  Anesthesia Plan Comments:         Anesthesia Quick Evaluation

## 2020-03-25 NOTE — Anesthesia Postprocedure Evaluation (Signed)
Anesthesia Post Note  Patient: Kalina Montecalvo  Procedure(s) Performed: AN AD HOC LABOR EPIDURAL     Patient location during evaluation: Mother Baby Anesthesia Type: Epidural Level of consciousness: awake and alert Pain management: pain level controlled Vital Signs Assessment: post-procedure vital signs reviewed and stable Respiratory status: spontaneous breathing, nonlabored ventilation and respiratory function stable Cardiovascular status: stable Postop Assessment: no headache, no backache and epidural receding Anesthetic complications: no   No complications documented.  Last Vitals:  Vitals:   03/25/20 1215 03/25/20 1230  BP: 120/68 123/76  Pulse: 79 74  Resp: 18 18  Temp:    SpO2:      Last Pain:  Vitals:   03/25/20 1230  TempSrc:   PainSc: 0-No pain   Pain Goal: Patients Stated Pain Goal: 6 (03/25/20 0830)              Epidural/Spinal Function Cutaneous sensation: Vague (03/25/20 1300), Patient able to flex knees: Yes (03/25/20 1300), Patient able to lift hips off bed: Yes (03/25/20 1300), Back pain beyond tenderness at insertion site: No (03/25/20 1300), Progressively worsening motor and/or sensory loss: No (03/25/20 1300), Bowel and/or bladder incontinence post epidural: No (03/25/20 1300)  [place]uma Blucher

## 2020-03-25 NOTE — Lactation Note (Addendum)
This note was copied from a baby's chart. Lactation Consultation Note  Patient Name: Peggy Savage [id number] Date: 03/25/2020 Reason for consult: Initial assessment;Term;Other (Comment) (In [place] due hypothermia) P3, 6 hour term female infant in [facility] due hypothermia and infant with light meconium stained fluids. Mom with hx abnormal thyroid studies intrapartum. Infant had one void since birth. Per mom infant latched 4 times, she has had a little discomfort with latches, 1st -6 minutes, 2nd time-7 minutes, 3rd time-10 minutes and just prior to [facility] entering the room 5 minutes. LC did not see latch nor could assist with re-latching infant at this time due infant going to [employer].  Due low temp's ( hypothermia). Per mom, she BF her 1st child for only one week and 2nd child was breast and formula for 2 years. Mom taught back hand expression few drops of colostrum expressed, LC explained colostrum is in small amounts this is normal and infant's small tummy size.  Mom is active on the [facility] in [facility], she doesn't have breast pump at home.  Mom knows to BF infant according to cues, 8 to 12+ times within 24 hours, STS. Mom knows to call RN or LC if she needs assistance with latching infant at the breast. Mom will use DEBP for breast stimulation and help establish supply every 4 hours for 15 minutes on initial setting. Mom will give infant back any EBM from pumping after latching infant at the breast first.  LC explained that mom now has hand pump for home use that is in her DEBP kit and LC demonstrated to mom how to use pump.  Mom shown how to use DEBP & how to disassemble, clean, & reassemble parts. Mom made aware of O/P services, breastfeeding support groups, community resources, and our phone # for post-discharge questions.    Maternal Data Formula Feeding for Exclusion: Yes Reason for exclusion: Mother's choice to formula and breast feed on  admission Has patient been taught Hand Expression?: Yes Does the patient have breastfeeding experience prior to this delivery?: Yes  Feeding Feeding Type: Breast Fed  LATCH Score             Interventions Interventions: Breast feeding basics reviewed;Skin to skin;Hand express;Breast massage;DEBP  Lactation Tools Discussed/Used Tools: Pump;Flanges Flange Size: 27 Breast pump type: Double-Electric Breast Pump WIC Program: Yes Pump Review: Setup, frequency, and cleaning;Milk Storage Initiated by:: Vicente Serene, IBCLC Date initiated:: 03/25/20   Consult Status Consult Status: Follow-up Date: 03/26/20 Follow-up type: In-patient    Vicente Serene 03/25/2020, 5:57 PM

## 2020-03-26 MED ORDER — IBUPROFEN 600 MG PO TABS: 600.0000 mg | ORAL_TABLET | Freq: Four times a day (QID) | ORAL | 0 refills | Status: DC | PRN

## 2020-03-26 NOTE — Lactation Note (Signed)
This note was copied from a baby's chart. Lactation Consultation Note  Patient Name: Peggy Savage [id number] Date: 03/26/2020 Reason for consult: Follow-up assessment;Term  P3 mother whose infant is now 24 hours old.  This is a term baby at 39+1 weeks.  Mother's feeding preference is breast/bottle.  Mother had baby latched in the cradle hold when I arrived.  Mother had no questions/concerns related to breast feeding.  Baby had a good latch, however, still is somewhat sleepy during feeding.  Suggested mother feed STS and demonstrated gentle stimulation to keep her awake during feedings.  Mother denies pain with latching.  She will continue to feed on cue or at least 8-12 times/24 hours.  Engorgement prevention/treatment reviewed.  Mother has a manual pump and a DEBP for home use.  Support person present and asleep.  Mother has already been discharged and would like to go home today if pediatrician allows.  Baby has had a 5% weight loss since birth.  Mother aware that it would be early afternoon before discharge would be considered.  She will call me for any further concerns.   Maternal Data    Feeding Feeding Type: Breast Fed  LATCH Score Latch: Grasps breast easily, tongue down, lips flanged, rhythmical sucking.  Audible Swallowing: None  Type of Nipple: Everted at rest and after stimulation  Comfort (Breast/Nipple): Soft / non-tender  Hold (Positioning): No assistance needed to correctly position infant at breast.  LATCH Score: 8  Interventions Interventions: Breast feeding basics reviewed;Skin to skin  Lactation Tools Discussed/Used     Consult Status Consult Status: Follow-up Date: 03/27/20 Follow-up type: In-patient    [name] 03/26/2020, 8:11 AM

## 2020-03-26 NOTE — Progress Notes (Signed)
Post Partum Day 1 Subjective: no complaints, up ad lib, voiding, tolerating PO and breastfeeding well other than on the right side- babe not latching.  Objective: Blood pressure (!) 105/54, pulse 61, temperature (!) 97.4 F (36.3 C), temperature source Oral, resp. rate 18, height 5\' 9"  (1.753 m), weight 108.3 kg, last menstrual period 06/06/2019, SpO2 98 %, unknown if currently breastfeeding.  Physical Exam:  General: alert and cooperative Lochia: appropriate Uterine Fundus: firm Incision: NA DVT Evaluation: No evidence of DVT seen on physical exam.  Recent Labs    03/24/20 1740  HGB 11.0*  HCT 36.2    Assessment/Plan: Discharge home, Breastfeeding, Lactation consult and Contraception declined, has never used- will discuss at pp visit   LOS: 2 days   Peggy Savage 03/26/2020, 6:46 AM

## 2020-04-22 ENCOUNTER — Ambulatory Visit: Payer: Medicaid Other | Admitting: Obstetrics and Gynecology

## 2020-04-30 ENCOUNTER — Other Ambulatory Visit: Payer: Self-pay

## 2020-04-30 ENCOUNTER — Encounter: Payer: Self-pay | Admitting: Certified Nurse Midwife

## 2020-04-30 ENCOUNTER — Ambulatory Visit (INDEPENDENT_AMBULATORY_CARE_PROVIDER_SITE_OTHER): Payer: Medicaid Other | Admitting: Certified Nurse Midwife

## 2020-04-30 DIAGNOSIS — R42 Dizziness and giddiness: Secondary | ICD-10-CM

## 2020-04-30 DIAGNOSIS — R946 Abnormal results of thyroid function studies: Secondary | ICD-10-CM

## 2020-04-30 NOTE — Progress Notes (Signed)
Post Partum Visit Note  Peggy Savage is a 30 y.o. [id number] female who presents for a postpartum visit. She is 5 weeks postpartum following a normal spontaneous vaginal delivery.  I have fully reviewed the prenatal and intrapartum course. The delivery was at 39.1 gestational weeks.  Anesthesia: epidural. Postpartum course has been unremarkable. Baby is doing well. Baby is feeding by breast. Bleeding staining only. Bowel function is normal. Bladder function is normal. Patient is not sexually active. Contraception method is none. Postpartum depression screening: negative=0.   The pregnancy intention screening data noted above was reviewed. Potential methods of contraception were discussed. The patient elected to proceed with FAM or LAM.    Edinburgh Postnatal Depression Scale - 04/30/20 1115      Edinburgh Postnatal Depression Scale:  In the Past 7 Days   I have been able to laugh and see the funny side of things. 0    I have looked forward with enjoyment to things. 0    I have blamed myself unnecessarily when things went wrong. 0    I have been anxious or worried for no good reason. 0    I have felt scared or panicky for no good reason. 0    Things have been getting on top of me. 0    I have been so unhappy that I have had difficulty sleeping. 0    I have felt sad or miserable. 0    I have been so unhappy that I have been crying. 0    The thought of harming myself has occurred to me. 0    Edinburgh Postnatal Depression Scale Total 0             The following portions of the patient's history were reviewed and updated as appropriate: allergies, current medications, past medical history and problem list.  Review of Systems A comprehensive review of systems was negative.     Objective:  BP 119/73   Pulse 66   Wt 209 lb (94.8 kg)   LMP 06/06/2019 (Approximate)   Breastfeeding Yes   BMI 30.86 kg/m    General:  alert, cooperative and no distress   Breasts:  inspection  negative, no nipple discharge or bleeding, no masses or nodularity palpable  Lungs: clear to auscultation bilaterally  Heart:  regular rate and rhythm  Abdomen: soft, non-tender; bowel sounds normal; no masses,  no organomegaly   Vulva:  not evaluated  Vagina: not evaluated  Cervix:  not evaluated  Corpus: not examined  Adnexa:  not evaluated  Rectal Exam: Not performed.        Assessment:   1. Postpartum care following vaginal delivery -  Normal postpartum exam. Pap smear not done at today's visit. Pap completed on 08/2019 and was normal - next pap due 2026 due to patient being over 30   2. Dizziness - Patient reports occasional dizziness when she is up and moving around, denies syncope or any other symptoms  - CBC  3. Thyroid function study abnormality - Intrapartum abnormality, encouraged to have repeat completed today   - Currently not on medication - TSH - T3, free - T4, free   Plan:   Essential components of care per ACOG recommendations:  1.  Mood and well being: Patient with negative depression screening today. Reviewed local resources for support.  - Patient does not use tobacco. - hx of drug use? No   2. Infant care and feeding:  -Patient currently breastmilk feeding? No. Reviewed  importance of draining breast regularly to support lactation. -Social determinants of health (SDOH) reviewed in EPIC. No concerns  3. Sexuality, contraception and birth spacing - Patient does not want a pregnancy in the next year.  Desired family size is 3 children.  - Reviewed forms of contraception in tiered fashion. Patient desired natural family planning (NFP) today.   - Discussed birth spacing of 18 months  4. Sleep and fatigue -Encouraged family/partner/community support of 4 hrs of uninterrupted sleep to help with mood and fatigue  5. Physical Recovery  - Discussed patients delivery and complications - Patient has urinary incontinence? No - Patient is safe to resume  physical and sexual activity  6.  Health Maintenance - Last pap smear done 08/2019 and was normal with negative HPV.   Lajean Manes, [place] for [employer], [place]

## 2020-05-01 LAB — CBC
Hematocrit: 40.2 % (ref 34.0–46.6)
Hemoglobin: 12.8 g/dL (ref 11.1–15.9)
MCH: 26.1 pg — ABNORMAL LOW (ref 26.6–33.0)
MCHC: 31.8 g/dL (ref 31.5–35.7)
MCV: 82 fL (ref 79–97)
Platelets: 311 10*3/uL (ref 150–450)
RBC: 4.9 x10E6/uL (ref 3.77–5.28)
RDW: 17 % — ABNORMAL HIGH (ref 11.7–15.4)
WBC: 6.5 10*3/uL (ref 3.4–10.8)

## 2020-05-01 LAB — T3, FREE: T3, Free: 2.6 pg/mL (ref 2.0–4.4)

## 2020-05-01 LAB — T4, FREE: Free T4: 0.89 ng/dL (ref 0.82–1.77)

## 2020-05-01 LAB — TSH: TSH: 1.45 u[IU]/mL (ref 0.450–4.500)

## 2020-12-04 ENCOUNTER — Other Ambulatory Visit: Payer: Self-pay

## 2020-12-04 ENCOUNTER — Encounter (HOSPITAL_BASED_OUTPATIENT_CLINIC_OR_DEPARTMENT_OTHER): Payer: Self-pay | Admitting: *Deleted

## 2020-12-04 ENCOUNTER — Emergency Department (HOSPITAL_BASED_OUTPATIENT_CLINIC_OR_DEPARTMENT_OTHER)
Admission: EM | Admit: 2020-12-04 | Discharge: 2020-12-04 | Disposition: A | Discharge status: Home / Self Care | Payer: Medicaid Other | Attending: Emergency Medicine | Admitting: Emergency Medicine

## 2020-12-04 DIAGNOSIS — N390 Urinary tract infection, site not specified: Secondary | ICD-10-CM | POA: Diagnosis not present

## 2020-12-04 DIAGNOSIS — N12 Tubulo-interstitial nephritis, not specified as acute or chronic: Secondary | ICD-10-CM | POA: Diagnosis not present

## 2020-12-04 DIAGNOSIS — U071 COVID-19: Secondary | ICD-10-CM | POA: Diagnosis not present

## 2020-12-04 DIAGNOSIS — R300 Dysuria: Secondary | ICD-10-CM | POA: Diagnosis present

## 2020-12-04 LAB — URINALYSIS, MICROSCOPIC (REFLEX)

## 2020-12-04 LAB — URINALYSIS, ROUTINE W REFLEX MICROSCOPIC
Bilirubin Urine: NEGATIVE
Glucose, UA: NEGATIVE mg/dL
Hgb urine dipstick: NEGATIVE
Ketones, ur: NEGATIVE mg/dL
Nitrite: NEGATIVE
Protein, ur: NEGATIVE mg/dL
Specific Gravity, Urine: 1.025 (ref 1.005–1.030)
pH: 6 (ref 5.0–8.0)

## 2020-12-04 LAB — PREGNANCY, URINE: Preg Test, Ur: NEGATIVE

## 2020-12-04 MED ORDER — CEPHALEXIN 250 MG PO CAPS
500.0000 mg | ORAL_CAPSULE | Freq: Once | ORAL | Status: AC
  Administered 2020-12-04: 500 mg via ORAL
  Filled 2020-12-04: qty 2

## 2020-12-04 MED ORDER — CEPHALEXIN 500 MG PO CAPS: 500.0000 mg | ORAL_CAPSULE | Freq: Four times a day (QID) | ORAL | 0 refills | Status: AC

## 2020-12-04 NOTE — ED Triage Notes (Signed)
Dysuria. Back pain yesterday.

## 2020-12-04 NOTE — Discharge Instructions (Addendum)
Today you were given antibiotics. It is generally safe to take these while breastfeeding.   If you get fevers, worsening pain, short of breath, blood in your urine, vomiting or other concerns return to the ER.

## 2020-12-04 NOTE — ED Notes (Signed)
Pt. Reports she just returned from [place] 12 days ago.  Pt. Has been home 12 days and tested Positive for Covid when she returned home to the [place].  Pt. Reports she has been hurting in her bladder when she urinates for 10 days maybe 15 days when she pees.  Pt. Has no discharge and no itching and reports she burns when she urinates.

## 2020-12-04 NOTE — ED Provider Notes (Signed)
[facility] EMERGENCY DEPARTMENT Provider Note   CSN: [id number] Arrival date & time: 12/04/20  1740     History Chief Complaint  Patient presents with   Urinary Tract Infection    Peggy Savage is a 30 y.o. female who presents today for evaluation of intermittent dysuria. She states that she has burning.  Denies frequency or urgency.  She states that she has had urinary tract infections before and this feels similar. She denies any fevers.  She states that she has developed some right-sided back/flank pain since yesterday. She denies any abnormal vaginal discharge, no concern for STI/STD.  She denies any changes on her bowel movements.  She otherwise feels well.    She did test positive for covid 12 days ago.  No cough, shob, and she feels like she has recovered.   HPI     Past Medical History:  Diagnosis Date   Anemia    Supervision of other normal pregnancy, antepartum 11/21/2016    Nursing Staff Provider Office Location  [facility] Dating  LMP Language   Arabic Anatomy US  Normal Flu Vaccine  Declined 01/03/2020 Genetic Screen  NIPS: Low risks   AFP:  neg   TDaP vaccine   declined 01/03/2020 Hgb A1C or  GTT Early AIC 5.2   Third trimester normal Rhogam  n/a   LAB RESULTS  Covid-19 Vaccine  no Blood Type A/Positive/-- (05/24 1615)  Feeding Plan Breast  Antibody Negative (05/24 161   Vitamin D deficiency     Patient Active Problem List   Diagnosis Date Noted   Normal pregnancy 03/24/2020   Thyroid function study abnormality 03/24/2020   Hx of shoulder dystocia in prior pregnancy, currently pregnant 03/04/2020   Vitamin D deficiency 11/29/2016   Vaginal delivery 12/01/2015   Language barrier 11/10/2015    Past Surgical History:  Procedure Laterality Date   NO PAST SURGERIES       OB History     Gravida  3   Para  3   Term  3   Preterm  0   AB  0   Living  3      SAB  0   IAB  0   Ectopic  0   Multiple  0   Live Births  3            Family History  Problem Relation Age of Onset   Diabetes Mother    Hypertension Mother    Diabetes Father    Hypertension Father     Social History   Tobacco Use   Smoking status: Never   Smokeless tobacco: Never  Vaping Use   Vaping Use: Never used  Substance Use Topics   Alcohol use: No   Drug use: No    Home Medications Prior to Admission medications   Medication Sig Start Date End Date Taking? Authorizing Provider  cephALEXin (KEFLEX) 500 MG capsule Take 1 capsule (500 mg total) by mouth 4 (four) times daily for 10 days. 12/04/20 12/14/20 Yes Lorin Glass, PA-C  ibuprofen (ADVIL) 600 MG tablet Take 1 tablet (600 mg total) by mouth every 6 (six) hours as needed. 03/26/20   Myrtis Ser, CNM  Prenat-Fe Poly-Methfol-FA-DHA (VITAFOL ULTRA) 29-0.6-0.4-200 MG CAPS Take 1 tablet by mouth daily. 01/28/20   Cephas Darby, MD    Allergies    Patient has no known allergies.  Review of Systems   Review of Systems  Constitutional:  Negative for chills and fever.  HENT:  Negative for congestion.   Respiratory:  Negative for choking and shortness of breath.   Gastrointestinal:  Positive for abdominal pain.  Genitourinary:  Positive for dysuria and flank pain. Negative for pelvic pain, urgency, vaginal bleeding, vaginal discharge and vaginal pain.  Musculoskeletal:  Positive for back pain.  Neurological:  Negative for weakness.   Physical Exam Updated Vital Signs BP 106/67   Pulse 84   Temp 99.3 F (37.4 C) (Oral)   Resp 16   Ht '5\' 9"'$  (1.753 m)   Wt 94.8 kg   LMP 10/23/2020   SpO2 98%   BMI 30.86 kg/m   Physical Exam Vitals and nursing note reviewed.  Constitutional:      General: She is not in acute distress.    Appearance: She is not ill-appearing.  HENT:     Head: Atraumatic.  Eyes:     Conjunctiva/sclera: Conjunctivae normal.  Cardiovascular:     Rate and Rhythm: Normal rate.  Pulmonary:     Effort: Pulmonary effort is normal. No  respiratory distress.  Abdominal:     General: There is no distension.     Tenderness: There is no abdominal tenderness. There is right CVA tenderness (Tenderness with percussion, not palpation.). There is no left CVA tenderness or guarding.  Musculoskeletal:     Cervical back: Normal range of motion and neck supple.     Comments: No obvious acute injury  Skin:    General: Skin is warm.  Neurological:     Mental Status: She is alert.     Comments: Awake and alert, answers all questions appropriately.  Speech is not slurred.  Psychiatric:        Mood and Affect: Mood normal.        Behavior: Behavior normal.    ED Results / Procedures / Treatments   Labs (all labs ordered are listed, but only abnormal results are displayed) Labs Reviewed  URINALYSIS, ROUTINE W REFLEX MICROSCOPIC - Abnormal; Notable for the following components:      Result Value   Leukocytes,Ua TRACE (*)    All other components within normal limits  URINALYSIS, MICROSCOPIC (REFLEX) - Abnormal; Notable for the following components:   Bacteria, UA FEW (*)    All other components within normal limits  URINE CULTURE  PREGNANCY, URINE    EKG None  Radiology No results found.  Procedures Procedures   Medications Ordered in ED Medications  cephALEXin (KEFLEX) capsule 500 mg (500 mg Oral Given 12/04/20 2051)    ED Course  I have reviewed the triage vital signs and the nursing notes.  Pertinent labs & imaging results that were available during my care of the patient were reviewed by me and considered in my medical decision making (see chart for details).    MDM Rules/Calculators/A&P                           Patient is a otherwise healthy 30 year old woman who presents today for dysuria.  Since yesterday she has developed back pain.  Here she does not appear septic, is afebrile, not tachycardic or tachypnic, 98 percent on room air.   Her urine on microscopy showed last 0-5 reds, 11-20 whites with only few  bacteria and 0-5 squamous. She does have mild right-sided CVA tenderness to percussion not exacerbated with palpation.  Her pain has been minor, does not appear to be consistent with a kidney stone.   Medically she  does not appear septic and appears generally well. I did discuss options with her for further evaluation including lab work, and CAT scans.  She and I discussed risks, benefits, and alternatives.  At this point she wishes to trial antibiotic treatment.  I feel that this is a reasonable decision.  I did discuss with her options for interpreter.  She declined interpreter and was able to verify to me that she understood what we spoke about.  I did inform her that if at any point she wanted one we would be able to obtain one.   Urine culture sent.  Preg test is negative.  She is breast feeding.  Recommended increasing PO fluids, discussed antibiotic use while breastfeeding, both she and her daughter do not have any allergies, will treat with keflex.   Return precautions were discussed with patient who states their understanding.  At the time of discharge patient denied any unaddressed complaints or concerns.  Patient is agreeable for discharge home with close PCP follow up.   Note: Portions of this report may have been transcribed using voice recognition software. Every effort was made to ensure accuracy; however, inadvertent computerized transcription errors may be present     Final Clinical Impression(s) / ED Diagnoses Final diagnoses:  Pyelonephritis    Rx / DC Orders ED Discharge Orders          Ordered    cephALEXin (KEFLEX) 500 MG capsule  4 times daily        12/04/20 2028             Lorin Glass, Hershal Coria 12/04/20 2334    Drenda Freeze, MD 12/04/20 2337

## 2020-12-06 LAB — URINE CULTURE

## 2020-12-24 ENCOUNTER — Emergency Department (HOSPITAL_BASED_OUTPATIENT_CLINIC_OR_DEPARTMENT_OTHER)
Admission: EM | Admit: 2020-12-24 | Discharge: 2020-12-25 | Disposition: A | Discharge status: Home / Self Care | Payer: Medicaid Other | Attending: Emergency Medicine | Admitting: Emergency Medicine

## 2020-12-24 ENCOUNTER — Other Ambulatory Visit: Payer: Self-pay

## 2020-12-24 ENCOUNTER — Encounter (HOSPITAL_BASED_OUTPATIENT_CLINIC_OR_DEPARTMENT_OTHER): Payer: Self-pay

## 2020-12-24 ENCOUNTER — Emergency Department (HOSPITAL_BASED_OUTPATIENT_CLINIC_OR_DEPARTMENT_OTHER): Payer: Medicaid Other

## 2020-12-24 DIAGNOSIS — N12 Tubulo-interstitial nephritis, not specified as acute or chronic: Secondary | ICD-10-CM | POA: Diagnosis not present

## 2020-12-24 DIAGNOSIS — R109 Unspecified abdominal pain: Secondary | ICD-10-CM | POA: Diagnosis present

## 2020-12-24 LAB — URINALYSIS, ROUTINE W REFLEX MICROSCOPIC
Bilirubin Urine: NEGATIVE
Glucose, UA: NEGATIVE mg/dL
Hgb urine dipstick: NEGATIVE
Ketones, ur: NEGATIVE mg/dL
Nitrite: NEGATIVE
Protein, ur: NEGATIVE mg/dL
Specific Gravity, Urine: 1.03 (ref 1.005–1.030)
pH: 7 (ref 5.0–8.0)

## 2020-12-24 LAB — CBC WITH DIFFERENTIAL/PLATELET
Abs Immature Granulocytes: 0.02 10*3/uL (ref 0.00–0.07)
Basophils Absolute: 0 10*3/uL (ref 0.0–0.1)
Basophils Relative: 0 %
Eosinophils Absolute: 0.1 10*3/uL (ref 0.0–0.5)
Eosinophils Relative: 2 %
HCT: 36.2 % (ref 36.0–46.0)
Hemoglobin: 11.8 g/dL — ABNORMAL LOW (ref 12.0–15.0)
Immature Granulocytes: 0 %
Lymphocytes Relative: 40 %
Lymphs Abs: 2.8 10*3/uL (ref 0.7–4.0)
MCH: 28.1 pg (ref 26.0–34.0)
MCHC: 32.6 g/dL (ref 30.0–36.0)
MCV: 86.2 fL (ref 80.0–100.0)
Monocytes Absolute: 0.6 10*3/uL (ref 0.1–1.0)
Monocytes Relative: 8 %
Neutro Abs: 3.4 10*3/uL (ref 1.7–7.7)
Neutrophils Relative %: 50 %
Platelets: 293 10*3/uL (ref 150–400)
RBC: 4.2 MIL/uL (ref 3.87–5.11)
RDW: 14.8 % (ref 11.5–15.5)
WBC: 6.9 10*3/uL (ref 4.0–10.5)
nRBC: 0 % (ref 0.0–0.2)

## 2020-12-24 LAB — PREGNANCY, URINE: Preg Test, Ur: NEGATIVE

## 2020-12-24 LAB — URINALYSIS, MICROSCOPIC (REFLEX): RBC / HPF: NONE SEEN RBC/hpf (ref 0–5)

## 2020-12-24 LAB — BASIC METABOLIC PANEL
Anion gap: 7 (ref 5–15)
BUN: 15 mg/dL (ref 6–20)
CO2: 25 mmol/L (ref 22–32)
Calcium: 8.8 mg/dL — ABNORMAL LOW (ref 8.9–10.3)
Chloride: 104 mmol/L (ref 98–111)
Creatinine, Ser: 0.58 mg/dL (ref 0.44–1.00)
GFR, Estimated: >60 mL/min (ref >60)
Glucose, Bld: 90 mg/dL (ref 70–99)
Potassium: 3.3 mmol/L — ABNORMAL LOW (ref 3.5–5.1)
Sodium: 136 mmol/L (ref 135–145)

## 2020-12-24 MED ORDER — AMOXICILLIN-POT CLAVULANATE 875-125 MG PO TABS: 1.0000 | ORAL_TABLET | Freq: Two times a day (BID) | ORAL | 0 refills | Status: AC

## 2020-12-24 MED ORDER — SODIUM CHLORIDE 0.9 % IV SOLN
1.0000 g | Freq: Once | INTRAVENOUS | Status: AC
  Administered 2020-12-24: 1 g via INTRAVENOUS
  Filled 2020-12-24: qty 10

## 2020-12-24 MED ORDER — CIPROFLOXACIN HCL 500 MG PO TABS: 500.0000 mg | ORAL_TABLET | Freq: Two times a day (BID) | ORAL | 0 refills | Status: DC

## 2020-12-24 NOTE — ED Triage Notes (Signed)
Pt c/o bilat flank pain-states she was dx here with kidney infection-states she completed abx and is still having flank pain-NAD-steady gait

## 2020-12-24 NOTE — ED Provider Notes (Signed)
[facility] EMERGENCY DEPARTMENT Provider Note   CSN: [id number] Arrival date & time: 12/24/20  1858     History Chief Complaint  Patient presents with   Flank Pain   Interpreter was used during this visit. Peggy Savage is a 30 y.o. female. Patient presents with right-sided flank pain.  She was seen in the ED on August 19 where she was diagnosed with pyelonephritis.  She said that they did not do a CT scan at that time to check for kidney stones.  They sent her home with Keflex.  She says that her UTI symptoms have resolved however she is still having intermittent colicky flank pain on the right side.  She denies any fever or chills.  She denies any dysuria or hematuria.  Flank Pain Pertinent negatives include no chest pain, no abdominal pain, no headaches and no shortness of breath.      Past Medical History:  Diagnosis Date   Anemia    Supervision of other normal pregnancy, antepartum 11/21/2016    Nursing Staff Provider Office Location  [facility] Dating  LMP Language   Arabic Anatomy US  Normal Flu Vaccine  Declined 01/03/2020 Genetic Screen  NIPS: Low risks   AFP:  neg   TDaP vaccine   declined 01/03/2020 Hgb A1C or  GTT Early AIC 5.2   Third trimester normal Rhogam  n/a   LAB RESULTS  Covid-19 Vaccine  no Blood Type A/Positive/-- (05/24 1615)  Feeding Plan Breast  Antibody Negative (05/24 161   Vitamin D deficiency     Patient Active Problem List   Diagnosis Date Noted   Normal pregnancy 03/24/2020   Thyroid function study abnormality 03/24/2020   Hx of shoulder dystocia in prior pregnancy, currently pregnant 03/04/2020   Vitamin D deficiency 11/29/2016   Vaginal delivery 12/01/2015   Language barrier 11/10/2015    Past Surgical History:  Procedure Laterality Date   NO PAST SURGERIES       OB History     Gravida  3   Para  3   Term  3   Preterm  0   AB  0   Living  3      SAB  0   IAB  0   Ectopic  0   Multiple  0   Live Births  3            Family History  Problem Relation Age of Onset   Diabetes Mother    Hypertension Mother    Diabetes Father    Hypertension Father     Social History   Tobacco Use   Smoking status: Never   Smokeless tobacco: Never  Vaping Use   Vaping Use: Never used  Substance Use Topics   Alcohol use: No   Drug use: No    Home Medications Prior to Admission medications   Medication Sig Start Date End Date Taking? Authorizing Provider  amoxicillin-clavulanate (AUGMENTIN) 875-125 MG tablet Take 1 tablet by mouth every 12 (twelve) hours for 10 days. 12/24/20 01/03/21 Yes Mahmud Keithly, Adora Fridge, PA-C  ibuprofen (ADVIL) 600 MG tablet Take 1 tablet (600 mg total) by mouth every 6 (six) hours as needed. 03/26/20   Myrtis Ser, CNM  Prenat-Fe Poly-Methfol-FA-DHA (VITAFOL ULTRA) 29-0.6-0.4-200 MG CAPS Take 1 tablet by mouth daily. 01/28/20   Cephas Darby, MD    Allergies    Patient has no known allergies.  Review of Systems   Review of Systems  Constitutional:  Negative  for chills and fever.  HENT:  Negative for congestion and rhinorrhea.   Eyes:  Negative for visual disturbance.  Respiratory:  Negative for cough, chest tightness and shortness of breath.   Cardiovascular:  Negative for chest pain, palpitations and leg swelling.  Gastrointestinal:  Negative for abdominal pain, constipation, diarrhea, nausea and vomiting.  Genitourinary:  Positive for flank pain. Negative for difficulty urinating, dysuria and pelvic pain.  Musculoskeletal:  Negative for back pain.  Skin:  Negative for rash and wound.  Neurological:  Negative for dizziness, syncope, weakness, light-headedness and headaches.  All other systems reviewed and are negative.  Physical Exam Updated Vital Signs BP 125/88 (BP Location: Left Arm)   Pulse 60   Temp 98.5 F (36.9 C) (Oral)   Resp 18   Wt 96.3 kg   LMP 12/17/2020 (Approximate)   SpO2 99%   BMI 31.35 kg/m   Physical Exam Vitals and nursing note  reviewed.  Constitutional:      General: She is not in acute distress.    Appearance: Normal appearance. She is not ill-appearing, toxic-appearing or diaphoretic.  HENT:     Head: Normocephalic and atraumatic.  Eyes:     General: No scleral icterus.       Right eye: No discharge.        Left eye: No discharge.     Conjunctiva/sclera: Conjunctivae normal.  Pulmonary:     Effort: Pulmonary effort is normal. No respiratory distress.  Abdominal:     General: Abdomen is flat.     Tenderness: There is no abdominal tenderness. There is right CVA tenderness.  Skin:    General: Skin is warm and dry.  Neurological:     General: No focal deficit present.     Mental Status: She is alert and oriented to person, place, and time.  Psychiatric:        Mood and Affect: Mood normal.        Behavior: Behavior normal.    ED Results / Procedures / Treatments   Labs (all labs ordered are listed, but only abnormal results are displayed) Labs Reviewed  URINALYSIS, ROUTINE W REFLEX MICROSCOPIC - Abnormal; Notable for the following components:      Result Value   APPearance CLOUDY (*)    Leukocytes,Ua SMALL (*)    All other components within normal limits  URINALYSIS, MICROSCOPIC (REFLEX) - Abnormal; Notable for the following components:   Bacteria, UA MANY (*)    All other components within normal limits  BASIC METABOLIC PANEL - Abnormal; Notable for the following components:   Potassium 3.3 (*)    Calcium 8.8 (*)    All other components within normal limits  CBC WITH DIFFERENTIAL/PLATELET - Abnormal; Notable for the following components:   Hemoglobin 11.8 (*)    All other components within normal limits  URINE CULTURE  PREGNANCY, URINE    EKG None  Radiology CT Renal Stone Study  Result Date: 12/24/2020 CLINICAL DATA:  Bilateral flank pain. EXAM: CT ABDOMEN AND PELVIS WITHOUT CONTRAST TECHNIQUE: Multidetector CT imaging of the abdomen and pelvis was performed following the standard  protocol without IV contrast. COMPARISON:  CT abdomen and pelvis 05/31/2019. FINDINGS: Lower chest: No acute abnormality. Hepatobiliary: There is a 3 cm rounded lesion in the right lobe of the liver image 2/15 measuring 38 Hounsfield units, indeterminate. The liver otherwise appears within normal limits. The gallbladder is within normal limits. There is no biliary ductal dilatation. Pancreas: Unremarkable. No pancreatic ductal dilatation or surrounding  inflammatory changes. Spleen: Normal in size without focal abnormality. Adrenals/Urinary Tract: There is a 12 mm left renal cyst, unchanged. There is no evidence for urinary tract calculi or hydronephrosis. The kidneys are normal in size and contour. The bladder is within normal limits. Stomach/Bowel: Stomach is within normal limits. Appendix appears normal. No evidence of bowel wall thickening, distention, or inflammatory changes. Vascular/Lymphatic: No significant vascular findings are present. No enlarged abdominal or pelvic lymph nodes. Reproductive: Uterus and bilateral adnexa are unremarkable. Other: No abdominal wall hernia or abnormality. No abdominopelvic ascites. Musculoskeletal: No acute or significant osseous findings. IMPRESSION: 1. No evidence for urinary tract calculus or hydronephrosis. 2. 3 cm indeterminate liver lesion. A follow-up nonemergent MRI is recommended for further evaluation. Electronically Signed   By: Ronney Asters M.D.   On: 12/24/2020 23:15    Procedures Procedures   Medications Ordered in ED Medications  cefTRIAXone (ROCEPHIN) 1 g in sodium chloride 0.9 % 100 mL IVPB (1 g Intravenous New Bag/Given 12/24/20 2326)    ED Course  I have reviewed the triage vital signs and the nursing notes.  Pertinent labs & imaging results that were available during my care of the patient were reviewed by me and considered in my medical decision making (see chart for details).    MDM Rules/Calculators/A&P                         Patient  failed outpatient treatment with Keflex for pyelonephritis.  She presents to our ED in no acute distress.  She is afebrile with vital signs are stable.  Exam is notable for right CVA tenderness however otherwise unremarkable.  Her urine return with large amount of bacteria.  We will resend culture to determine antibiotic resistance.  Reviewed previous culture and it showed that there were several types of bacteria and was recommended to repeat the culture.  I am not sure that Keflex was the appropriate antibiotic for her in the setting of pyelonephritis.  With concern for continued pyelonephritis, will give 1 IV dose of Rocephin.  Due to colicky nature of patient's pain, I also obtained CT stone study to make sure she does not have any kidney stones.  CT stone study reviewed with no evidence of ureteral stone.  Labs reviewed with no notable leukocytosis.  I do not see any reason for inpatient mission at this time.  I will discharge patient home on Augmentin.  Reviewed this medication and it is compatible with breast-feeding.  Patient is given return precautions if she is not getting any better.  If she develops a fever, chills, chest pain, shortness of breath she should return to the emergency department.  She is to follow-up with her PCP within 1 week to determine if this is still an ongoing issue.     Final Clinical Impression(s) / ED Diagnoses Final diagnoses:  Pyelonephritis    Rx / DC Orders ED Discharge Orders          Ordered    ciprofloxacin (CIPRO) 500 MG tablet  Every 12 hours,   Status:  Discontinued        12/24/20 2259    amoxicillin-clavulanate (AUGMENTIN) 875-125 MG tablet  Every 12 hours        12/24/20 2328             Adolphus Birchwood, PA-C 12/25/20 0014    Regan Lemming, MD 12/25/20 1208

## 2020-12-24 NOTE — Discharge Instructions (Addendum)
You have been prescribed an antibiotic. This should be compatible with breastfeeding. Please complete the course of antibiotics. Please return if your symptoms do not improve or if you develop fevers, shortness of breath, or chest pain.   Please schedule appointment with your primary care doctor within the next week.  If you do not have a primary care doctor please call the [facility] try to get set up with someone.  Please take all of your antibiotics until finished!   You may develop abdominal discomfort or diarrhea from the antibiotic.  You may help offset this with probiotics which you can buy or get in yogurt. Do not eat  or take the probiotics until 2 hours after your antibiotic.

## 2020-12-24 NOTE — ED Notes (Addendum)
Pt states she completed antibiotics 2 weeks ago and symptoms of UTI have returned and right flank pain.  Urine sent in triage

## 2020-12-26 LAB — URINE CULTURE

## 2021-01-25 ENCOUNTER — Encounter (HOSPITAL_COMMUNITY): Payer: Self-pay | Admitting: Radiology

## 2021-02-03 ENCOUNTER — Other Ambulatory Visit: Payer: Self-pay | Admitting: Family Medicine

## 2021-02-03 DIAGNOSIS — R932 Abnormal findings on diagnostic imaging of liver and biliary tract: Secondary | ICD-10-CM

## 2021-02-27 ENCOUNTER — Other Ambulatory Visit: Payer: Self-pay

## 2021-02-27 ENCOUNTER — Ambulatory Visit
Admission: RE | Admit: 2021-02-27 | Discharge: 2021-02-27 | Disposition: A | Discharge status: Home / Self Care | Payer: Medicaid Other | Source: Ambulatory Visit | Attending: Family Medicine | Admitting: Family Medicine

## 2021-02-27 DIAGNOSIS — R932 Abnormal findings on diagnostic imaging of liver and biliary tract: Secondary | ICD-10-CM

## 2021-02-27 MED ORDER — GADOBENATE DIMEGLUMINE 529 MG/ML IV SOLN
10.0000 mL | Freq: Once | INTRAVENOUS | Status: AC | PRN
  Administered 2021-02-27: 10 mL via INTRAVENOUS

## 2021-07-21 DIAGNOSIS — M722 Plantar fascial fibromatosis: Secondary | ICD-10-CM | POA: Insufficient documentation

## 2021-07-21 DIAGNOSIS — K5909 Other constipation: Secondary | ICD-10-CM | POA: Insufficient documentation

## 2022-02-11 DIAGNOSIS — H539 Unspecified visual disturbance: Secondary | ICD-10-CM | POA: Insufficient documentation

## 2022-03-04 ENCOUNTER — Encounter: Payer: Self-pay | Admitting: Obstetrics and Gynecology

## 2022-03-04 ENCOUNTER — Ambulatory Visit (INDEPENDENT_AMBULATORY_CARE_PROVIDER_SITE_OTHER): Payer: Medicaid Other | Admitting: General Practice

## 2022-03-04 VITALS — BP 115/74 | HR 76 | Ht 66.93 in | Wt 205.3 lb

## 2022-03-04 DIAGNOSIS — N912 Amenorrhea, unspecified: Secondary | ICD-10-CM | POA: Diagnosis not present

## 2022-03-04 DIAGNOSIS — Z3201 Encounter for pregnancy test, result positive: Secondary | ICD-10-CM | POA: Diagnosis not present

## 2022-03-04 DIAGNOSIS — Z3200 Encounter for pregnancy test, result unknown: Secondary | ICD-10-CM

## 2022-03-04 LAB — POCT URINE PREGNANCY: Preg Test, Ur: POSITIVE — AB

## 2022-03-04 NOTE — Progress Notes (Signed)
Peggy Savage presents today for UPT. She has no unusual complaints. LMP:01-22-22   Pt states she has a liver hemangioma and has concerns about this affecting her pregnancy. Pt advised to make an appt to see a provider.   OBJECTIVE: Appears well, in no apparent distress.  OB History     Gravida  3   Para  3   Term  3   Preterm  0   AB  0   Living  3      SAB  0   IAB  0   Ectopic  0   Multiple  0   Live Births  3          Home UPT Result: Positive  In-Office UPT result: Positive  I have reviewed the patient's medical, obstetrical, social, and family histories, and medications.   ASSESSMENT: Positive pregnancy test  PLAN Prenatal care to be completed at: [facility]

## 2022-03-24 ENCOUNTER — Telehealth (INDEPENDENT_AMBULATORY_CARE_PROVIDER_SITE_OTHER): Payer: Medicaid Other | Admitting: Obstetrics and Gynecology

## 2022-03-24 ENCOUNTER — Encounter: Payer: Self-pay | Admitting: Obstetrics and Gynecology

## 2022-03-24 DIAGNOSIS — D1803 Hemangioma of intra-abdominal structures: Secondary | ICD-10-CM | POA: Diagnosis not present

## 2022-03-24 NOTE — Progress Notes (Signed)
S/w pt for virtual visit and pt states that she wants to discuss how her liver diagnosis will affect her pregnancy.

## 2022-03-24 NOTE — Progress Notes (Signed)
   OBSTETRICS PRENATAL VIRTUAL VISIT ENCOUNTER NOTE  Provider location: Center for [place] at [facility]   Patient location: Home  I connected with Peggy Savage on 03/24/22 at  2:50 PM EST by MyChart Video Encounter and verified that I am speaking with the correct person using two identifiers. I discussed the limitations, risks, security and privacy concerns of performing an evaluation and management service virtually and the availability of in person appointments. I also discussed with the patient that there may be a patient responsible charge related to this service. The patient expressed understanding and agreed to proceed. Subjective:  Peggy Savage is a 31 y.o. [id number] at31 yo [id number] at 25w6dby LMP 01/22/2022.  Patient is scheduled for NOB intake on 12/14 and has Language barrier; Vaginal delivery; Vitamin D deficiency; Hx of shoulder dystocia in prior pregnancy, currently pregnant; Normal pregnancy; and Thyroid function study abnormality on their problem list.  Patient with incidental finding of liver hemangioma which has been stable since 2021. Patient was informed by Gi that they tend to grow in pregnancy and expressed concerns of safety with pregnancy   The following portions of the patient's history were reviewed and updated as appropriate: allergies, current medications, past family history, past medical history, past social history, past surgical history and problem list.   Objective:  There were no vitals filed for this visit.  Fetal Status:           General:  Alert, oriented and cooperative. Patient is in no acute distress.  Respiratory: Normal respiratory effort, no problems with respiration noted  Mental Status: Normal mood and affect. Normal behavior. Normal judgment and thought content.  Rest of physical exam deferred due to type of encounter  Imaging: No results found.  Assessment and Plan:  Pregnancy: [id number] Unknown 1. Liver  hemangioma Informed patient that liver hemangioma are benign. They can grow in pregnancy as a response to the estrogen effect but typically remain asymptomatic and cause no harm to mother or baby Patient reassured Follow up for new ob intake and ultrasound First trimester precautions reviewed  Preterm labor symptoms and general obstetric precautions including but not limited to vaginal bleeding, contractions, leaking of fluid and fetal movement were reviewed in detail with the patient. I discussed the assessment and treatment plan with the patient. The patient was provided an opportunity to ask questions and all were answered. The patient agreed with the plan and demonstrated an understanding of the instructions. The patient was advised to call back or seek an in-person office evaluation/[facility] concerning [facility]. Please refer to After Visit Summary for other counseling recommendations.   I provided 15 minutes of face-to-face time during this encounter.  No follow-ups on file.  Future Appointments  Date Time Provider [place] 03/31/2022 10:15 AM [place]  05/05/2022  9:15 AM Gwen Sarvis, PVickii Chafe MD [facility] None    PMora Bellman MD [facility] [place]

## 2022-04-01 ENCOUNTER — Ambulatory Visit: Payer: Medicaid Other | Admitting: *Deleted

## 2022-04-01 ENCOUNTER — Ambulatory Visit (INDEPENDENT_AMBULATORY_CARE_PROVIDER_SITE_OTHER): Payer: Medicaid Other

## 2022-04-01 VITALS — BP 123/76 | HR 71 | Ht 66.93 in | Wt 213.9 lb

## 2022-04-01 DIAGNOSIS — Z3480 Encounter for supervision of other normal pregnancy, unspecified trimester: Secondary | ICD-10-CM

## 2022-04-01 DIAGNOSIS — Z3481 Encounter for supervision of other normal pregnancy, first trimester: Secondary | ICD-10-CM | POA: Diagnosis not present

## 2022-04-01 DIAGNOSIS — O3680X0 Pregnancy with inconclusive fetal viability, not applicable or unspecified: Secondary | ICD-10-CM

## 2022-04-01 DIAGNOSIS — Z3A01 Less than 8 weeks gestation of pregnancy: Secondary | ICD-10-CM | POA: Diagnosis not present

## 2022-04-01 MED ORDER — PREPLUS 27-1 MG PO TABS: 1.0000 | ORAL_TABLET | Freq: Every day | ORAL | 13 refills | Status: DC

## 2022-04-01 NOTE — Progress Notes (Signed)
New OB Intake  I connected withNAME@ on 04/01/22 at  8:15 AM EST by In Person Visit and verified that I am speaking with the correct person using two identifiers. Nurse is located at [facility] and pt is located at [place].  I discussed the limitations, risks, security and privacy concerns of performing an evaluation and management service by telephone and the availability of in person appointments. I also discussed with the patient that there may be a patient responsible charge related to this service. The patient expressed understanding and agreed to proceed.  I explained I am completing New OB Intake today. We discussed EDD is unclear based on today's [place]. Gestational sac, yolk sac and amnion visualized. Gestational sac measurements inconsistent with LMP of 01/22/22. Repeat US planned in 2 weeks Pt is G4/P3. I reviewed her allergies, medications, Medical/Surgical/OB history, and appropriate screenings. I informed her of [facility] services. [facility] information placed in AVS. Based on history, this is a low risk pregnancy.  Patient Active Problem List   Diagnosis Date Noted   Normal pregnancy 03/24/2020   Thyroid function study abnormality 03/24/2020   Hx of shoulder dystocia in prior pregnancy, currently pregnant 03/04/2020   Vitamin D deficiency 11/29/2016   Vaginal delivery 12/01/2015   Language barrier 11/10/2015    Concerns addressed today  Delivery Plans Plans to deliver at [facility]. Patient given information for [facility] Healthy Baby website for more information about Women's and [place]. Patient is not interested in water birth. Offered upcoming OB visit with CNM to discuss further.  MyChart/Babyscripts MyChart access verified. I explained pt will have some visits in office and some virtually. Babyscripts instructions given and order placed. Patient verifies receipt of registration text/e-mail. Account successfully created and app downloaded.  Blood Pressure Cuff/Weight Scale Has BP  cuff at home. Explained after first prenatal appt pt will check weekly and document in 29. Patient does not have weight scale; patient may purchase if they desire to track weight weekly in Babyscripts.  Anatomy US Explained first scheduled [place] will be around 19 weeks. Anatomy US scheduled for 19 wks at [facility]. Pt notified to arrive at [facility].  Labs Discussed Johnsie Cancel genetic screening with patient. Would like both Panorama and Horizon drawn at new OB visit. Routine prenatal labs needed.  COVID Vaccine Patient has not had COVID vaccine.  Social Determinants of Health Food Insecurity: Patient denies food insecurity. WIC Referral: Patient is interested in referral to [facility].  Transportation: Patient denies transportation needs. Childcare: Discussed no children allowed at ultrasound appointments. Offered childcare services; patient declines childcare services at this time.  First visit review I reviewed new OB appt with patient. I explained they will have a provider visit that includes labs. Explained pt will be seen by Dr. Elly Modena at first visit; encounter routed to appropriate provider. Explained that patient will be seen by pregnancy navigator following visit with provider.   Penny Pia, RN 04/01/2022  8:15 AM

## 2022-04-02 ENCOUNTER — Inpatient Hospital Stay (HOSPITAL_COMMUNITY)
Admission: AD | Admit: 2022-04-02 | Discharge: 2022-04-02 | Disposition: A | Discharge status: Home / Self Care | Payer: Medicaid Other | Attending: Obstetrics & Gynecology | Admitting: Obstetrics & Gynecology

## 2022-04-02 ENCOUNTER — Encounter (HOSPITAL_COMMUNITY): Payer: Self-pay | Admitting: Obstetrics & Gynecology

## 2022-04-02 DIAGNOSIS — O3680X0 Pregnancy with inconclusive fetal viability, not applicable or unspecified: Secondary | ICD-10-CM

## 2022-04-02 DIAGNOSIS — Z3A10 10 weeks gestation of pregnancy: Secondary | ICD-10-CM | POA: Diagnosis not present

## 2022-04-02 DIAGNOSIS — O200 Threatened abortion: Secondary | ICD-10-CM | POA: Insufficient documentation

## 2022-04-02 DIAGNOSIS — Z3A01 Less than 8 weeks gestation of pregnancy: Secondary | ICD-10-CM

## 2022-04-02 NOTE — MAU Note (Signed)
.  Peggy Savage is a 31 y.o. at 78w0dhere in MAU reporting: stated she had some spotting yesterday  and had U/S and had more bleeding after the u/s and more bleeding today with some cramping LMP: 01/22/22 Onset of complaint: yesterday Pain score: 5-6 Vitals:   04/02/22 1121  BP: 123/67  Pulse: 72  Resp: 18  Temp: 98.1 F (36.7 C)     FHT:n/a Lab orders placed from triage:

## 2022-04-02 NOTE — Discharge Instructions (Signed)
Return to care  If you have heavier bleeding that soaks through more than 2 pads per hour for an hour or more If you bleed so much that you feel like you might pass out or you do pass out If you have significant abdominal pain that is not improved with Tylenol   

## 2022-04-02 NOTE — MAU Provider Note (Signed)
History     [id number]  Arrival date and time: 04/02/22 1058    Chief Complaint  Patient presents with   Vaginal Bleeding     HPI Peggy Savage is a 31 y.o. at 89w0dwho presents for vaginal bleeding. Had ultrasound in the office yesterday that saw gestational sac & yolk sac. She is scheduled for viability scan in 2 weeks. Reports spotting yesterday with slightly heavier bleeding today. Has been wearing a pad but only saw a thin line of blood on the pad. Not saturating pads or passing clots. Denies abdominal pain.   OB History     Gravida  4   Para  3   Term  3   Preterm  0   AB  0   Living  3      SAB  0   IAB  0   Ectopic  0   Multiple  0   Live Births  3           Past Medical History:  Diagnosis Date   Anemia    Vitamin D deficiency     Past Surgical History:  Procedure Laterality Date   NO PAST SURGERIES      Family History  Problem Relation Age of Onset   Diabetes Father    Hypertension Father    Diabetes Mother    Hypertension Mother    Heart attack Mother    Heart disease Mother     No Known Allergies  No current facility-administered medications on file prior to encounter.   Current Outpatient Medications on File Prior to Encounter  Medication Sig Dispense Refill   Prenatal Vit-Fe Fumarate-FA (PREPLUS) 27-1 MG TABS Take 1 tablet by mouth daily. 30 tablet 13     ROS Pertinent positives and negative per HPI, all others reviewed and negative  Physical Exam   BP (!) 148/88   Pulse 88   Temp 98.1 F (36.7 C)   Resp 18   Ht '5\' 6"'$  (1.676 m)   LMP 01/22/2022   BMI 34.52 kg/m   Patient Vitals for the past 24 hrs:  BP Temp Pulse Resp Height  04/02/22 1246 (!) 148/88 -- 88 -- --  04/02/22 1121 123/67 98.1 F (36.7 C) 72 18 '5\' 6"'$  (1.676 m)    Physical Exam Vitals and nursing note reviewed.  Constitutional:      General: She is not in acute distress.    Appearance: Normal appearance.  HENT:     Head:  Normocephalic and atraumatic.  Eyes:     General: No scleral icterus.    Conjunctiva/sclera: Conjunctivae normal.  Pulmonary:     Effort: Pulmonary effort is normal. No respiratory distress.  Abdominal:     Palpations: Abdomen is soft.     Tenderness: There is no abdominal tenderness.  Skin:    General: Skin is warm and dry.  Neurological:     Mental Status: She is alert.       Bedside Ultrasound Pt informed that the ultrasound is considered a limited OB ultrasound and is not intended to be a complete ultrasound exam.  Patient also informed that the ultrasound is not being completed with the intent of assessing for fetal or placental anomalies or any pelvic abnormalities.  Explained that the purpose of today's ultrasound is to assess for  viability.  Patient acknowledges the purpose of the exam and the limitations of the study.   My interpretation: IUGS with YS. Fetal pole measuring 3.7 mm. No  cardiac activity identified.     Labs No results found for this or any previous visit (from the past 24 hour(s)).  Imaging No results found.  MAU Course  Procedures Lab Orders  No laboratory test(s) ordered today   No orders of the defined types were placed in this encounter.  Imaging Orders  No imaging studies ordered today    MDM Patient had young child with her & no one to watch her so unable to send for formal ultraound.  Had ultrasound in office yesterday that confirmed IUP BSUS today shows IUP still present - viability uncertain. Measuring ~6 weeks. Patient should be 10 weeks by LMP. Discussed with patient that currently uncertain of future of pregnancy - either earlier than thought or this is a failed pregnancy.  She is RH positive  Assessment and Plan   1. Pregnancy with uncertain fetal viability, single or unspecified fetus   2. Threatened miscarriage   3. [redacted] weeks gestation of pregnancy    -Reviewed bleeding/SAB precautions & reasons to return to MAU -Keep scheduled  appt for viability scan  Jorje Guild, NP 04/02/22 3:06 PM

## 2022-04-03 ENCOUNTER — Encounter (HOSPITAL_COMMUNITY): Payer: Self-pay | Admitting: Obstetrics & Gynecology

## 2022-04-03 ENCOUNTER — Inpatient Hospital Stay (HOSPITAL_COMMUNITY)
Admission: AD | Admit: 2022-04-03 | Discharge: 2022-04-03 | Disposition: A | Discharge status: Home / Self Care | Payer: Medicaid Other | Attending: Obstetrics & Gynecology | Admitting: Obstetrics & Gynecology

## 2022-04-03 ENCOUNTER — Inpatient Hospital Stay (HOSPITAL_COMMUNITY): Payer: Medicaid Other

## 2022-04-03 DIAGNOSIS — Z3A10 10 weeks gestation of pregnancy: Secondary | ICD-10-CM | POA: Diagnosis not present

## 2022-04-03 DIAGNOSIS — O26891 Other specified pregnancy related conditions, first trimester: Secondary | ICD-10-CM | POA: Diagnosis present

## 2022-04-03 DIAGNOSIS — Z3480 Encounter for supervision of other normal pregnancy, unspecified trimester: Secondary | ICD-10-CM

## 2022-04-03 DIAGNOSIS — O039 Complete or unspecified spontaneous abortion without complication: Secondary | ICD-10-CM | POA: Diagnosis not present

## 2022-04-03 DIAGNOSIS — R109 Unspecified abdominal pain: Secondary | ICD-10-CM | POA: Diagnosis not present

## 2022-04-03 DIAGNOSIS — Z6790 Unspecified blood type, Rh positive: Secondary | ICD-10-CM | POA: Diagnosis not present

## 2022-04-03 LAB — CBC
HCT: 34.6 % — ABNORMAL LOW (ref 36.0–46.0)
Hemoglobin: 11.6 g/dL — ABNORMAL LOW (ref 12.0–15.0)
MCH: 28.9 pg (ref 26.0–34.0)
MCHC: 33.5 g/dL (ref 30.0–36.0)
MCV: 86.1 fL (ref 80.0–100.0)
Platelets: 277 10*3/uL (ref 150–400)
RBC: 4.02 MIL/uL (ref 3.87–5.11)
RDW: 14.5 % (ref 11.5–15.5)
WBC: 9.6 10*3/uL (ref 4.0–10.5)
nRBC: 0 % (ref 0.0–0.2)

## 2022-04-03 LAB — TYPE AND SCREEN
ABO/RH(D): A POS
Antibody Screen: NEGATIVE

## 2022-04-03 LAB — HCG, QUANTITATIVE, PREGNANCY: hCG, Beta Chain, Quant, S: 15151 m[IU]/mL — ABNORMAL HIGH (ref <5)

## 2022-04-03 MED ORDER — HYDROCODONE-ACETAMINOPHEN 5-325 MG PO TABS
2.0000 | ORAL_TABLET | Freq: Once | ORAL | Status: AC
  Administered 2022-04-03: 2 via ORAL
  Filled 2022-04-03: qty 2

## 2022-04-03 MED ORDER — IBUPROFEN 600 MG PO TABS: 600.0000 mg | ORAL_TABLET | Freq: Four times a day (QID) | ORAL | 0 refills | Status: DC | PRN

## 2022-04-03 MED ORDER — HYDROCODONE-ACETAMINOPHEN 5-325 MG PO TABS: 2.0000 | ORAL_TABLET | ORAL | 0 refills | Status: AC | PRN

## 2022-04-03 MED ORDER — PROMETHAZINE HCL 12.5 MG PO TABS: 12.5000 mg | ORAL_TABLET | Freq: Four times a day (QID) | ORAL | 0 refills | Status: DC | PRN

## 2022-04-03 NOTE — MAU Provider Note (Signed)
History     CSN: [id number]  Arrival date and time: 04/03/22 1543   Event Date/Time   First Provider Initiated Contact with Patient 04/03/22 1742      Chief Complaint  Patient presents with   Vaginal Bleeding   Abdominal Pain   HPI Peggy Savage is a 31 y.o. G4P3003 at 68w1dwho presents to MAU with chief complaints of abdominal pain and vaginal bleeding. Patient was evaluated in MAU for comparably minor spotting yesterday 04/02/2022. She states her heavy vaginal bleeding started last night and has continued throughout the day today. She has visualized small clots. She denies weakness, dizziness and syncope.  Patient also c/o lower abdominal cramping. Onset coincides with onset of bleeding overnight and throughout the day today. Pain score is 7/10 on arrival to MAU. She declined pain medication on arrival to MAU with the plan to give pain medication following her ultrasound.  Patient has received care with [facility].   OB History     Gravida  4   Para  3   Term  3   Preterm  0   AB  0   Living  3      SAB  0   IAB  0   Ectopic  0   Multiple  0   Live Births  3           Past Medical History:  Diagnosis Date   Anemia    Vitamin D deficiency     Past Surgical History:  Procedure Laterality Date   NO PAST SURGERIES      Family History  Problem Relation Age of Onset   Diabetes Father    Hypertension Father    Diabetes Mother    Hypertension Mother    Heart attack Mother    Heart disease Mother     Social History   Tobacco Use   Smoking status: Never   Smokeless tobacco: Never  Vaping Use   Vaping Use: Never used  Substance Use Topics   Alcohol use: No   Drug use: No    Allergies: No Known Allergies  Medications Prior to Admission  Medication Sig Dispense Refill Last Dose   Prenatal Vit-Fe Fumarate-FA (PREPLUS) 27-1 MG TABS Take 1 tablet by mouth daily. 30 tablet 13     Review of Systems  Gastrointestinal:  Positive for  abdominal pain.  Genitourinary:  Positive for vaginal bleeding.  All other systems reviewed and are negative.  Physical Exam   Blood pressure 120/69, pulse 81, temperature 98.1 F (36.7 C), temperature source Oral, resp. rate 18, height '5\' 6"'$  (1.676 m), weight 94.6 kg, last menstrual period 01/22/2022, SpO2 99 %, currently breastfeeding.  Physical Exam Vitals and nursing note reviewed. Exam conducted with a chaperone present.  Constitutional:      Appearance: She is well-developed.  Cardiovascular:     Rate and Rhythm: Normal rate.  Pulmonary:     Effort: Pulmonary effort is normal.  Abdominal:     Palpations: Abdomen is soft.     Tenderness: There is no abdominal tenderness.  Genitourinary:    Comments: Moderate dark red clot dislodged with gentle manipulation of speculum Skin:    Capillary Refill: Capillary refill takes less than 2 seconds.  Neurological:     Mental Status: She is alert and oriented to person, place, and time.  Psychiatric:        Mood and Affect: Mood normal.        Behavior: Behavior normal.  MAU Course  Procedures  MDM  --Patient with MAU encounter 04/02/2022. Live IUP visualized on bedside at that time.   --1745: CNM returned to bedside. [place] results reviewed. Now requesting pain rx. Will re-accomplish spec exam to assess bleeding prior to discharge  --1830: CNM returned to bedside. Speculum placed. Small clot dislodged with gentle manipulation of speculum. Clot shown to patient as example of possible bleeding, looks large but does not saturate pad and is improvement from my first pelvic exam  Patient Vitals for the past 24 hrs:  BP Temp Temp src Pulse Resp SpO2 Height Weight  04/03/22 1607 120/69 98.1 F (36.7 C) Oral 81 18 99 % '5\' 6"'$  (1.676 m) 94.6 kg   Results for orders placed or performed during the hospital encounter of 04/03/22 (from the past 24 hour(s))  Type and screen [place]     Status: None   Collection Time:  04/03/22  4:24 PM  Result Value Ref Range   ABO/RH(D) A POS    Antibody Screen NEG    Sample Expiration      04/06/2022,2359 Performed at [facility], [place] [address], [place], [place] [address]   CBC     Status: Abnormal   Collection Time: 04/03/22  4:28 PM  Result Value Ref Range   WBC 9.6 4.0 - 10.5 K/uL   RBC 4.02 3.87 - 5.11 MIL/uL   Hemoglobin 11.6 (L) 12.0 - 15.0 g/dL   HCT 34.6 (L) 36.0 - 46.0 %   MCV 86.1 80.0 - 100.0 fL   MCH 28.9 26.0 - 34.0 pg   MCHC 33.5 30.0 - 36.0 g/dL   RDW 14.5 11.5 - 15.5 %   Platelets 277 150 - 400 K/uL   nRBC 0.0 0.0 - 0.2 %  hCG, quantitative, pregnancy     Status: Abnormal   Collection Time: 04/03/22  4:28 PM  Result Value Ref Range   hCG, Beta Chain, Quant, S 15,151 (H) <5 mIU/mL   US OB LESS THAN 14 WEEKS WITH OB TRANSVAGINAL  Result Date: 04/03/2022 CLINICAL DATA:  Heavy bleeding.  Pregnant patient. EXAM: OBSTETRIC <14 WK ULTRASOUND TECHNIQUE: Transabdominal ultrasound was performed for evaluation of the gestation as well as the maternal uterus and adnexal regions. COMPARISON:  April 01, 2022 Ob ultrasound FINDINGS: Intrauterine gestational sac: An oval fluid collection is identified in the lower uterine segment. The collection is flattened and is migrated from the fundus into the lower uterine segment in the interval. Yolk sac:  Not Visualized. Embryo:  Not Visualized. MSD:  11.2 mm   5 w   6 d Subchorionic hemorrhage:  None visualized. Maternal uterus/adnexae: Normal IMPRESSION: The gestational sac is migrated from the fundus into the lower uterine segment. Additionally, the sac is now flattened an abnormal in shape. There is no fetal pole or yolk sac identified. The findings are most consistent with a spontaneous abortion in progress. Recommend close follow-up. Electronically Signed   By: Dorise Bullion III M.D.   On: 04/03/2022 17:33    Meds ordered this encounter  Medications   HYDROcodone-acetaminophen (NORCO/VICODIN) 5-325 MG  per tablet 2 tablet   ibuprofen (ADVIL) 600 MG tablet    Sig: Take 1 tablet (600 mg total) by mouth every 6 (six) hours as needed for up to 30 doses.    Dispense:  30 tablet    Refill:  0    Order Specific Question:   Supervising Provider    Answer:   Emeterio Reeve  G [[id number]]   HYDROcodone-acetaminophen (NORCO/VICODIN) 5-325 MG tablet    Sig: Take 2 tablets by mouth every 4 (four) hours as needed for up to 3 days.    Dispense:  18 tablet    Refill:  0    Order Specific Question:   Supervising Provider    Answer:   Woodroe Mode [[id number]]   promethazine (PHENERGAN) 12.5 MG tablet    Sig: Take 1 tablet (12.5 mg total) by mouth every 6 (six) hours as needed for nausea or vomiting.    Dispense:  30 tablet    Refill:  0    Order Specific Question:   Supervising Provider    Answer:   Woodroe Mode [[id number]]   Assessment and Plan  --31 y.o. [phone] with miscarriage in progress --Hgb 11.6 --Rh POS --Through discussion of expectations for bleeding, indications for return to MAU --Discharge home in stable condition with strict return precautions  F/U: --High alert secure chat sent to [facility] to schedule miscarriage follow-up in one week  Darlina Rumpf, [place], MSN, CNM 04/04/2022, 11:23 AM

## 2022-04-03 NOTE — MAU Note (Signed)
Peggy Savage is a 31 y.o. at 77w1dhere in MAU reporting: bleeding got heavy last night, passing clots, soaking pads, when she stands up, pieces come out.   6 pads today. Cramping in lower abd.  Onset of complaint: last night Pain score: 7 Vitals:   04/03/22 1607  BP: 120/69  Pulse: 81  Resp: 18  Temp: 98.1 F (36.7 C)  SpO2: 99%      Lab orders placed from triage:  none

## 2022-04-15 ENCOUNTER — Ambulatory Visit (HOSPITAL_COMMUNITY): Payer: Medicaid Other

## 2022-04-19 ENCOUNTER — Inpatient Hospital Stay (HOSPITAL_COMMUNITY)
Admission: AD | Admit: 2022-04-19 | Discharge: 2022-04-19 | Disposition: A | Discharge status: Home / Self Care | Payer: Medicaid Other | Attending: Obstetrics and Gynecology | Admitting: Obstetrics and Gynecology

## 2022-04-19 ENCOUNTER — Encounter (HOSPITAL_COMMUNITY): Payer: Self-pay | Admitting: Obstetrics and Gynecology

## 2022-04-19 ENCOUNTER — Inpatient Hospital Stay (HOSPITAL_COMMUNITY): Payer: Medicaid Other

## 2022-04-19 DIAGNOSIS — N939 Abnormal uterine and vaginal bleeding, unspecified: Secondary | ICD-10-CM | POA: Diagnosis not present

## 2022-04-19 DIAGNOSIS — O26891 Other specified pregnancy related conditions, first trimester: Secondary | ICD-10-CM | POA: Diagnosis present

## 2022-04-19 DIAGNOSIS — D508 Other iron deficiency anemias: Secondary | ICD-10-CM

## 2022-04-19 DIAGNOSIS — O034 Incomplete spontaneous abortion without complication: Secondary | ICD-10-CM | POA: Diagnosis not present

## 2022-04-19 DIAGNOSIS — O039 Complete or unspecified spontaneous abortion without complication: Secondary | ICD-10-CM

## 2022-04-19 DIAGNOSIS — Z3A12 12 weeks gestation of pregnancy: Secondary | ICD-10-CM | POA: Diagnosis not present

## 2022-04-19 DIAGNOSIS — O99011 Anemia complicating pregnancy, first trimester: Secondary | ICD-10-CM | POA: Diagnosis not present

## 2022-04-19 LAB — CBC
HCT: 32.5 % — ABNORMAL LOW (ref 36.0–46.0)
Hemoglobin: 10.2 g/dL — ABNORMAL LOW (ref 12.0–15.0)
MCH: 27.8 pg (ref 26.0–34.0)
MCHC: 31.4 g/dL (ref 30.0–36.0)
MCV: 88.6 fL (ref 80.0–100.0)
Platelets: 353 10*3/uL (ref 150–400)
RBC: 3.67 MIL/uL — ABNORMAL LOW (ref 3.87–5.11)
RDW: 14.2 % (ref 11.5–15.5)
WBC: 8 10*3/uL (ref 4.0–10.5)
nRBC: 0 % (ref 0.0–0.2)

## 2022-04-19 LAB — HCG, QUANTITATIVE, PREGNANCY: hCG, Beta Chain, Quant, S: 1073 m[IU]/mL — ABNORMAL HIGH (ref <5)

## 2022-04-19 MED ORDER — FERROUS SULFATE 325 (65 FE) MG PO TABS: 325.0000 mg | ORAL_TABLET | Freq: Every day | ORAL | 0 refills | Status: DC

## 2022-04-19 MED ORDER — IBUPROFEN 800 MG PO TABS: 800.0000 mg | ORAL_TABLET | Freq: Every day | ORAL | Status: DC | PRN

## 2022-04-19 MED ORDER — MISOPROSTOL 200 MCG PO TABS: ORAL_TABLET | ORAL | 1 refills | Status: DC

## 2022-04-19 MED ORDER — ONDANSETRON 4 MG PO TBDP: 4.0000 mg | ORAL_TABLET | Freq: Four times a day (QID) | ORAL | 0 refills | Status: DC | PRN

## 2022-04-19 NOTE — MAU Note (Addendum)
.  Peggy Savage is a 32 y.o. at 30w3dhere in MAU reporting: had an SAB on dec 17, states she has been bleeding since and this morning she woke up and started passing large clots and then "bleeding like water". Also having chest pain she rates 6/10 with inspiration and lower abdominal pain she rates 6/10 "like contractions". Has soaked 6 maxi pads today.  Pain score: 6 Vitals:   04/19/22 1513  BP: 124/70  Pulse: 82  Resp: 14  Temp: 99 F (37.2 C)  SpO2: 99%

## 2022-04-19 NOTE — MAU Provider Note (Signed)
History     CSN: [id number]  Arrival date and time: 04/19/22 1449   Event Date/Time   First Provider Initiated Contact with Patient 04/19/22 1539      Chief Complaint  Patient presents with   Abdominal Pain   Vaginal Bleeding   Abdominal Pain Pertinent negatives include no diarrhea, dysuria, fever, nausea or vomiting.  Vaginal Bleeding Associated symptoms include abdominal pain. Pertinent negatives include no back pain, chills, diarrhea, dysuria, fever, flank pain, nausea, rash, sore throat or vomiting.     Patient is 32 y.o. [id number] 4w3dhere with complaints of vaginal bleeding. Was seen on 12/17 for SAB (10wk). UKoreashowed that gestational sac was in LUS and flattened but she had not passed the pregnancy.  [facility]. She reports constant daily clots in the morning with some spotting through the day but today she had onset of heavy vaginal bleeding that is copious and soaking pads. Has used 6 pads today. Reports cramping that is 6-7/10. Today she passed clots the size of the palm of her hand.   She reports dizziness with changes in position. SOB with climbing stairs now.   OB History     Gravida  4   Para  3   Term  3   Preterm  0   AB  0   Living  3      SAB  0   IAB  0   Ectopic  0   Multiple  0   Live Births  3           Past Medical History:  Diagnosis Date   Anemia    Vitamin D deficiency     Past Surgical History:  Procedure Laterality Date   NO PAST SURGERIES      Family History  Problem Relation Age of Onset   Diabetes Father    Hypertension Father    Diabetes Mother    Hypertension Mother    Heart attack Mother    Heart disease Mother     Social History   Tobacco Use   Smoking status: Never   Smokeless tobacco: Never  Vaping Use   Vaping Use: Never used  Substance Use Topics   Alcohol use: No   Drug use: No    Allergies: No Known Allergies  Medications Prior to Admission   Medication Sig Dispense Refill Last Dose   ibuprofen (ADVIL) 600 MG tablet Take 1 tablet (600 mg total) by mouth every 6 (six) hours as needed for up to 30 doses. 30 tablet 0    promethazine (PHENERGAN) 12.5 MG tablet Take 1 tablet (12.5 mg total) by mouth every 6 (six) hours as needed for nausea or vomiting. 30 tablet 0     Review of Systems  Constitutional:  Negative for chills and fever.  HENT:  Negative for congestion and sore throat.   Eyes:  Negative for pain and visual disturbance.  Respiratory:  Negative for cough, chest tightness and shortness of breath.   Cardiovascular:  Negative for chest pain.  Gastrointestinal:  Positive for abdominal pain. Negative for diarrhea, nausea and vomiting.  Endocrine: Negative for cold intolerance and heat intolerance.  Genitourinary:  Positive for vaginal bleeding. Negative for dysuria and flank pain.  Musculoskeletal:  Negative for back pain.  Skin:  Negative for rash.  Allergic/Immunologic: Negative for food allergies.  Neurological:  Negative for dizziness and light-headedness.  Psychiatric/Behavioral:  Negative for agitation.  Physical Exam   Blood pressure 124/70, pulse 82, temperature 99 F (37.2 C), temperature source Oral, resp. rate 14, weight 98.7 kg, last menstrual period 01/22/2022, SpO2 99 %, currently breastfeeding.  Physical Exam Vitals and nursing note reviewed. Exam conducted with a chaperone present.  Constitutional:      General: She is not in acute distress.    Appearance: She is well-developed.     Comments: Pale appearing.   HENT:     Head: Normocephalic and atraumatic.  Eyes:     General: No scleral icterus.    Conjunctiva/sclera: Conjunctivae normal.  Cardiovascular:     Rate and Rhythm: Normal rate.  Pulmonary:     Effort: Pulmonary effort is normal.  Chest:     Chest wall: No tenderness.  Abdominal:     Palpations: Abdomen is soft.     Tenderness: There is no abdominal tenderness. There is no guarding  or rebound.  Genitourinary:    Exam position: Lithotomy position.     Labia:        Right: No rash or tenderness.        Left: No rash or tenderness.      Vagina: Bleeding present.     Cervix: Dilated. Discharge and cervical bleeding present.     Uterus: Enlarged (8 wk).      Adnexa: Right adnexa normal and left adnexa normal.     Comments: There was heterogenous gray/tan matter at the cervical os consistent with likely partial passage of the POC. Used ringed forceps a grasp the products of conception and gently twist and remove.  Placed in specimen cup Musculoskeletal:        General: Normal range of motion.     Cervical back: Normal range of motion and neck supple.  Skin:    General: Skin is warm and dry.     Findings: No rash.  Neurological:     Mental Status: She is alert and oriented to person, place, and time.    MAU Course  Procedures- [place]  US OB LESS THAN 14 WEEKS WITH OB TRANSVAGINAL  Result Date: 04/19/2022 CLINICAL DATA:  Prior 04/03/2022 Ob ultrasound suggesting spontaneous abortion. Vaginal bleeding since this morning. Last menstrual period 01/22/2022, which would correspond to estimated gestational age of [redacted] weeks 3 days. EXAM: OBSTETRIC <14 WK [place] AND TRANSVAGINAL OB US DOPPLER ULTRASOUND OF OVARIES TECHNIQUE: Both transabdominal and transvaginal ultrasound examinations were performed for complete evaluation of the gestation as well as the maternal uterus, adnexal regions, and pelvic cul-de-sac. Transvaginal technique was performed to assess early pregnancy. Color and duplex Doppler ultrasound was utilized to evaluate blood flow to the ovaries. COMPARISON:  Early OB ultrasound 04/03/2022.  And 04/01/2022 FINDINGS: Intrauterine gestational sac: None Yolk sac:  Not Visualized. Embryo:  Not Visualized. Cardiac Activity: Not applicable Heart Rate: Not applicable Subchorionic hemorrhage:  None visualized. Maternal uterus/adnexae: Endometrial stripe measures up to approximately 19 mm,  again thickened and heterogeneous. Mild internal color flow vascularity. The maternal right ovary measures 3.7 x 2.0 x 2.7 cm and the maternal left ovary measures 2.9 x 2.5 x 2.2 cm. Color flow doppler evaluation of both ovaries demonstrates grossly normal vascularity. IMPRESSION: Previously the gestational sac had migrated inferiorly and was flattened and abnormal in shape suggesting a spontaneous abortion in progress. Now no gestational sac or intrauterine pregnancy is visualized. The endometrial stripe remains mildly thickened and heterogeneous with increased vascularity. Recommend continued clinical follow-up. Electronically Signed   By: Viann Fish.D.  On: 04/19/2022 16:58      MDM Moderate  Assessment and Plan   1. Vaginal bleeding - likely related to incomplete SAB, extracted tissue today in MAU. Bhcg had decreased froim 15K to 1K today. - Will provide cytotec Rx to help with bleeding and further contract the uterus.  - Zofran prn nausea - Needs follow up in 1 week at [facility], message sent high priority to the the clinic  2. Miscarriage- completed in the MAU today.   3. Anemia - Sent in rx for ferrous sulfate given 1 pt drop in HGB with symptoms on historyf - Repeat CBC recommended in 4-6 weeks     Caren Macadam 04/19/2022, 4:20 PM

## 2022-04-20 ENCOUNTER — Ambulatory Visit: Payer: Medicaid Other | Admitting: Obstetrics and Gynecology

## 2022-04-21 LAB — SURGICAL PATHOLOGY

## 2022-04-24 ENCOUNTER — Other Ambulatory Visit: Payer: Self-pay | Admitting: Family Medicine

## 2022-04-24 DIAGNOSIS — O039 Complete or unspecified spontaneous abortion without complication: Secondary | ICD-10-CM

## 2022-04-26 ENCOUNTER — Ambulatory Visit (INDEPENDENT_AMBULATORY_CARE_PROVIDER_SITE_OTHER): Payer: Medicaid Other | Admitting: Family Medicine

## 2022-04-26 ENCOUNTER — Ambulatory Visit: Payer: Medicaid Other | Admitting: Family Medicine

## 2022-04-26 VITALS — BP 113/68 | HR 66 | Ht 66.93 in | Wt 215.2 lb

## 2022-04-26 DIAGNOSIS — O039 Complete or unspecified spontaneous abortion without complication: Secondary | ICD-10-CM | POA: Diagnosis not present

## 2022-04-26 DIAGNOSIS — R5383 Other fatigue: Secondary | ICD-10-CM

## 2022-04-26 DIAGNOSIS — R1084 Generalized abdominal pain: Secondary | ICD-10-CM | POA: Diagnosis not present

## 2022-04-26 LAB — POCT URINALYSIS DIPSTICK
Bilirubin, UA: NEGATIVE
Glucose, UA: NEGATIVE
Ketones, UA: NEGATIVE
Leukocytes, UA: NEGATIVE
Nitrite, UA: NEGATIVE
Protein, UA: POSITIVE — AB
Spec Grav, UA: 1.015 (ref 1.010–1.025)
Urobilinogen, UA: 1 E.U./dL
pH, UA: 6 (ref 5.0–8.0)

## 2022-04-26 NOTE — Progress Notes (Signed)
Established Patient Office Visit  Subjective   Patient ID: Peggy Savage, female    DOB: 1990/08/06  Age: 32 y.o. MRN: [id number]  No chief complaint on file.   32 year old G4, P3 who had a spontaneous abortion at about [redacted] weeks gestation.  She comes in for follow-up today.  She is doing well overall.  Still has very intermittent abdominal cramping, has had vaginal bleeding but seems to be slowing down.  No obvious bleeding on her underwear or pad today, however still has some bleeding when she wipes.  She feels somewhat tired but not short of breath.  She had expressed mild burning with urination but no flank pain fever or chills.  She has had no abnormal vaginal discharge.    Review of Systems  Constitutional:  Negative for chills and fever.  Eyes:  Negative for blurred vision.  Cardiovascular:  Negative for leg swelling.  Gastrointestinal:  Negative for abdominal pain and vomiting.  Genitourinary:  Positive for dysuria. Negative for frequency and urgency.  Musculoskeletal:  Negative for myalgias.  Skin:  Negative for itching.  Neurological:  Negative for dizziness.    Objective:     BP 113/68   Pulse 66   Ht 5' 6.93" (1.7 m)   Wt 215 lb 3.2 oz (97.6 kg)   LMP 01/22/2022   BMI 33.78 kg/m    Physical Exam Constitutional:      General: She is not in acute distress.    Appearance: Normal appearance. She is not toxic-appearing.  HENT:     Head: Normocephalic and atraumatic.     Nose: Nose normal.     Mouth/Throat:     Mouth: Mucous membranes are moist.  Eyes:     Extraocular Movements: Extraocular movements intact.     Conjunctiva/sclera: Conjunctivae normal.  Cardiovascular:     Rate and Rhythm: Normal rate.     Pulses: Normal pulses.  Pulmonary:     Effort: Pulmonary effort is normal. No respiratory distress.  Abdominal:     Palpations: Abdomen is soft.     Tenderness: There is no abdominal tenderness. There is no right CVA tenderness, left CVA tenderness  or guarding.  Skin:    General: Skin is warm and dry.     Capillary Refill: Capillary refill takes less than 2 seconds.  Neurological:     General: No focal deficit present.     Mental Status: She is alert.  Psychiatric:        Mood and Affect: Mood normal.        Behavior: Behavior normal.     Results for orders placed or performed in visit on 04/26/22  POCT Urinalysis Dipstick  Result Value Ref Range   Color, UA yellow    Clarity, UA dark    Glucose, UA Negative Negative   Bilirubin, UA negative    Ketones, UA negative    Spec Grav, UA 1.015 1.010 - 1.025   Blood, UA large    pH, UA 6.0 5.0 - 8.0   Protein, UA Positive (A) Negative   Urobilinogen, UA 1.0 0.2 or 1.0 E.U./dL   Nitrite, UA negative    Leukocytes, UA Negative Negative   Appearance     Odor      Results for orders placed or performed in visit on 04/26/22 (from the past 504 hour(s))  POCT Urinalysis Dipstick   Collection Time: 04/26/22 10:46 AM  Result Value Ref Range   Color, UA yellow    Clarity, UA dark  Glucose, UA Negative Negative   Bilirubin, UA negative    Ketones, UA negative    Spec Grav, UA 1.015 1.010 - 1.025   Blood, UA large    pH, UA 6.0 5.0 - 8.0   Protein, UA Positive (A) Negative   Urobilinogen, UA 1.0 0.2 or 1.0 E.U./dL   Nitrite, UA negative    Leukocytes, UA Negative Negative   Appearance     Odor    Results for orders placed or performed during the hospital encounter of 04/19/22 (from the past 504 hour(s))  hCG, quantitative, pregnancy   Collection Time: 04/19/22  3:47 PM  Result Value Ref Range   hCG, Beta Chain, Quant, S 1,073 (H) <5 mIU/mL  CBC   Collection Time: 04/19/22  3:47 PM  Result Value Ref Range   WBC 8.0 4.0 - 10.5 K/uL   RBC 3.67 (L) 3.87 - 5.11 MIL/uL   Hemoglobin 10.2 (L) 12.0 - 15.0 g/dL   HCT 32.5 (L) 36.0 - 46.0 %   MCV 88.6 80.0 - 100.0 fL   MCH 27.8 26.0 - 34.0 pg   MCHC 31.4 30.0 - 36.0 g/dL   RDW 14.2 11.5 - 15.5 %   Platelets 353 150 - 400  K/uL   nRBC 0.0 0.0 - 0.2 %  Surgical pathology   Collection Time: 04/19/22  4:10 PM  Result Value Ref Range   SURGICAL PATHOLOGY      SURGICAL PATHOLOGY CASE: [id number] PATIENT: Zeniah Koors Surgical Pathology Report     Clinical History: patient with SAB on 12/17 without passage of gestational sac, presenting with cramping and bleeding, parital passage of gestational sac on exam  (cm)    FINAL MICROSCOPIC DIAGNOSIS:  A. PRODUCTS OF CONCEPTION: - Products of conception (chorionic villi)       GROSS DESCRIPTION:  The specimen is received fresh and consists of a 6.7 x 3.6 x 2.3 cm aggregate of red-brown soft tissue and clotted blood.  Villous tissue is not grossly identified.  Representative sections are submitted in 5 cassettes.  Craig Staggers 04/20/2022)    Final Diagnosis performed by Jaquita Folds, MD.   Electronically signed 04/21/2022 Technical and / or Professional components performed at [facility], [place] [address], [place], [place] [address].  Immunohistochemistry Technical component (if applicable) was performed at [facility]. [address], [place], [place], [place] [address].   IMMUNOHISTOCHEMISTRY DISCLAIMER (if applicable): Some of these immunohistochemical stains may have been developed and the performance characteristics determine by [facility]. Some may not have been cleared or approved by the U.S. Food and Drug Administration. The FDA has determined that such clearance or approval is not necessary. This test is used for clinical purposes. It should not be regarded as investigational or for research. This laboratory is certified under the [place] (CLIA-88) as qualified to perform high complexity clinical laboratory testing.  The controls stained appropriately.      Assessment & Plan:   Problem List Items Addressed This Visit   None Visit  Diagnoses     Spontaneous abortion in first trimester    -  Primary   Relevant Orders   CBC   Beta hCG quant (ref lab)   Other fatigue       Relevant Orders   CBC   Generalized abdominal pain       Relevant Orders   POCT Urinalysis Dipstick (Completed)      She is doing well overall.  Spontaneous abortion was completed at her last visit in MAU about a week ago.  I discussed with her the anticipation for some vaginal bleeding and cramping as the uterus contracts back to original size. -Point-of-care UA done today, shows no concerns for UTI. -Ordered CBC to assess for anemia. -Ordered beta-hCG to follow-up on previous check and make sure that this has dropped appropriately. -I do not think an ultrasound is needed at this time.   Liliane Channel MD MPH OB Fellow, [place] [facility] [place] 04/26/2022

## 2022-04-26 NOTE — Progress Notes (Signed)
SAB f/u. Pt reports bleeding yesterday. Bleeding has slowed down. No bleeding today. Pt reports lower abdominal pain and pain on the right side. Pt c/o fatigue and shortness of breath.

## 2022-04-27 ENCOUNTER — Telehealth: Payer: Self-pay

## 2022-04-27 LAB — BETA HCG QUANT (REF LAB): hCG Quant: 15 m[IU]/mL

## 2022-04-27 LAB — CBC
Hematocrit: 31.1 % — ABNORMAL LOW (ref 34.0–46.6)
Hemoglobin: 10.3 g/dL — ABNORMAL LOW (ref 11.1–15.9)
MCH: 28.7 pg (ref 26.6–33.0)
MCHC: 33.1 g/dL (ref 31.5–35.7)
MCV: 87 fL (ref 79–97)
Platelets: 343 10*3/uL (ref 150–450)
RBC: 3.59 x10E6/uL — ABNORMAL LOW (ref 3.77–5.28)
RDW: 13.5 % (ref 11.7–15.4)
WBC: 6.9 10*3/uL (ref 3.4–10.8)

## 2022-04-27 NOTE — Telephone Encounter (Signed)
Returned call, pt stated that she is still experiencing flank pain that she believes is due to a uti. Advised of urinalysis results. Pt states that she would like to come back in for urine culture, notified scheduler.

## 2022-04-28 ENCOUNTER — Ambulatory Visit (INDEPENDENT_AMBULATORY_CARE_PROVIDER_SITE_OTHER): Payer: Medicaid Other

## 2022-04-28 VITALS — BP 112/76 | HR 73 | Ht 66.0 in | Wt 213.1 lb

## 2022-04-28 DIAGNOSIS — R300 Dysuria: Secondary | ICD-10-CM

## 2022-04-28 LAB — POCT URINALYSIS DIPSTICK
Bilirubin, UA: NEGATIVE
Blood, UA: POSITIVE
Glucose, UA: NEGATIVE
Ketones, UA: NEGATIVE
Leukocytes, UA: NEGATIVE
Nitrite, UA: NEGATIVE
Protein, UA: NEGATIVE
Spec Grav, UA: <=1.005 — AB (ref 1.010–1.025)
Urobilinogen, UA: 0.2 E.U./dL
pH, UA: 6 (ref 5.0–8.0)

## 2022-04-28 NOTE — Progress Notes (Addendum)
SUBJECTIVE: Peggy Savage is a 32 y.o. female who complains of urinary urgency and dysuria x 9 days, without fever, chills, or abnormal vaginal discharge. Pt reports having right flank pain that radiates into her back. She states she has been having these symptoms since she was started on ferrous sulfate. Pt requesting blood work to check kidney function.  OBJECTIVE: Appears well, in no apparent distress.  Vital signs are normal. Urine dipstick shows positive for RBC's.    ASSESSMENT: Dysuria, lower back pain.  PLAN: Treatment per orders.  Call or return to clinic prn if these symptoms worsen or fail to improve as anticipated. BMP collected. Urine culture sent.

## 2022-04-29 LAB — BASIC METABOLIC PANEL
BUN/Creatinine Ratio: 16 (ref 9–23)
BUN: 9 mg/dL (ref 6–20)
CO2: 23 mmol/L (ref 20–29)
Calcium: 9.4 mg/dL (ref 8.7–10.2)
Chloride: 103 mmol/L (ref 96–106)
Creatinine, Ser: 0.57 mg/dL (ref 0.57–1.00)
Glucose: 87 mg/dL (ref 70–99)
Potassium: 4 mmol/L (ref 3.5–5.2)
Sodium: 138 mmol/L (ref 134–144)
eGFR: 123 mL/min/{1.73_m2} (ref >59)

## 2022-04-30 LAB — URINE CULTURE

## 2022-05-05 ENCOUNTER — Encounter: Payer: Self-pay | Admitting: Obstetrics and Gynecology

## 2022-05-17 ENCOUNTER — Other Ambulatory Visit: Payer: Self-pay | Admitting: Family Medicine

## 2022-05-18 ENCOUNTER — Ambulatory Visit (INDEPENDENT_AMBULATORY_CARE_PROVIDER_SITE_OTHER): Payer: Medicaid Other | Admitting: Family Medicine

## 2022-05-18 ENCOUNTER — Encounter: Payer: Self-pay | Admitting: Family Medicine

## 2022-05-18 VITALS — BP 114/78 | HR 68 | Wt 212.2 lb

## 2022-05-18 DIAGNOSIS — O039 Complete or unspecified spontaneous abortion without complication: Secondary | ICD-10-CM

## 2022-05-18 DIAGNOSIS — Z3A12 12 weeks gestation of pregnancy: Secondary | ICD-10-CM

## 2022-05-18 MED ORDER — PREPLUS 27-1 MG PO TABS: 1.0000 | ORAL_TABLET | Freq: Every day | ORAL | 13 refills | Status: DC

## 2022-05-18 NOTE — Progress Notes (Signed)
Was seen in MAU on 1/2 for SAB. States that she bled for appx 2 weeks after. No bleeding after 2 weeks. Had intercourse yesterday and then started with heavy bleeding yesterday and today. States she took a 10 day iron supplement and now is experiencing pain on both sides in hip area.  Would like to continue to try for another pregnancy so declines contraception at this time.  No other issues or concerns.  Altha Harm, CMA

## 2022-05-18 NOTE — Progress Notes (Unsigned)
   MISCARRIAGE/DC follow up  Subjective:   Peggy Savage is a 32 y.o. [phone] female here for f/u from SAB with removal of POCs in MAU on 1/2. She reports bleeding for 1 week and then no bleeding for 2 weeks and then reports onset of heavy bleeding yesterday and today.   HCG 15,151--> 1073-->15  Denies continued bleeding/ cramping. Reports emotionally OK. Does not desire contraception.   The following portions of the patient's history were reviewed and updated as appropriate: allergies, current medications, past family history, past medical history, past social history, past surgical history and problem list.  Review of Systems Pertinent items are noted in HPI.   Objective:  BP 114/78   Pulse 68   Wt 212 lb 3.2 oz (96.3 kg)   LMP 01/22/2022   Breastfeeding Unknown   BMI 34.25 kg/m  Gen: well appearing, NAD HEENT: no scleral icterus CV: RR Lung: Normal WOB Ext: warm well perfused  Lab Results  Component Value Date   ABORH A POS 04/03/2022     Assessment and Plan:  1. Miscarriage within last 12 months - Missed AB or incomplete AB? No -- passage in MAU with confirmed surgical pathology and HCG declining - reviewed preconception health  - reviewed contraception and basic fertility. Patient desires pregnancy. Does not plan to use contraception - provided resources and support regarding pregnancy loss.  - Repeat BHCG today  Current bleeding maybe her menses restarting.   Please refer to After Visit Summary for other counseling recommendations.   Return in about 3 months (around 08/16/2022) for anemia/pregnancy follow up with [facility] if possible.

## 2022-05-19 LAB — BETA HCG QUANT (REF LAB): hCG Quant: <1 m[IU]/mL

## 2022-08-06 ENCOUNTER — Emergency Department (HOSPITAL_BASED_OUTPATIENT_CLINIC_OR_DEPARTMENT_OTHER)
Admission: EM | Admit: 2022-08-06 | Discharge: 2022-08-06 | Discharge status: Against Medical Advice | Payer: Medicaid Other | Source: Home / Self Care

## 2022-09-05 ENCOUNTER — Other Ambulatory Visit (HOSPITAL_COMMUNITY)
Admission: RE | Admit: 2022-09-05 | Discharge: 2022-09-05 | Disposition: A | Discharge status: Home / Self Care | Payer: Medicaid Other | Source: Ambulatory Visit | Attending: Obstetrics and Gynecology | Admitting: Obstetrics and Gynecology

## 2022-09-05 ENCOUNTER — Encounter: Payer: Self-pay | Admitting: Obstetrics and Gynecology

## 2022-09-05 ENCOUNTER — Ambulatory Visit (INDEPENDENT_AMBULATORY_CARE_PROVIDER_SITE_OTHER): Payer: Medicaid Other | Admitting: Obstetrics and Gynecology

## 2022-09-05 VITALS — BP 117/71 | HR 77 | Ht 66.0 in | Wt 207.0 lb

## 2022-09-05 DIAGNOSIS — N939 Abnormal uterine and vaginal bleeding, unspecified: Secondary | ICD-10-CM

## 2022-09-05 DIAGNOSIS — Z01419 Encounter for gynecological examination (general) (routine) without abnormal findings: Secondary | ICD-10-CM | POA: Diagnosis not present

## 2022-09-05 DIAGNOSIS — Z113 Encounter for screening for infections with a predominantly sexual mode of transmission: Secondary | ICD-10-CM

## 2022-09-05 DIAGNOSIS — Z1339 Encounter for screening examination for other mental health and behavioral disorders: Secondary | ICD-10-CM | POA: Diagnosis not present

## 2022-09-05 LAB — CYTOLOGY - PAP

## 2022-09-05 MED ORDER — TRANEXAMIC ACID 650 MG PO TABS: 1300.0000 mg | ORAL_TABLET | Freq: Three times a day (TID) | ORAL | 11 refills | Status: DC

## 2022-09-05 NOTE — Progress Notes (Signed)
Pt presents for annual reports R breast after cycles ends since Dec.  She stopped breastfeeding Dec 2023. Mammogram completed 08/01/22 Normal pap smear 08/24/2020

## 2022-09-05 NOTE — Progress Notes (Signed)
ANNUAL EXAM Patient name: Peggy Savage MRN [id number]  Date of birth: 1990-10-19 Chief Complaint:   Gynecologic Exam  History of Present Illness:   Peggy Savage is a 32 y.o. [phone] with Patient's last menstrual period was 08/08/2022. being seen today for a routine annual exam.   Current complaints:  Reports heavy periods x 5-6 months. Cycles regular, last 7 days. Has heavy bleeding with clementine sized clots for first 4 days. Changes pad every 2 hours. Has to limit daily activities because she is worried about messes related to her bleeding.    Upstream - 09/05/22 1514       Pregnancy Intention Screening   Does the patient want to become pregnant in the next year? No    Does the patient's partner want to become pregnant in the next year? No    Would the patient like to discuss contraceptive options today? No      Contraception Wrap Up   Current Method No Contraceptive Precautions    End Method No Contraception Precautions    Contraception Counseling Provided No    How was the end contraceptive method provided? N/A            The pregnancy intention screening data noted above was reviewed. Potential methods of contraception were discussed. The patient elected to proceed with No Contraception Precautions.   Last pap 08/07/20. Results were: NILM w/ HRHPV not done. H/O abnormal pap: no Last mammogram: n/a Family h/o breast cancer: no Last colonoscopy: n/a. Family h/o colorectal cancer: no     09/05/2022    3:13 PM 04/01/2022    8:35 AM  Depression screen PHQ 2/9  Decreased Interest 0 0  Down, Depressed, Hopeless 0 0  PHQ - 2 Score 0 0  Altered sleeping 0 0  Tired, decreased energy 0 0  Change in appetite 0 0  Feeling bad or failure about yourself  0 0  Trouble concentrating 0 0  Moving slowly or fidgety/restless 0 0  Suicidal thoughts 0 0  PHQ-9 Score 0 0        09/05/2022    3:13 PM 04/01/2022    8:36 AM  GAD 7 : Generalized Anxiety Score   Nervous, Anxious, on Edge 0 0  Control/stop worrying 0 0  Worry too much - different things 0 0  Trouble relaxing 0 0  Restless 0 0  Easily annoyed or irritable 0 0  Afraid - awful might happen 0 0  Total GAD 7 Score 0 0  Anxiety Difficulty Not difficult at all      Review of Systems:   Pertinent items are noted in HPI Denies any headaches, blurred vision, fatigue, shortness of breath, chest pain, abdominal pain, abnormal vaginal discharge/itching/odor/irritation, bowel movements, urination, or intercourse unless otherwise stated above. Pertinent History Reviewed:  Reviewed past medical,surgical, social and family history.  Reviewed problem list, medications and allergies. Physical Assessment:   Vitals:   09/05/22 1511  BP: 117/71  Pulse: 77  Weight: 207 lb (93.9 kg)  Body mass index is 33.41 kg/m.        Physical Examination:   General appearance - well appearing, and in no distress  Mental status - alert, oriented to person, place, and time  Chest - respiratory effort normal  Heart - normal peripheral perfusion  Breasts - breasts appear normal, no suspicious masses, no skin or nipple changes or axillary nodes  Abdomen - soft, nontender, nondistended, no masses or organomegaly  Pelvic - VULVA: normal appearing  vulva with no masses, tenderness or lesions  VAGINA: normal appearing vagina with normal color and discharge, no lesions  CERVIX: normal appearing cervix without discharge or lesions, no CMT  UTERUS: uterus is felt to be normal size, shape, consistency and nontender   ADNEXA: No adnexal masses or tenderness noted.  Chaperone present for exam  No results found for this or any previous visit (from the past 24 hour(s)).  Assessment & Plan:  1) Well-Woman Exam Mammogram: @ 34yo, or sooner if problems Colonoscopy: @ 34yo, or sooner if problems Pap: Due 09/2023 GC/CT: Collected HIV/HCV: Collected  2) Abnormal uterine bleeding GC/CT/trich  collected CBC TSH Pelvic ultrasound We reviewed management options including COCPs (continuous & cyclic), patch, ring, progestins (continuous & cyclic), DMPA (Depo Provera), LNG-IUD (Mirena), Lysteda (TXA), and scheduled NSAIDs -     tranexamic acid (LYSTEDA) 650 MG TABS tablet; Take 2 tablets (1,300 mg total) by mouth 3 (three) times daily. Take during menses for a maximum of five days  Labs/procedures today:   Orders Placed This Encounter  Procedures   US PELVIC COMPLETE WITH TRANSVAGINAL   CBC   TSH   HIV antibody (with reflex)   Hepatitis C Antibody   RPR   Hepatitis B Surface AntiGEN   Meds:  Meds ordered this encounter  Medications   tranexamic acid (LYSTEDA) 650 MG TABS tablet    Sig: Take 2 tablets (1,300 mg total) by mouth 3 (three) times daily. Take during menses for a maximum of five days    Dispense:  30 tablet    Refill:  11    Follow-up: Return in about 1 year (around 09/05/2023), or if symptoms worsen or fail to improve, for annual exam.  Lennart Pall, MD 09/05/2022 3:53 PM

## 2022-09-06 LAB — CBC
Hematocrit: 34.1 % (ref 34.0–46.6)
Hemoglobin: 10.8 g/dL — ABNORMAL LOW (ref 11.1–15.9)
MCH: 26.3 pg — ABNORMAL LOW (ref 26.6–33.0)
MCHC: 31.7 g/dL (ref 31.5–35.7)
MCV: 83 fL (ref 79–97)
Platelets: 343 10*3/uL (ref 150–450)
RBC: 4.11 x10E6/uL (ref 3.77–5.28)
RDW: 14.6 % (ref 11.7–15.4)
WBC: 7.7 10*3/uL (ref 3.4–10.8)

## 2022-09-06 LAB — CERVICOVAGINAL ANCILLARY ONLY
Chlamydia: NEGATIVE
Comment: NEGATIVE
Comment: NEGATIVE
Comment: NORMAL
Neisseria Gonorrhea: NEGATIVE
Trichomonas: NEGATIVE

## 2022-09-06 LAB — HEPATITIS B SURFACE ANTIGEN: Hepatitis B Surface Ag: NEGATIVE

## 2022-09-06 LAB — HEPATITIS C ANTIBODY: Hep C Virus Ab: NONREACTIVE

## 2022-09-06 LAB — RPR: RPR Ser Ql: NONREACTIVE

## 2022-09-06 LAB — HIV ANTIBODY (ROUTINE TESTING W REFLEX): HIV Screen 4th Generation wRfx: NONREACTIVE

## 2022-09-06 LAB — TSH: TSH: 3.07 u[IU]/mL (ref 0.450–4.500)

## 2022-09-14 ENCOUNTER — Ambulatory Visit (HOSPITAL_BASED_OUTPATIENT_CLINIC_OR_DEPARTMENT_OTHER)
Admission: RE | Admit: 2022-09-14 | Discharge: 2022-09-14 | Disposition: A | Discharge status: Home / Self Care | Payer: Medicaid Other | Source: Ambulatory Visit | Attending: Obstetrics and Gynecology | Admitting: Obstetrics and Gynecology

## 2022-09-14 DIAGNOSIS — N939 Abnormal uterine and vaginal bleeding, unspecified: Secondary | ICD-10-CM | POA: Insufficient documentation

## 2022-09-21 ENCOUNTER — Telehealth (INDEPENDENT_AMBULATORY_CARE_PROVIDER_SITE_OTHER): Payer: Medicaid Other | Admitting: Obstetrics and Gynecology

## 2022-09-21 DIAGNOSIS — N939 Abnormal uterine and vaginal bleeding, unspecified: Secondary | ICD-10-CM

## 2022-09-21 NOTE — Progress Notes (Signed)
TELEHEALTH GYNECOLOGY VISIT ENCOUNTER NOTE  Provider location: [facility]   Patient location: Home  I connected with Peggy Savage on 09/21/22 at 9:26am by MyChart audiovisual encounter.   History:  Peggy Savage is a 32 y.o. [id number] with LMP 09/09/22 presenting for follow up of AUB/menorrhagia  Seen by myself on 5/20 for annual exam and reported heavy periods x 6 months. Regular cycle, last 7 days but passes clemetine sized clots for the firt 4 days. We obtained the following at that appointment & I have personally reviewed the following results: - GC/CT/trichomonas negative 09/05/22 - CBC w/ Hgb 10.8 09/05/22 - TSH 3.070 09/05/22 - Pelvic US 09/14/22: 13.6 x 5.8 x 8.2cm uterus w/ EL 11.30mm and 1.7 x 0.4 x 1.9cm nonspecific echogenic region within the mid aspect of the endometrium representing endometrial proteinaceous material vs endometrial mass  Today, pt reports LMP 5/24. Heavy & lasted 7 days. She did not try the lysteda because she wanted to review her results first.    Past Medical History:  Diagnosis Date   Anemia    Vitamin D deficiency    Past Surgical History:  Procedure Laterality Date   NO PAST SURGERIES     The following portions of the patient's history were reviewed and updated as appropriate: allergies, current medications, past family history, past medical history, past social history, past surgical history and problem list.   Health Maintenance:  Normal pap and negative HRHPV on 09/09/19.   Review of Systems:  Pertinent items noted in HPI and remainder of comprehensive ROS otherwise negative.  Physical Exam:   General:  Alert, oriented and cooperative.   Mental Status: Normal mood and affect perceived. Normal judgment and thought content.  Physical exam deferred due to nature of the encounter  Labs and Imaging No results found for this or any previous visit (from the past 336 hour(s)). US PELVIC COMPLETE WITH  TRANSVAGINAL  Result Date: 09/16/2022 CLINICAL DATA:  Abnormal uterine bleeding. EXAM: TRANSABDOMINAL AND TRANSVAGINAL ULTRASOUND OF PELVIS TECHNIQUE: Both transabdominal and transvaginal ultrasound examinations of the pelvis were performed. Transabdominal technique was performed for global imaging of the pelvis including uterus, ovaries, adnexal regions, and pelvic cul-de-sac. It was necessary to proceed with endovaginal exam following the transabdominal exam to visualize the endometrium. COMPARISON:  Pelvic ultrasound 04/19/2022 FINDINGS: Uterus Measurements: 13.6 x 5.8 x 8.2 cm = volume: 333.2 mL. No fibroids or other mass visualized. Endometrium Thickness: 11.6 mm. There is fluid within the endometrial cavity. Additionally there is a 1.7 x 0.4 x 1.9 cm echogenic region within the mid aspect of the endometrium, nonspecific. This may represent endometrial proteinaceous material or potentially an endometrial mass. Right ovary Measurements: 3.7 x 2.1 x 1.9 cm = volume: 7.6 mL. Normal appearance/no adnexal mass. Left ovary Measurements: 3.1 x 2.5 x 2.4 cm = volume: 9.5 mL. Normal appearance/no adnexal mass. Other findings No abnormal free fluid. IMPRESSION: There is fluid within the endometrial cavity. Additionally there is a 1.7 x 0.4 x 1.9 cm echogenic region within the mid aspect of the endometrium, nonspecific. This may represent endometrial proteinaceous material or potentially an endometrial mass. Consider further evaluation with sonohysterogram for confirmation prior to hysteroscopy. Endometrial sampling should also be considered if patient is at high risk for endometrial carcinoma. Electronically Signed   By: Annia Belt M.D.   On: 09/16/2022 08:20      Assessment and Plan:  31yo with abnormal uterine bleeding and abnormal pelvic ultrasound findings  AUB/abnormal  ultrasound - Reviewed labs & US findings with patient. Discussed that her US findings may be due to the fact she was menstruating at the time  of her [place] - Recommended sonohysterogram on D8-10 of her cycle to better evaluate if this is debris related to menstruation, polyp or endometrial mass - Pt is OK to start lysteda with next cycle   I discussed the assessment and treatment plan with the patient. The patient was provided an opportunity to ask questions and all were answered. The patient agreed with the plan and demonstrated an understanding of the instructions.   The patient was advised to call back or seek an in-person evaluation/go to the ED if the symptoms worsen or if the condition fails to improve as anticipated.  I provided 23 minutes of non-face-to-face time during this encounter.  Lennart Pall, MD [facility] [employer]

## 2022-09-22 ENCOUNTER — Ambulatory Visit
Admission: EM | Admit: 2022-09-22 | Discharge: 2022-09-22 | Disposition: A | Discharge status: Home / Self Care | Payer: Medicaid Other | Attending: Nurse Practitioner | Admitting: Nurse Practitioner

## 2022-09-22 DIAGNOSIS — K047 Periapical abscess without sinus: Secondary | ICD-10-CM

## 2022-09-22 MED ORDER — LIDOCAINE VISCOUS HCL 2 % MT SOLN: 15.0000 mL | Freq: Four times a day (QID) | OROMUCOSAL | 0 refills | Status: DC | PRN

## 2022-09-22 MED ORDER — AMOXICILLIN-POT CLAVULANATE 875-125 MG PO TABS: 1.0000 | ORAL_TABLET | Freq: Two times a day (BID) | ORAL | 0 refills | Status: DC

## 2022-09-22 NOTE — ED Provider Notes (Signed)
[facility]    CSN: [id number] Arrival date & time: 09/22/22  1130      History   Chief Complaint Chief Complaint  Patient presents with   Dental Pain    HPI Peggy Savage is a 32 y.o. female presents for evaluation of dental pain.  Patient reports 4 days of the right upper molar pain that was initially only with cold or hot liquids but is now constant. Denies injury to the tooth. No fevers, facial swelling, gum swelling. No hx of tooth abscess in the past. She has been taking ibuprofen for sx. She has an appt with her dentist next week.    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 Authorizing Provider  amoxicillin-clavulanate (AUGMENTIN) 875-125 MG tablet Take 1 tablet by mouth every 12 (twelve) hours. 09/22/22  Yes Radford Pax, NP  lidocaine (XYLOCAINE) 2 % solution Use as directed 15 mLs in the mouth or throat every 6 (six) hours as needed (tooth pain). Apply to gum line of affected tooth with q-tip or may gargle and spit, do not swallow the medication 09/22/22  Yes Radford Pax, NP  ferrous sulfate 325 (65 FE) MG tablet Take 1 tablet (325 mg  total) by mouth daily. Patient not taking: Reported on 09/05/2022 04/19/22   Federico Flake, MD  omega-3 acid ethyl esters (LOVAZA) 1 g capsule Take by mouth 2 (two) times daily.    [provider]  Prenatal Vit-Fe Fumarate-FA (PREPLUS) 27-1 MG TABS Take 1 tablet by mouth daily. Patient not taking: Reported on 09/05/2022 05/18/22   Federico Flake, MD  tranexamic acid (LYSTEDA) 650 MG TABS tablet Take 2 tablets (1,300 mg total) by mouth 3 (three) times daily. Take during menses for a maximum of five days 09/05/22   Lennart Pall, MD  VITAMIN D PO Take by mouth.    [provider]    Family History Family History  Problem Relation Age of Onset   Diabetes Father    Hypertension Father    Diabetes Mother    Hypertension Mother    Heart attack Mother    Heart disease Mother     Social History Social History   Tobacco Use   Smoking status: Never    Passive exposure: Never   Smokeless tobacco: Never  Vaping Use   Vaping Use: Never used  Substance Use  Topics   Alcohol use: No   Drug use: No     Allergies   Patient has no known allergies.   Review of Systems Review of Systems  HENT:  Positive for dental problem.      Physical Exam Triage Vital Signs ED Triage Vitals  Enc Vitals Group     BP 09/22/22 1145 125/87     Pulse Rate 09/22/22 1145 63     Resp 09/22/22 1145 16     Temp 09/22/22 1145 98.9 F (37.2 C)     Temp Source 09/22/22 1145 Oral     SpO2 09/22/22 1145 98 %     Weight --      Height --      Head Circumference --      Peak Flow --      Pain Score 09/22/22 1147 9     Pain Loc --      Pain Edu? --      Excl. in GC? --    No data found.  Updated Vital Signs BP 125/87 (BP Location: Right Arm)   Pulse 63   Temp 98.9 F (37.2 C) (Oral)   Resp 16   LMP 09/09/2022 (Approximate)   SpO2 98%   Visual Acuity Right Eye Distance:   Left Eye Distance:   Bilateral Distance:    Right Eye Near:   Left Eye Near:     Bilateral Near:     Physical Exam Vitals and nursing note reviewed.  Constitutional:      General: She is not in acute distress.    Appearance: Normal appearance. She is not ill-appearing.  HENT:     Head: Normocephalic and atraumatic.     Mouth/Throat:      Comments: Tooth #1, mild gum line swelling without abscess. Dental caries noted Eyes:     Pupils: Pupils are equal, round, and reactive to light.  Cardiovascular:     Rate and Rhythm: Normal rate.  Pulmonary:     Effort: Pulmonary effort is normal.  Skin:    General: Skin is warm and dry.  Neurological:     General: No focal deficit present.     Mental Status: She is alert and oriented to person, place, and time.  Psychiatric:        Mood and Affect: Mood normal.        Behavior: Behavior normal.      UC Treatments / Results  Labs (all labs ordered are listed, but only abnormal results are displayed) Labs Reviewed - No data to display  EKG   Radiology No results found.  Procedures Procedures (including critical care time)  Medications Ordered in UC Medications - No data to display  Initial Impression / Assessment and Plan / UC Course  I have reviewed the triage vital signs and the nursing notes.  Pertinent labs & imaging results that were available during my care of the patient were reviewed by me and considered in my medical decision making (see chart for details).     Start Augmentin  Lidocaine as needed Continue OTC analgesics as needed Follow up with dentist at scheduled appt next week ER for any worsening sx prior to seeing dentist, red flags reviewed and pt verbalized understanding  Final Clinical Impressions(s) / UC Diagnoses   Final diagnoses:  Dental infection     Discharge Instructions      Augmentin twice daily for 7 days Lidocaine as needed. Apply with qtip to gum line or gargle.  DO not swallow the medication  Continue ibuprofen or tylenol as needed Follow up with your dentist  at your scheduled appointment next week Please go to the ER for any worsening symptoms prior to seeing your dentist    ED Prescriptions     Medication Sig Dispense Auth. Provider   amoxicillin-clavulanate (AUGMENTIN) 875-125 MG tablet Take 1 tablet by mouth every 12 (twelve) hours. 14 tablet Radford Pax, NP   lidocaine (XYLOCAINE) 2 % solution Use as directed 15 mLs in the mouth or throat every 6 (six) hours as needed (tooth pain). Apply to gum line of affected tooth with q-tip or may gargle and spit, do not swallow the medication 100 mL Radford Pax, NP      PDMP not reviewed this encounter.   Radford Pax, NP 09/22/22 1215

## 2022-09-22 NOTE — Discharge Instructions (Addendum)
Augmentin twice daily for 7 days Lidocaine as needed. Apply with qtip to gum line or gargle. DO not swallow the medication  Continue ibuprofen or tylenol as needed Follow up with your dentist at your scheduled appointment next week Please go to the ER for any worsening symptoms prior to seeing your dentist

## 2022-09-22 NOTE — ED Triage Notes (Signed)
Pt presents to UC w/ c/o upper right dental pain x4 days. Dentist appt next week. Ibuprofen and tylenol do not help anymore.

## 2022-10-09 ENCOUNTER — Encounter (HOSPITAL_BASED_OUTPATIENT_CLINIC_OR_DEPARTMENT_OTHER): Payer: Self-pay

## 2022-10-09 ENCOUNTER — Other Ambulatory Visit: Payer: Self-pay

## 2022-10-09 ENCOUNTER — Emergency Department (HOSPITAL_BASED_OUTPATIENT_CLINIC_OR_DEPARTMENT_OTHER)
Admission: EM | Admit: 2022-10-09 | Discharge: 2022-10-09 | Discharge status: Against Medical Advice | Payer: Medicaid Other | Attending: Emergency Medicine | Admitting: Emergency Medicine

## 2022-10-09 DIAGNOSIS — R112 Nausea with vomiting, unspecified: Secondary | ICD-10-CM | POA: Insufficient documentation

## 2022-10-09 DIAGNOSIS — J020 Streptococcal pharyngitis: Secondary | ICD-10-CM | POA: Insufficient documentation

## 2022-10-09 DIAGNOSIS — J029 Acute pharyngitis, unspecified: Secondary | ICD-10-CM | POA: Diagnosis present

## 2022-10-09 DIAGNOSIS — R509 Fever, unspecified: Secondary | ICD-10-CM | POA: Insufficient documentation

## 2022-10-09 DIAGNOSIS — Z5321 Procedure and treatment not carried out due to patient leaving prior to being seen by health care provider: Secondary | ICD-10-CM | POA: Diagnosis not present

## 2022-10-09 LAB — GROUP A STREP BY PCR: Group A Strep by PCR: DETECTED — AB

## 2022-10-09 MED ORDER — ACETAMINOPHEN 500 MG PO TABS
ORAL_TABLET | ORAL | Status: AC
  Filled 2022-10-09: qty 2

## 2022-10-09 MED ORDER — ACETAMINOPHEN 500 MG PO TABS
1000.0000 mg | ORAL_TABLET | Freq: Once | ORAL | Status: AC
  Administered 2022-10-09: 1000 mg via ORAL

## 2022-10-09 NOTE — ED Notes (Signed)
Called pt x 1 Registration says she went outside, did not see pt outside

## 2022-10-09 NOTE — ED Notes (Signed)
Called x 2 no answer

## 2022-10-09 NOTE — ED Triage Notes (Signed)
Pt c/o sore throat and fever, some n/v Started yesterday

## 2022-10-10 ENCOUNTER — Ambulatory Visit
Admission: EM | Admit: 2022-10-10 | Discharge: 2022-10-10 | Disposition: A | Discharge status: Home / Self Care | Payer: Medicaid Other | Attending: Nurse Practitioner | Admitting: Nurse Practitioner

## 2022-10-10 DIAGNOSIS — J020 Streptococcal pharyngitis: Secondary | ICD-10-CM | POA: Diagnosis not present

## 2022-10-10 MED ORDER — AMOXICILLIN 500 MG PO CAPS: 500.0000 mg | ORAL_CAPSULE | Freq: Two times a day (BID) | ORAL | 0 refills | Status: AC

## 2022-10-10 NOTE — ED Provider Notes (Addendum)
[facility]    CSN: [id number] Arrival date & time: 10/10/22  1515      History   Chief Complaint Chief Complaint  Patient presents with   Sore Throat    HPI Peggy Savage is a 32 y.o. female  presents for evaluation of URI symptoms for 4 days. Patient reports associated symptoms of sore throat, fever, chills, headache, body aches. Denies N/V/D, shortness of breath, cough, congestion. Patient does not have a hx of asthma or smoking. No known sick contacts.  Pt has taken ibuprofen and Tylenol OTC for symptoms.  She was at [facility] yesterday and was swabbed for strep throat in triage.  The swab was positive but she left before being seen.  Pt has no other concerns at this time.    Sore Throat    Past Medical History:  Diagnosis Date   Anemia    Vitamin D deficiency     Patient Active Problem List   Diagnosis Date Noted   Supervision of other normal pregnancy, antepartum 04/01/2022   Vision changes 02/11/2022   Plantar fasciitis, bilateral 07/21/2021   Intermittent constipation 07/21/2021   Thyroid function study abnormality 03/24/2020   Hx of shoulder dystocia in prior pregnancy, currently pregnant 03/04/2020   Vitamin D deficiency 11/29/2016   Language barrier 11/10/2015    Past Surgical History:  Procedure Laterality Date   NO PAST SURGERIES      OB History     Gravida  4   Para  3   Term  3   Preterm  0   AB  1   Living  3      SAB  0   IAB  0   Ectopic  0   Multiple  0   Live Births  3            Home Medications    Prior to Admission medications   Medication Sig Start Date End Date Taking? Authorizing Provider  amoxicillin (AMOXIL) 500 MG capsule Take 1 capsule (500 mg total) by mouth 2 (two) times daily for 10 days. 10/10/22 10/20/22 Yes Radford Pax, NP  ferrous sulfate 325 (65 FE) MG tablet Take 1 tablet (325 mg total) by mouth daily. Patient not taking: Reported on 09/05/2022 04/19/22   Federico Flake, MD  lidocaine (XYLOCAINE) 2 % solution Use as directed 15 mLs in the mouth or throat every 6 (six) hours as needed (tooth pain). Apply to gum line of affected tooth with q-tip or may gargle and spit, do not swallow the medication 09/22/22   Radford Pax, NP  omega-3 acid ethyl esters (LOVAZA) 1 g capsule Take by mouth 2 (two) times daily.    [provider]  Prenatal Vit-Fe Fumarate-FA (PREPLUS) 27-1 MG TABS Take 1 tablet by mouth daily. Patient not taking: Reported on 09/05/2022 05/18/22   Federico Flake, MD  tranexamic acid (LYSTEDA) 650 MG TABS tablet Take 2 tablets (1,300 mg total) by mouth 3 (three) times daily. Take during menses for a maximum of five days 09/05/22   Lennart Pall, MD  VITAMIN D PO Take by mouth.    [provider]    Family History Family History  Problem Relation Age of Onset   Diabetes Father    Hypertension Father    Diabetes Mother    Hypertension Mother    Heart attack Mother    Heart disease Mother     Social History Social History  Tobacco Use   Smoking status: Never    Passive exposure: Never   Smokeless tobacco: Never  Vaping Use   Vaping Use: Never used  Substance Use Topics   Alcohol use: No   Drug use: No     Allergies   Patient has no known allergies.   Review of Systems Review of Systems  HENT:  Positive for sore throat.      Physical Exam Triage Vital Signs ED Triage Vitals  Enc Vitals Group     BP 10/10/22 1550 107/75     Pulse Rate 10/10/22 1550 (!) 108     Resp 10/10/22 1550 20     Temp 10/10/22 1550 (!) 100.4 F (38 C)     Temp Source 10/10/22 1550 Oral     SpO2 10/10/22 1550 97 %     Weight --      Height --      Head Circumference --      Peak Flow --      Pain Score 10/10/22 1548 9     Pain Loc --      Pain Edu? --      Excl. in GC? --    No data found.  Updated Vital Signs BP 107/75 (BP Location: Right Arm)   Pulse (!) 108   Temp (!) 100.4 F (38 C) (Oral)    Resp 20   LMP 10/09/2022 (Exact Date)   SpO2 97%   Visual Acuity Right Eye Distance:   Left Eye Distance:   Bilateral Distance:    Right Eye Near:   Left Eye Near:    Bilateral Near:     Physical Exam Vitals and nursing note reviewed.  Constitutional:      General: She is not in acute distress.    Appearance: She is well-developed. She is not ill-appearing.  HENT:     Head: Normocephalic and atraumatic.     Right Ear: Tympanic membrane and ear canal normal.     Left Ear: Tympanic membrane and ear canal normal.     Nose: No congestion or rhinorrhea.     Mouth/Throat:     Mouth: Mucous membranes are moist.     Pharynx: Oropharynx is clear. Uvula midline. Posterior oropharyngeal erythema present.     Tonsils: No tonsillar exudate or tonsillar abscesses.  Eyes:     Conjunctiva/sclera: Conjunctivae normal.     Pupils: Pupils are equal, round, and reactive to light.  Cardiovascular:     Rate and Rhythm: Regular rhythm. Tachycardia present.     Heart sounds: Normal heart sounds.     Comments: Mildly tachy in seeing of fever Pulmonary:     Effort: Pulmonary effort is normal.     Breath sounds: Normal breath sounds.  Musculoskeletal:     Cervical back: Normal range of motion and neck supple.  Lymphadenopathy:     Cervical: No cervical adenopathy.  Skin:    General: Skin is warm and dry.  Neurological:     General: No focal deficit present.     Mental Status: She is alert and oriented to person, place, and time.  Psychiatric:        Mood and Affect: Mood normal.        Behavior: Behavior normal.      UC Treatments / Results  Labs (all labs ordered are listed, but only abnormal results are displayed) Labs Reviewed - No data to display  Group A Strep by PCR Order: [id number] Status: Final result  Visible to patient: Yes (seen)     Next appt: None   Specimen Information: Throat; Sterile Swab  0 Result Notes    Component Ref Range & Units 1 d ago  Group A  Strep by PCR NOT DETECTED DETECTED Abnormal   Comment: Performed at [facility], [address] [place], [place] [address]  Resulting Agency [facility]         Specimen Collected: 10/09/22 18:28 Last Resulted: 10/09/22 19:17        EKG   Radiology No results found.  Procedures Procedures (including critical care time)  Medications Ordered in UC Medications - No data to display  Initial Impression / Assessment and Plan / UC Course  I have reviewed the triage vital signs and the nursing notes.  Pertinent labs & imaging results that were available during my care of the patient were reviewed by me and considered in my medical decision making (see chart for details).     Reviewed exam and symptoms with patient.  No red flags.  Strep PCR from yesterday positive.  Start amoxicillin twice daily for 10 days.  Continue ibuprofen or Tylenol.  Salt water gargles and warm liquids.  PCP follow-up if symptoms do not improve.  ER precautions reviewed and patient verbalized understanding. Final Clinical Impressions(s) / UC Diagnoses   Final diagnoses:  Streptococcal sore throat     Discharge Instructions      Start amoxicillin twice daily for 10 days.  Continue Tylenol ibuprofen as needed.  Salt water gargles and warm liquids.  Follow-up with your PCP if your symptoms do not improve.  Please go to the emergency room for any worsening symptoms.  Hope you feel better soon!    ED Prescriptions     Medication Sig Dispense Auth. Provider   amoxicillin (AMOXIL) 500 MG capsule Take 1 capsule (500 mg total) by mouth 2 (two) times daily for 10 days. 20 capsule Radford Pax, NP      PDMP not reviewed this encounter.   Radford Pax, NP 10/10/22 1604    Radford Pax, NP 10/10/22 [phone]

## 2022-10-10 NOTE — ED Triage Notes (Signed)
Pt c/o sore throat x4 days with fever, chills, HA. States hurts to swallow more than water. Was seen at [facility] yesterday and left after triage. Strep test was positive.

## 2022-10-10 NOTE — Discharge Instructions (Signed)
Start amoxicillin twice daily for 10 days.  Continue Tylenol ibuprofen as needed.  Salt water gargles and warm liquids.  Follow-up with your PCP if your symptoms do not improve.  Please go to the emergency room for any worsening symptoms.  Hope you feel better soon!

## 2022-11-24 ENCOUNTER — Other Ambulatory Visit: Payer: Medicaid Other

## 2022-11-24 ENCOUNTER — Ambulatory Visit: Payer: Medicaid Other | Admitting: Obstetrics & Gynecology

## 2022-11-29 ENCOUNTER — Other Ambulatory Visit: Payer: Self-pay | Admitting: Obstetrics and Gynecology

## 2022-11-29 DIAGNOSIS — N939 Abnormal uterine and vaginal bleeding, unspecified: Secondary | ICD-10-CM

## 2022-11-30 ENCOUNTER — Ambulatory Visit: Payer: Medicaid Other | Admitting: Obstetrics & Gynecology

## 2022-11-30 ENCOUNTER — Other Ambulatory Visit: Payer: Medicaid Other

## 2023-02-13 ENCOUNTER — Encounter: Payer: Self-pay | Admitting: Obstetrics & Gynecology

## 2023-02-13 ENCOUNTER — Ambulatory Visit: Payer: Medicaid Other | Admitting: Obstetrics & Gynecology

## 2023-02-13 ENCOUNTER — Ambulatory Visit: Payer: Medicaid Other

## 2023-02-13 VITALS — BP 122/75 | HR 67 | Wt 195.6 lb

## 2023-02-13 DIAGNOSIS — N939 Abnormal uterine and vaginal bleeding, unspecified: Secondary | ICD-10-CM | POA: Diagnosis not present

## 2023-02-13 NOTE — Progress Notes (Signed)
US PELVIC US TA/TV with Sonohysterogram: heterogeneous anteverted uterus with anterior intramural fibroid 1.4 x 1.2 x .5 cm,EEC 22 mm,normal ovaries,no free fluid, Sonohysterogram was preformed by Dr. Charlotta Newton with ultrasound guidance, and surveillance throughout the procedure.  Saline outlined the smooth symmetrical walls,no solitary mass noted within the endometrium.

## 2023-02-13 NOTE — Progress Notes (Signed)
   GYN VISIT Patient name: Peggy Savage MRN [id number]  Date of birth: [birth year] Chief Complaint:   Follow-up (Ultrasound)  History of Present Illness:   Peggy Savage is a 32 y.o. [phone] female being seen today for SHG due to HMB.  In review, for the past several months she has noted that on the first several days of her period, it is very heavy with passage of clots.  Work up completed with Dr. Berton Lan including pelvic US, which showed non-specific findings within endometrium, suggestive of a possible polyp.  Today, she notes that she has been drinking tea with her period and notes improvement of her symptoms.  It sounds as though currently she think is her bleeding is manageable and better than previous.  Patient's last menstrual period was 01/15/2023 (exact date).    Review of Systems:   Pertinent items are noted in HPI Denies fever/chills, dizziness, headaches, visual disturbances, fatigue, shortness of breath, chest pain, abdominal pain, vomiting, no problems with bowel movements, urination, or intercourse unless otherwise stated above.  Pertinent History Reviewed:   Past Surgical History:  Procedure Laterality Date   NO PAST SURGERIES      Past Medical History:  Diagnosis Date   Anemia    Vitamin D deficiency    Reviewed problem list, medications and allergies. Physical Assessment:   Vitals:   02/13/23 1219  BP: 122/75  Pulse: 67  Weight: 195 lb 9.6 oz (88.7 kg)  Body mass index is 31.57 kg/m.       Physical Examination:   General appearance: alert, well appearing, and in no distress  Psych: mood appropriate, normal affect  Skin: warm & dry   Cardiovascular: normal heart rate noted  Respiratory: normal respiratory effort, no distress  Abdomen: soft, non-tender   Pelvic: VULVA: normal appearing vulva with no masses, tenderness or lesions, VAGINA: normal appearing vagina with normal color and discharge, no lesions, CERVIX: normal appearing cervix  without discharge or lesions  Extremities: no edema   Sonohysterogram note:  Pre-operative Diagnosis: 1) Suspected endometrial polyp  Post-operative Diagnosis: same  Procedure Details   The risks (including infection, bleeding, pain, and uterine perforation) and benefits of the procedure were explained to the patient and Written informed consent was obtained.     The patient was placed in the dorsal lithotomy position.  A sterile speculum inserted in the vagina, and the cervix prepped with betadine.     A single tooth tenaculum was applied to the anterior lip of the cervix for stabilization.  An intrauterine catheter was inserted into the cervix without difficulty.  Speculum was removed.  The vaginal transducer was re-inserted and 10cc of saline solution was instilled under direct real-time observation.  See findings within ultrasound report. All instruments were removed.  Pt tolerated procedure without difficulty.    Condition: Stable  Complications: None   Chaperone:  Education officer, community & Plan:  1) HMB -no abnormalities noted on SHG -doing ok with herbal supplements -should she desire further intervention, consider starting the previously prescribed Lysteda or return to see Dr. Berton Lan to review options    Return if symptoms worsen or fail to improve.   Myna Hidalgo, DO Attending Obstetrician & Gynecologist, [facility] for [employer], [facility]

## 2023-03-13 ENCOUNTER — Encounter: Payer: Self-pay | Admitting: Obstetrics and Gynecology

## 2023-03-13 ENCOUNTER — Ambulatory Visit: Payer: Medicaid Other | Admitting: Obstetrics and Gynecology

## 2023-03-13 VITALS — BP 109/70 | HR 71 | Ht 68.0 in | Wt 195.0 lb

## 2023-03-13 DIAGNOSIS — N939 Abnormal uterine and vaginal bleeding, unspecified: Secondary | ICD-10-CM

## 2023-03-13 MED ORDER — TRANEXAMIC ACID 650 MG PO TABS: 1300.0000 mg | ORAL_TABLET | Freq: Three times a day (TID) | ORAL | 11 refills | Status: AC

## 2023-03-13 NOTE — Progress Notes (Signed)
32 y.o. GYN presents for Ultrasound Results.

## 2023-03-13 NOTE — Progress Notes (Signed)
   RETURN GYNECOLOGY VISIT  Subjective:  Peggy Savage is a 32 y.o. [id number] with LMP 02/16/23 presenting for AUB follow up  Heavy periods for ~1 year. Regular cycle, last 7 days but passes clemetine sized clots for the first 4 days. Work up notable for possible endometrial mass (see below) so SHG was performed by Dr. Charlotta Newton on 02/13/23. Thickened EL of 22mm noted but this was 3 days before patient's menstrual cycle. With infusion, the endometrium was smooth, symmetrical without abnormality. She has a small 1.4cm intramural fibroid.   She presents today to review these results. She has not tried lysteda or any medications for her periods.   - Pap 09/22/20 NILM - GC/CT/trichomonas negative 09/05/22 - CBC w/ Hgb 10.8 09/05/22 - TSH 3.070 09/05/22 - Pelvic US 09/14/22: 13.6 x 5.8 x 8.2cm uterus w/ EL 11.4mm and 1.7 x 0.4 x 1.9cm nonspecific echogenic region within the mid aspect of the endometrium representing endometrial proteinaceous material vs endometrial mass    Objective:   Vitals:   03/13/23 1611  BP: 109/70  Pulse: 71  Weight: 195 lb (88.5 kg)  Height: 5\' 8"  (1.727 m)   General:  Alert, oriented and cooperative. Patient is in no acute distress.  Skin: Skin is warm and dry. No rash noted.   Cardiovascular: Normal heart rate noted  Respiratory: Normal respiratory effort, no problems with respiration noted    Assessment and Plan:  Peggy Savage is a 32 y.o. with AUB  Reviewed US findings with patient. EL is thickened but corresponds to where patient was in her cycle. Her workup overall has been reassuring and she does not have any risk factors for endometrial cancer/hyperplasia. Given her concerns, we discussed role for EMB. She is not meeting criteria for sampling, but it is still an option if she prefers.   She has not tried any medications for management yet. We discussed trial of lysteda and/or hormonal medication. She would like to try lysteda only. If persistent HVB,  would recommend EMB at that time.   -     tranexamic acid (LYSTEDA) 650 MG TABS tablet; Take 2 tablets (1,300 mg total) by mouth 3 (three) times daily. Take during menses for a maximum of five days  Return if symptoms worsen or fail to improve.  Lennart Pall, MD

## 2023-04-25 ENCOUNTER — Ambulatory Visit
Admission: RE | Admit: 2023-04-25 | Discharge: 2023-04-25 | Disposition: A | Discharge status: Home / Self Care | Payer: Medicaid Other | Source: Ambulatory Visit | Attending: Family Medicine | Admitting: Family Medicine

## 2023-04-25 VITALS — BP 111/73 | HR 78 | Temp 98.4°F | Resp 18

## 2023-04-25 DIAGNOSIS — N939 Abnormal uterine and vaginal bleeding, unspecified: Secondary | ICD-10-CM

## 2023-04-25 NOTE — ED Triage Notes (Signed)
 Pt presents with c/o vaginal bleeding since 12/28. States her hair is falling more often.   Pt states she feels more tired.

## 2023-04-25 NOTE — Discharge Instructions (Signed)
 Please follow-up with your gynecologist for further treatment and workup of your symptoms

## 2023-04-25 NOTE — ED Provider Notes (Signed)
 [facility]    CSN: [id number] Arrival date & time: 04/25/23  1506      History   Chief Complaint Chief Complaint  Patient presents with   Alopecia   Vaginal Bleeding   Fatigue    HPI Peggy Savage is a 33 y.o. female presents for abnormal uterine bleeding and fatigue.  Patient has a history of this and has been following with her gynecologist for workup and treatment.  States she had a normal menstrual cycle that began on December 28 stopped after 5 days then 1 day later it restarted and has continued since.  States she is passing very small clots.  Denies any abdominal pain or cramping.  No fevers or chills.  No dysuria.  Denies pregnancy risk.  GYN note from November 25 visit shows that they prescribed her tranexamic acid .  Patient states it makes her vomit so she only took 1 dose.  Note also reports possible hormonal treatment/birth control versus endometrial biopsy.  Patient has not contacted her gynecologist to make her aware of her current symptoms or that she cannot tolerate that medication.  She would like an ultrasound as she was told she has a fibroid and wants to be sure that is not what is causing her symptoms.  Also reports she has been losing some of her hair.  No other concerns at this time.   Vaginal Bleeding   Past Medical History:  Diagnosis Date   Anemia    Vitamin D  deficiency     Patient Active Problem List   Diagnosis Date Noted   Supervision of other normal pregnancy, antepartum 04/01/2022   Vision changes 02/11/2022   Plantar fasciitis, bilateral 07/21/2021   Intermittent constipation 07/21/2021   Thyroid function study abnormality 03/24/2020   Hx of shoulder dystocia in prior pregnancy, currently pregnant 03/04/2020   Vitamin D  deficiency 11/29/2016   Language barrier 11/10/2015    Past Surgical History:  Procedure Laterality Date   NO PAST SURGERIES      OB History     Gravida  4   Para  3   Term  3   Preterm  0   AB   1   Living  3      SAB  0   IAB  0   Ectopic  0   Multiple  0   Live Births  3            Home Medications    Prior to Admission medications   Medication Sig Start Date End Date Taking? Authorizing Provider  ferrous sulfate  325 (65 FE) MG tablet Take 1 tablet (325 mg total) by mouth daily. Patient not taking: Reported on 09/05/2022 04/19/22   Eldonna Suzen Octave, MD  lidocaine  (XYLOCAINE ) 2 % solution Use as directed 15 mLs in the mouth or throat every 6 (six) hours as needed (tooth pain). Apply to gum line of affected tooth with q-tip or may gargle and spit, do not swallow the medication Patient not taking: Reported on 02/13/2023 09/22/22   Lenya Sterne, Jodi R, NP  omega-3 acid ethyl esters (LOVAZA) 1 g capsule Take by mouth 2 (two) times daily.    [provider]  Prenatal Vit-Fe Fumarate-FA (PREPLUS) 27-1 MG TABS Take 1 tablet by mouth daily. Patient not taking: Reported on 09/05/2022 05/18/22   Eldonna Suzen Octave, MD  tranexamic acid  (LYSTEDA ) 650 MG TABS tablet Take 2 tablets (1,300 mg total) by mouth 3 (three) times daily. Take during menses for a  maximum of five days 03/13/23   Erik Kieth BROCKS, MD  VITAMIN D  PO Take by mouth.    [provider]    Family History Family History  Problem Relation Age of Onset   Diabetes Father    Hypertension Father    Diabetes Mother    Hypertension Mother    Heart attack Mother    Heart disease Mother     Social History Social History   Tobacco Use   Smoking status: Never    Passive exposure: Never   Smokeless tobacco: Never  Vaping Use   Vaping status: Never Used  Substance Use Topics   Alcohol use: No   Drug use: No     Allergies   Patient has no known allergies.   Review of Systems Review of Systems  Genitourinary:  Positive for vaginal bleeding.     Physical Exam Triage Vital Signs ED Triage Vitals [04/25/23 1512]  Encounter Vitals Group     BP 111/73     Systolic BP Percentile       Diastolic BP Percentile      Pulse Rate 78     Resp 18     Temp 98.4 F (36.9 C)     Temp Source Oral     SpO2 97 %     Weight      Height      Head Circumference      Peak Flow      Pain Score 0     Pain Loc      Pain Education      Exclude from Growth Chart    No data found.  Updated Vital Signs BP 111/73 (BP Location: Right Arm)   Pulse 78   Temp 98.4 F (36.9 C) (Oral)   Resp 18   LMP 04/15/2023 (Exact Date)   SpO2 97%   Visual Acuity Right Eye Distance:   Left Eye Distance:   Bilateral Distance:    Right Eye Near:   Left Eye Near:    Bilateral Near:     Physical Exam Vitals and nursing note reviewed.  Constitutional:      General: She is not in acute distress.    Appearance: Normal appearance. She is not ill-appearing.  HENT:     Head: Normocephalic and atraumatic.  Eyes:     Pupils: Pupils are equal, round, and reactive to light.  Cardiovascular:     Rate and Rhythm: Normal rate.  Pulmonary:     Effort: Pulmonary effort is normal.  Abdominal:     Tenderness: There is no right CVA tenderness or left CVA tenderness.  Skin:    General: Skin is warm and dry.  Neurological:     General: No focal deficit present.     Mental Status: She is alert and oriented to person, place, and time.  Psychiatric:        Mood and Affect: Mood normal.        Behavior: Behavior normal.      UC Treatments / Results  Labs (all labs ordered are listed, but only abnormal results are displayed) Labs Reviewed - No data to display  EKG   Radiology No results found.  Procedures Procedures (including critical care time)  Medications Ordered in UC Medications - No data to display  Initial Impression / Assessment and Plan / UC Course  I have reviewed the triage vital signs and the nursing notes.  Pertinent labs & imaging results that were available during  my care of the patient were reviewed by me and considered in my medical decision making (see chart for  details).     I reviewed patient's concerns as well as limitations and abilities of urgent care.  She declines urine pregnancy.  Advised no ultrasound available and that given these are persistent symptoms that are already being worked up with her gynecologist, that she should reach out to her gynecologist for further treatment/workup. Discussed multiple causes of alopecia and advised PCP follow-up versus doing over-the-counter treatment options such as Rogaine.  Patient verbalized understanding and will contact her GYN.  ER precautions reviewed and patient verbalized understanding. Final Clinical Impressions(s) / UC Diagnoses   Final diagnoses:  Abnormal uterine bleeding     Discharge Instructions      Please follow-up with your gynecologist for further treatment and workup of your symptoms    ED Prescriptions   None    PDMP not reviewed this encounter.   Loreda Myla SAUNDERS, NP 04/25/23 1555

## 2024-02-13 ENCOUNTER — Other Ambulatory Visit (HOSPITAL_COMMUNITY)
Admission: RE | Admit: 2024-02-13 | Discharge: 2024-02-13 | Disposition: A | Discharge status: Home / Self Care | Source: Ambulatory Visit | Attending: Obstetrics and Gynecology | Admitting: Obstetrics and Gynecology

## 2024-02-13 ENCOUNTER — Ambulatory Visit: Admitting: Obstetrics and Gynecology

## 2024-02-13 ENCOUNTER — Encounter: Payer: Self-pay | Admitting: Obstetrics and Gynecology

## 2024-02-13 ENCOUNTER — Other Ambulatory Visit: Payer: Self-pay | Admitting: Obstetrics and Gynecology

## 2024-02-13 VITALS — BP 111/75 | HR 81 | Ht 68.0 in | Wt 198.4 lb

## 2024-02-13 DIAGNOSIS — Z124 Encounter for screening for malignant neoplasm of cervix: Secondary | ICD-10-CM | POA: Insufficient documentation

## 2024-02-13 DIAGNOSIS — N6314 Unspecified lump in the right breast, lower inner quadrant: Secondary | ICD-10-CM

## 2024-02-13 DIAGNOSIS — Z01419 Encounter for gynecological examination (general) (routine) without abnormal findings: Secondary | ICD-10-CM | POA: Insufficient documentation

## 2024-02-13 NOTE — Progress Notes (Signed)
 ANNUAL EXAM Patient name: Peggy Savage MRN [id number]  Date of birth: 09/18/1990 Chief Complaint:   Annual Exam  History of Present Illness:   Peggy Savage is a 33 y.o. [phone] with Patient's last menstrual period was 01/31/2024 (exact date). being seen today for a routine annual exam.  Current complaints:  None Periods are better since starting lysteda   Last pap 09/22/20. Results were: NILM w/ HRHPV not done. H/O abnormal pap: no Last mammogram: n/a Family h/o breast cancer: no Last colonoscopy: n/a Family h/o colorectal cancer: no HPV vaccine: no     09/05/2022    3:13 PM 04/01/2022    8:35 AM  Depression screen PHQ 2/9  Decreased Interest 0 0  Down, Depressed, Hopeless 0 0  PHQ - 2 Score 0 0  Altered sleeping 0 0  Tired, decreased energy 0 0  Change in appetite 0 0  Feeling bad or failure about yourself  0 0  Trouble concentrating 0 0  Moving slowly or fidgety/restless 0 0  Suicidal thoughts 0 0  PHQ-9 Score 0 0        09/05/2022    3:13 PM 04/01/2022    8:36 AM  GAD 7 : Generalized Anxiety Score  Nervous, Anxious, on Edge 0 0  Control/stop worrying 0 0  Worry too much - different things 0 0  Trouble relaxing 0 0  Restless 0 0  Easily annoyed or irritable 0 0  Afraid - awful might happen 0 0  Total GAD 7 Score 0 0  Anxiety Difficulty Not difficult at all      Review of Systems:   Pertinent items are noted in HPI Denies any headaches, blurred vision, fatigue, shortness of breath, chest pain, abdominal pain, abnormal vaginal discharge/itching/odor/irritation, problems with periods, bowel movements, urination, or intercourse unless otherwise stated above. Pertinent History Reviewed:  Reviewed past medical,surgical, social and family history.  Reviewed problem list, medications and allergies. Physical Assessment:   Vitals:   02/13/24 1032  BP: 111/75  Pulse: 81  Weight: 198 lb 6.4 oz (90 kg)  Height: 5' 8 (1.727 m)  Body mass index is 30.17  kg/m.        Physical Examination:   General appearance - well appearing, and in no distress  Mental status - alert, oriented to person, place, and time  Chest - respiratory effort normal  Heart - normal peripheral perfusion  Breasts - breasts appear normal. On right breast at approximately 6 o'clock immediately inferior to the areola she has a round slightly firm 1cm mass that is mobile. No axillary LAD. Left breast normal  Abdomen - soft, nontender, nondistended, no masses or organomegaly  Pelvic - VULVA: normal appearing vulva with no masses, tenderness or lesions  VAGINA: normal appearing vagina with normal color and discharge, no lesions  CERVIX: normal appearing cervix without discharge or lesions  Thin prep pap is done with HR HPV cotesting  Chaperone present for exam  No results found for this or any previous visit (from the past 24 hours).  Assessment & Plan:  1) Well-Woman Exam Mammogram: Given exam findings, breast imaging ordered today. 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 She has had imaging of that breast in 2021 but the area of concern was retroareolar at 10 o'clock, so we will get imaging to confirm benign etiology. Colonoscopy: @ 35yo, or sooner if problems Pap: Collected Gardasil: Considering GC/CT/HIV/HCV: up to date Declines contraception Declines flu shot  Labs/procedures today:   Orders  Placed This Encounter  Procedures   US  BREAST COMPLETE UNI RIGHT INC AXILLA    Meds: No orders of the defined types were placed in this encounter.  Follow-up: Return in about 1 year (around 02/12/2025) for annual exam or sooner as needed.  Peggy JAYSON Carolin, MD 02/13/2024 11:24 AM

## 2024-02-13 NOTE — Progress Notes (Signed)
 Pt presents for annual. Pt has no questions or concerns at this time.

## 2024-02-14 ENCOUNTER — Ambulatory Visit: Payer: Self-pay | Admitting: Obstetrics and Gynecology

## 2024-02-14 LAB — CYTOLOGY - PAP
Adequacy: ABSENT
Comment: NEGATIVE
Diagnosis: NEGATIVE
High risk HPV: NEGATIVE

## 2024-02-21 ENCOUNTER — Ambulatory Visit
Admission: RE | Admit: 2024-02-21 | Discharge: 2024-02-21 | Disposition: A | Source: Ambulatory Visit | Attending: Obstetrics and Gynecology

## 2024-02-21 ENCOUNTER — Other Ambulatory Visit

## 2024-02-21 DIAGNOSIS — N6314 Unspecified lump in the right breast, lower inner quadrant: Secondary | ICD-10-CM

## 2024-02-22 ENCOUNTER — Inpatient Hospital Stay: Admission: RE | Admit: 2024-02-22 | Discharge: 2024-02-22 | Attending: Obstetrics and Gynecology

## 2024-02-22 ENCOUNTER — Other Ambulatory Visit: Payer: Self-pay | Admitting: Obstetrics and Gynecology

## 2024-02-22 ENCOUNTER — Ambulatory Visit: Payer: Self-pay | Admitting: Obstetrics and Gynecology

## 2024-02-22 ENCOUNTER — Ambulatory Visit
Admission: RE | Admit: 2024-02-22 | Discharge: 2024-02-22 | Disposition: A | Discharge status: Home / Self Care | Source: Ambulatory Visit | Attending: Obstetrics and Gynecology | Admitting: Obstetrics and Gynecology

## 2024-02-22 DIAGNOSIS — R928 Other abnormal and inconclusive findings on diagnostic imaging of breast: Secondary | ICD-10-CM

## 2024-02-22 DIAGNOSIS — N6314 Unspecified lump in the right breast, lower inner quadrant: Secondary | ICD-10-CM

## 2024-03-01 ENCOUNTER — Encounter

## 2024-03-01 ENCOUNTER — Other Ambulatory Visit

## 2024-08-23 ENCOUNTER — Other Ambulatory Visit
